# Patient Record
Sex: Male | Born: 1975 | Race: White | Hispanic: No | Marital: Single | Smoking: Current every day smoker
Health system: Southern US, Community
[De-identification: ages and names within clinical notes are randomized; demographics above are authoritative.]

## PROBLEM LIST (undated history)

## (undated) DIAGNOSIS — G2401 Drug induced subacute dyskinesia: Secondary | ICD-10-CM

## (undated) DIAGNOSIS — F209 Schizophrenia, unspecified: Secondary | ICD-10-CM

## (undated) DIAGNOSIS — F1910 Other psychoactive substance abuse, uncomplicated: Secondary | ICD-10-CM

## (undated) DIAGNOSIS — I10 Essential (primary) hypertension: Secondary | ICD-10-CM

---
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
## 2003-12-20 ENCOUNTER — Emergency Department (HOSPITAL_COMMUNITY): Admission: EM | Admit: 2003-12-20 | Discharge: 2003-12-21 | Payer: Self-pay | Admitting: Emergency Medicine

## 2004-04-14 ENCOUNTER — Emergency Department (HOSPITAL_COMMUNITY): Admission: EM | Admit: 2004-04-14 | Discharge: 2004-04-15 | Payer: Self-pay | Admitting: Emergency Medicine

## 2004-04-15 ENCOUNTER — Emergency Department (HOSPITAL_COMMUNITY): Admission: EM | Admit: 2004-04-15 | Discharge: 2004-04-16 | Payer: Self-pay | Admitting: Emergency Medicine

## 2004-04-21 ENCOUNTER — Inpatient Hospital Stay (HOSPITAL_COMMUNITY): Admission: EM | Admit: 2004-04-21 | Discharge: 2004-04-23 | Payer: Self-pay | Admitting: Emergency Medicine

## 2004-04-21 ENCOUNTER — Encounter: Admission: RE | Admit: 2004-04-21 | Discharge: 2004-04-21 | Payer: Self-pay | Admitting: Family Medicine

## 2005-07-31 IMAGING — CT CT PELVIS W/ CM
1 of 4 series · 14 of 32 positions shown, 19 images · IV contrast (APPLIED)
Comparison: none

CLINICAL DATA: nausea and vomiting; abdominal pain
 CT ABDOMEN AND PELVIS WITH CONTRAST
 Multidetector helical CT imaging abdomen and pelvis performed following diluted oral contrast and 453cc Omnipaque 300.  No prior exam for comparison.  
 CT ABDOMEN

[Series 2: abd/pelvis 5.0 b30f · axial · 0.74mm/px · z∈[-498,-43]mm · 14 of 103 slices shown, 19 images]
[im 6/103  soft-tissue]
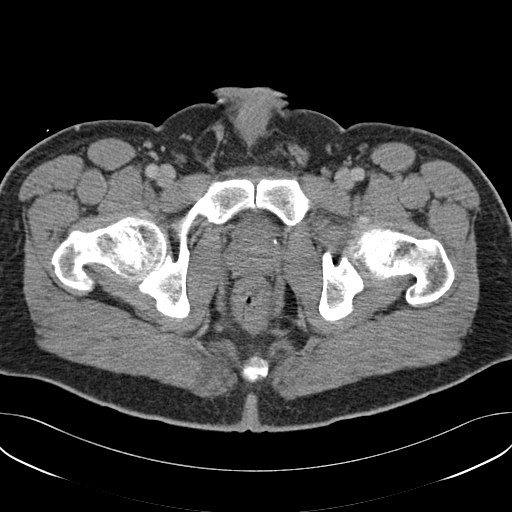
[im 6/103  bone]
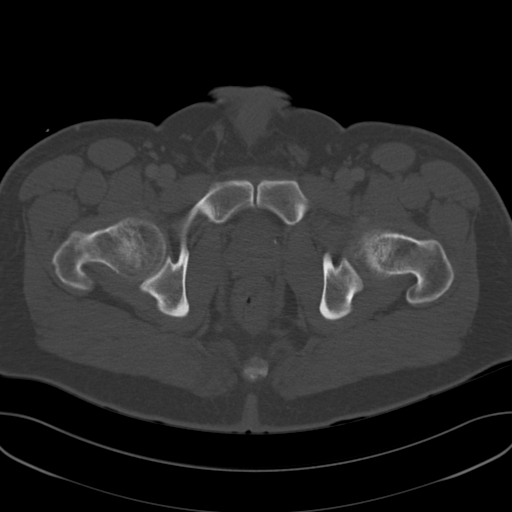
[im 17/103  soft-tissue]
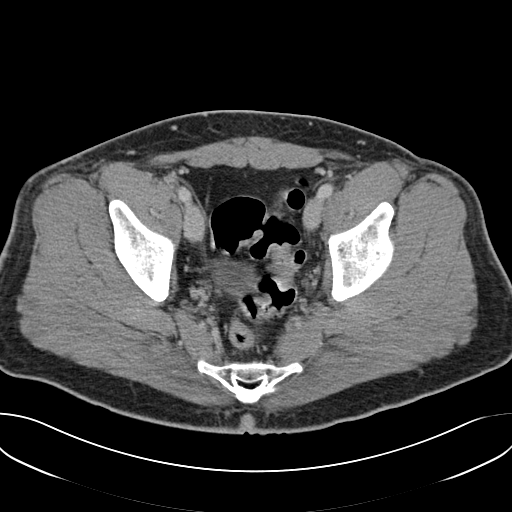
[im 22/103  soft-tissue]
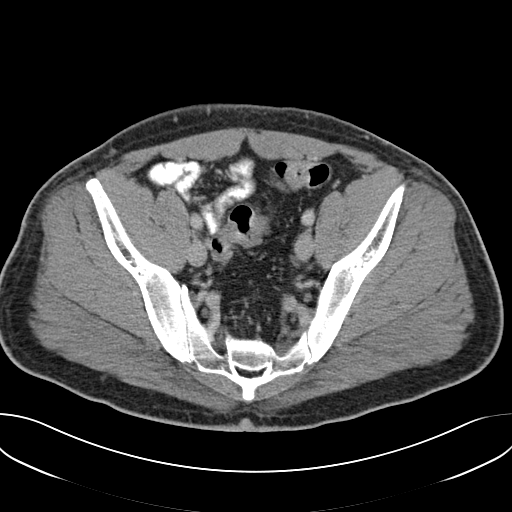
[im 27/103  soft-tissue]
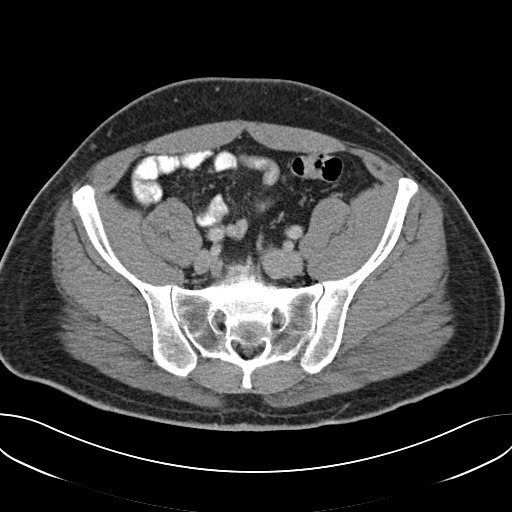
[im 38/103  soft-tissue]
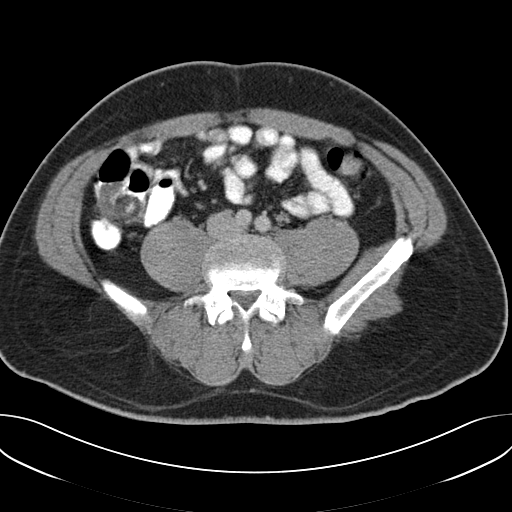
[im 43/103  soft-tissue]
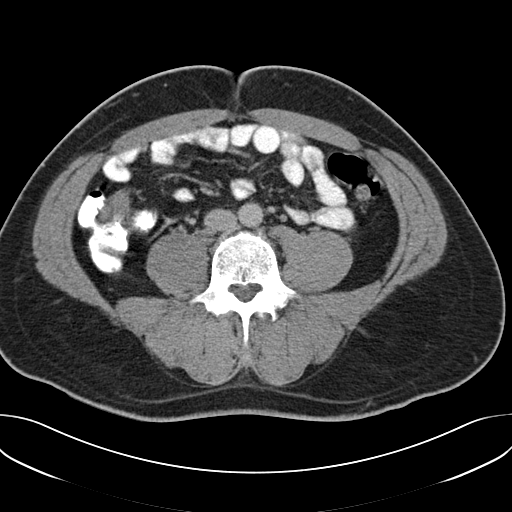
[im 54/103  soft-tissue]
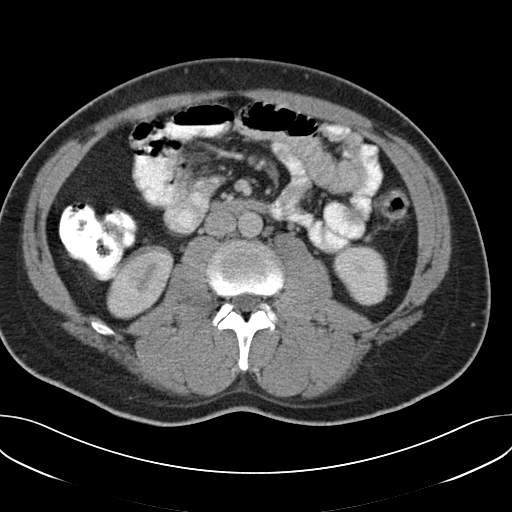
[im 60/103  soft-tissue]
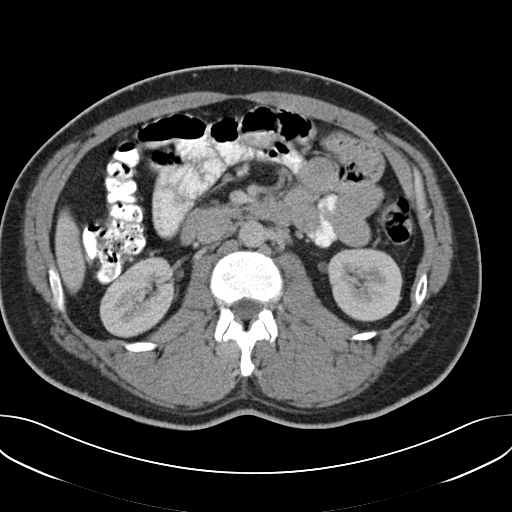
[im 65/103  soft-tissue]
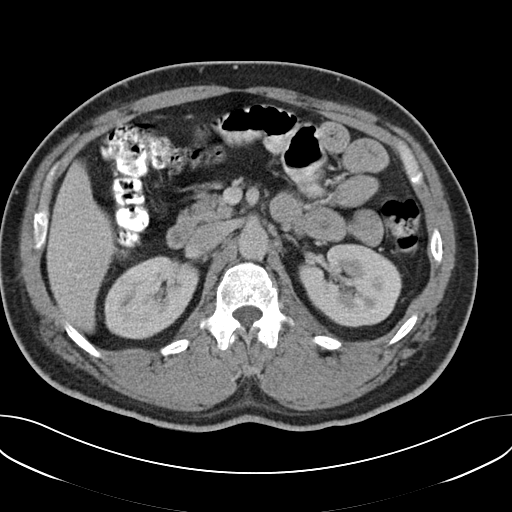
[im 65/103  bone]
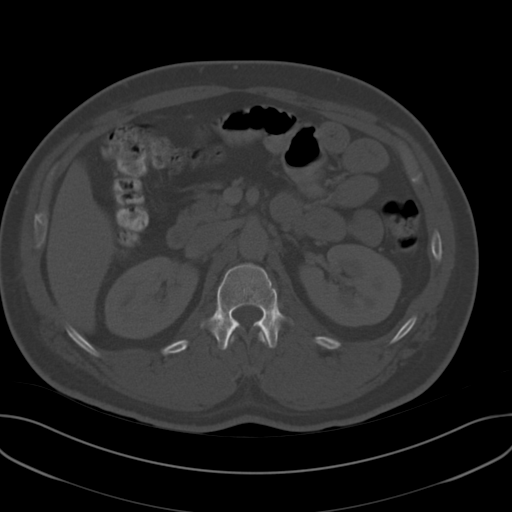
[im 76/103  soft-tissue]
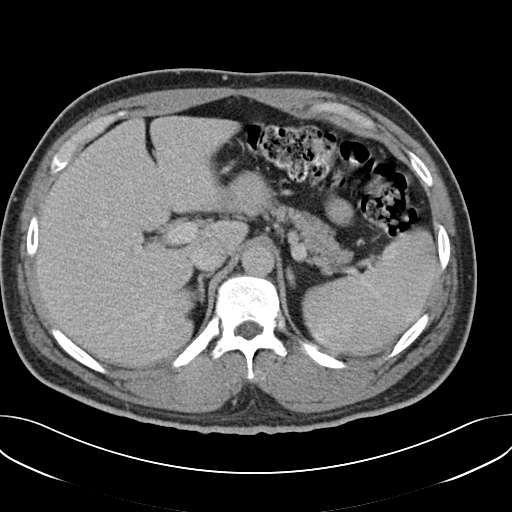
[im 81/103  soft-tissue]
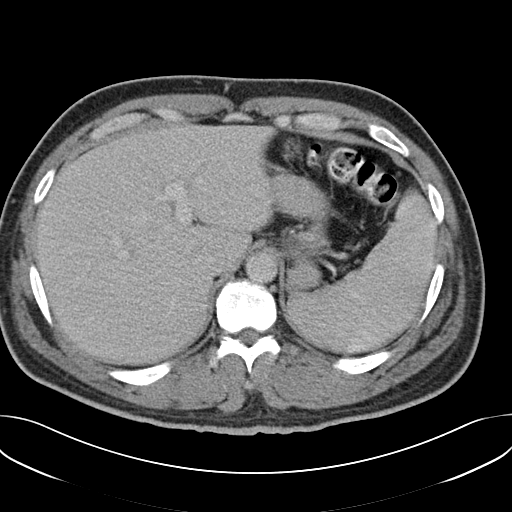
[im 81/103  lung]
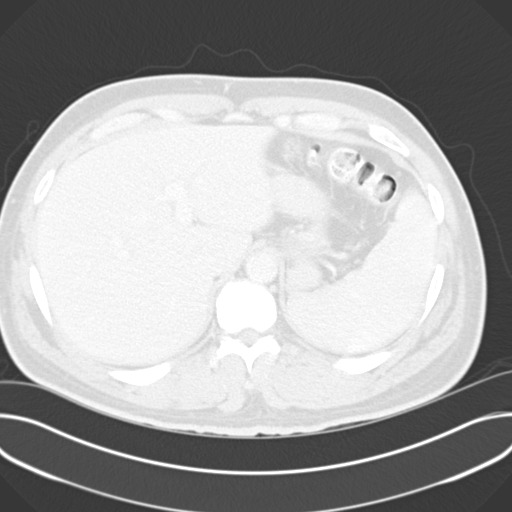
[im 86/103  soft-tissue]
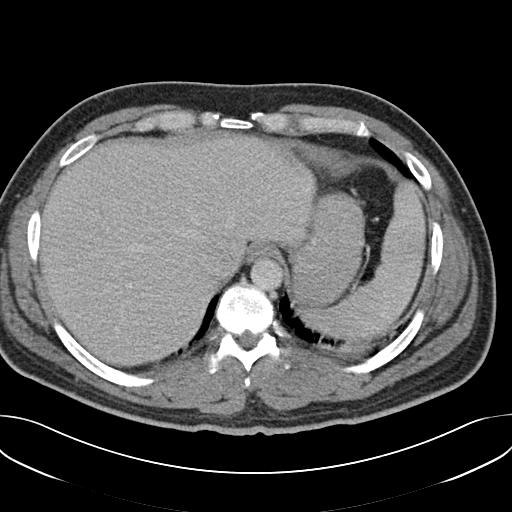
[im 86/103  lung]
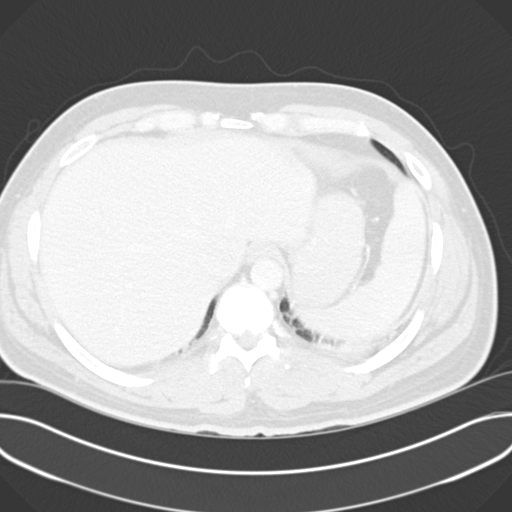
[im 92/103  lung]
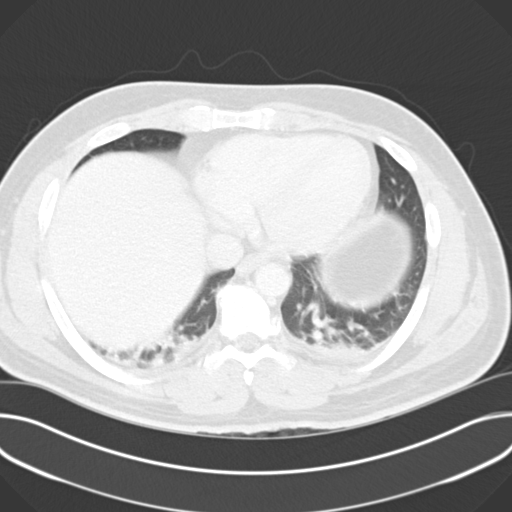
[im 97/103  soft-tissue]
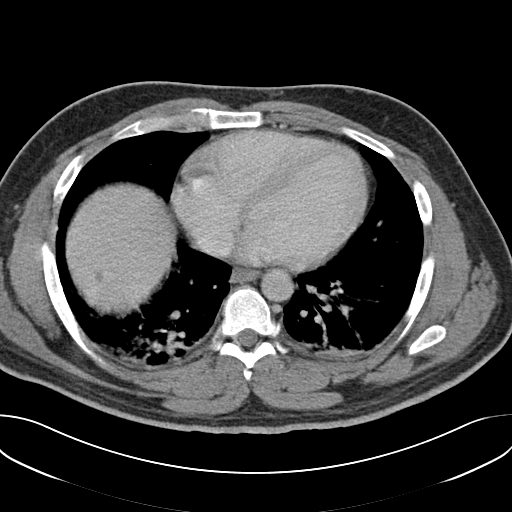
[im 97/103  lung]
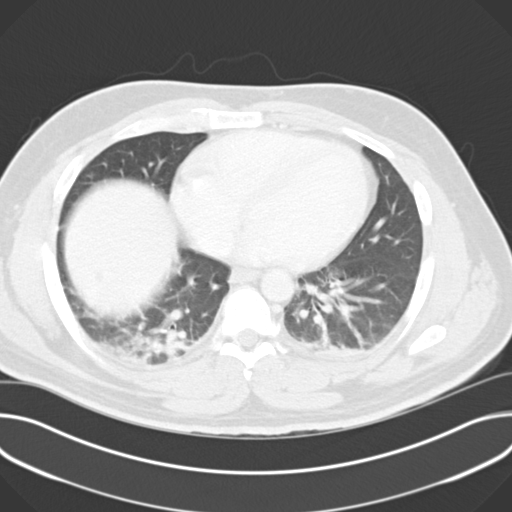

[14 of 32 positions shown; findings below may reference images not displayed]

FINDINGS: Bibasilar atelectasis right lower lobe greater than left lower lobe.  Question mildly inhomogeneous right nephrogram.  Recommend correlation with urinalysis to exclude pyelonephritis.  Liver, spleen, pancreas, kidneys and adrenal glands otherwise unremarkable.  No upper abdominal mass, adenopathy or free fluid.  No inflammatory process seen.
 IMPRESSION
 Bibasilar atelectasis.  Question minimally patchy right nephrogram, recommend correlation with urinalysis to exclude urinary tract infection/pyelonephritis.  
 CT PELVIS
 Normal appendix.  No pelvic mass, adenopathy or free fluid.  Pelvic bowel loops unremarkable.  
 IMPRESSION
 No acute abnormalities.

## 2007-01-31 DIAGNOSIS — G43909 Migraine, unspecified, not intractable, without status migrainosus: Secondary | ICD-10-CM | POA: Insufficient documentation

## 2007-01-31 DIAGNOSIS — I10 Essential (primary) hypertension: Secondary | ICD-10-CM

## 2007-01-31 DIAGNOSIS — K219 Gastro-esophageal reflux disease without esophagitis: Secondary | ICD-10-CM | POA: Insufficient documentation

## 2009-04-06 ENCOUNTER — Emergency Department (HOSPITAL_COMMUNITY): Admission: EM | Admit: 2009-04-06 | Discharge: 2009-04-07 | Payer: Self-pay | Admitting: *Deleted

## 2009-05-07 ENCOUNTER — Emergency Department (HOSPITAL_COMMUNITY): Admission: EM | Admit: 2009-05-07 | Discharge: 2009-05-08 | Payer: Self-pay | Admitting: Emergency Medicine

## 2009-05-08 ENCOUNTER — Inpatient Hospital Stay (HOSPITAL_COMMUNITY): Admission: AD | Admit: 2009-05-08 | Discharge: 2009-05-13 | Payer: Self-pay | Admitting: Psychiatry

## 2009-05-08 ENCOUNTER — Ambulatory Visit: Payer: Self-pay | Admitting: Psychiatry

## 2011-03-13 LAB — COMPREHENSIVE METABOLIC PANEL
ALT: 41 U/L (ref 0–53)
Albumin: 4 g/dL (ref 3.5–5.2)
BUN: 11 mg/dL (ref 6–23)
Calcium: 9.5 mg/dL (ref 8.4–10.5)
GFR calc Af Amer: >60 mL/min (ref >60)
Glucose, Bld: 104 mg/dL — ABNORMAL HIGH (ref 70–99)
Potassium: 4.1 mEq/L (ref 3.5–5.1)
Total Protein: 6.6 g/dL (ref 6.0–8.3)

## 2011-03-13 LAB — DIFFERENTIAL
Eosinophils Absolute: 0.3 10*3/uL (ref 0.0–0.7)
Lymphocytes Relative: 26 % (ref 12–46)
Monocytes Absolute: 0.8 10*3/uL (ref 0.1–1.0)
Monocytes Relative: 8 % (ref 3–12)
Neutro Abs: 6.7 10*3/uL (ref 1.7–7.7)

## 2011-03-13 LAB — PLATELET COUNT: Platelets: 270 10*3/uL (ref 150–400)

## 2011-03-13 LAB — CBC
MCHC: 33.7 g/dL (ref 30.0–36.0)
MCV: 91.5 fL (ref 78.0–100.0)
RBC: 4.39 MIL/uL (ref 4.22–5.81)
RDW: 13.3 % (ref 11.5–15.5)

## 2011-03-13 LAB — RAPID URINE DRUG SCREEN, HOSP PERFORMED
Amphetamines: NOT DETECTED
Tetrahydrocannabinol: NOT DETECTED

## 2011-04-21 NOTE — Discharge Summary (Signed)
NAME:  Cole Henderson, Cole Henderson NO.:  [id number]   MEDICAL RECORD NO.:  [id number]          PATIENT TYPE:  IPS   LOCATION:  0401                          FACILITY:  [facility]   PHYSICIAN:  Anselm Jungling, MD  DATE OF BIRTH:  [birth year]   DATE OF ADMISSION:  05/08/2009  DATE OF DISCHARGE:  05/13/2009                               DISCHARGE SUMMARY   IDENTIFYING DATA AND REASON FOR ADMISSION:  This is an inpatient  psychiatric admission for Cole Henderson, a 35 year old single Caucasian male who  was admitted due to exacerbation of psychotic symptoms.  Please refer to  the admission note for further details pertaining to the symptoms,  circumstances and history that led to his hospitalization.  He was given  an initial Axis I diagnosis of psychosis NOS.   MEDICAL AND LABORATORY:  The patient was medically and physically  assessed by the psychiatric nurse practitioner.  He was continued on his  usual Pepcid 20 mg b.i.d. for GERD.  Otherwise there were no significant  medical issues.   HOSPITAL COURSE:  The patient was admitted to the adult inpatient  psychiatric service.  He presented as a well-nourished, normally-  developed male who was alert, fully oriented, pleasant and cooperative.  He appeared to be nonpsychotic.  He was sad, with depressed affect, but  denied suicidal ideation.  He verbalized a strong desire for help.   He explained that his recent medication change from Geodon to Saphris  did not go well.  He indicated that the change from Geodon to Saphris  had been made in part because of problems with affordability.   Because of the history of a good response to Geodon, Geodon was  restarted and titrated upward to an ultimate dose of 80 mg q.a.m. and  160 mg q.h.s.  The patient reported excellent response to this, and he  was very pleased with the results.   Family session occurred on the day prior to discharge, involving his  parents.  They were very supportive.   Discharge and aftercare plans were  discussed at length.  The patient agreed to the following aftercare  plan.   AFTERCARE:  The patient was to follow up at [facility] with an appointment to see their intake worker on May 18, 2009  at 10:20 a.m.   DISCHARGE MEDICATIONS:  Sinequan 100 mg q.h.s., BuSpar 15 mg q.h.s.  Prolixin decanoate 37.5 mg IM q. 14 days, next May 18, 2009, which was  a continuation of part of his previous regimen.  Tegretol 200 mg twice  daily, Geodon 80 mg q.a.m. and 160 mg q.p.m., Artane 5 mg b.i.d., and  Pepcid 20 mg b.i.d.   DISCHARGE DIAGNOSES:  AXIS I:  Schizophrenia, chronic undifferentiated  type, not otherwise specified.  AXIS II:  Deferred.  AXIS III:  Gastroesophageal reflux disease.  AXIS IV:  Stressors, severe.  AXIS V:  Global assessment of functioning on discharge 65.      Anselm Jungling, MD  Electronically Signed     SPB/MEDQ  D:  05/14/2009  T:  05/14/2009  Job:  [id number]

## 2011-04-21 NOTE — Discharge Summary (Signed)
NAMEGERARD, Cole Henderson NO.:  [id number]   MEDICAL RECORD NO.:  [id number]                   PATIENT TYPE:  INP   LOCATION:  5705                                 FACILITY:  [facility]   PHYSICIAN:  Barney Drain, M.D.                 DATE OF BIRTH:  1976-05-11   DATE OF ADMISSION:  04/21/2004  DATE OF DISCHARGE:  04/23/2004                                 DISCHARGE SUMMARY   ATTENDING PHYSICIAN:  Santiago Bumpers. Hensel, M.D.   DISCHARGE DIAGNOSES:  1. Antral gastritis.  2. Abdominal muscle strain.  3. Anxiety.  4. Tobacco abuse.  5. Hypertension.  6. Hemorrhoids.   DISCHARGE MEDICATIONS:  1. Prilosec OTC one tablet p.o. daily.  2. Vistaril 50 mg p.o. q.8h. p.r.n. severe anxiety.  3. Anusol suppository q.h. night for hemorrhoids.  4. Lisinopril 20 mg one tablet p.o. daily for blood pressure.   SPECIAL INSTRUCTIONS:  1. The patient instructed in supportive therapy for abdominal muscle strain     including cool compresses or heating pad and decreased activity.  2. Diet:  Low acid, decreased spicy and fried, fatty foods.  3. Smoking cessation counseling provided.   DISPOSITION:  The patient discharged home in the care of his family in  stable condition.   FOLLOW UP:  The patient instructed to call on Apr 25, 2004, to schedule a  follow-up appointment with Dr. Olene Floss at the [facility] in three  weeks, telephone number [phone].   BRIEF HISTORY AND PHYSICAL:  Cole Henderson was evaluated in the [facility] when he presented for a new patient visit complaining of two  week duration of intractable nausea and vomiting and one week of diarrhea  with severe right upper quadrant pain.  He had been admitted recently at  [facility] and he was worked up for  pancreatitis, cholecystitis and cholelithiasis. These studies were negative.  The patient states that at the time of presentation, he had had one day  duration  of hematemesis and hematochezia with severe right upper quadrant  pain that radiated to his right back and epigastrium that was worse with  movement or any oral intake.   PHYSICAL EXAMINATION:  GENERAL APPEARANCE:  The patient was poorly kept and  anxious.  VITAL SIGNS:  Temperature 97.5, heart rate 113, blood pressure 117/82.  ABDOMEN:  Normal active bowel sounds, nondistended, soft with right upper  quadrant tenderness without guarding or rebound.  The patient's abdomen was  tender throughout but to a lesser degree.  No hepatosplenomegaly, no masses.  Guaiac was negative.  The patient had normal rectal tone.   PERTINENT LABORATORY DATA:  Findings on admission with lipase 26, amylase  41.  White count 8.4, hemoglobin 14.3.  AST 15, ALT 21.   HOSPITAL COURSE:  PROBLEM #1 -  ABDOMINAL PAIN:  The patient has had recent  negative  abdominal ultrasound, abdominal CT and ERCP.  LFTs, amylase and  lipase were within normal limits.  With this history of alcohol abuse,  gastroesophageal reflux disease and current smoking, with post prandial pain  and negative other work-up, peptic ulcer disease is leading differential  diagnosis.  Therefore gastroenterology was consulted.  Dr. Leone Payor performed  an EGD and anoscopy on Apr 22, 2004.  These studies revealed antral  gastritis and hemorrhoids. Dr. Leone Payor confirmed that proton pump inhibitor  therapy would be appropriate for the patient and also provided [facility] for  the patient's comfort.  Consulted the patient extensively and appropriate  dietary and lifestyle modifications that would be needed to decrease his  gastritis.  The patient did not follow these recommendations even with  regular reminder from nursing staff and myself even during this  hospitalization.   PROBLEM #2 -  ABDOMINAL MUSCLE STRAIN:  The patient continued to complain of  right upper quadrant discomfort; however, when patient was distracted on  physical examination, his  examination was normal and no pain was elicited  with palpation.  Counseled the patient in appropriate supportive therapy for  this complaint.   PROBLEM #3 -  ANXIETY:  The patient has recently lost his job and he is  concerned about his finances.  Routine screening was not consistent with  depression.  The patient had responded well to p.r.n. Vistaril while in the  hospital and I sent him home with a limited amount of Vistaril during this  time of acute stress and referred him to family services for further  counseling.   PROBLEM #4 -  HYPERTENSION:  Well controlled on Lisinopril therapy.  Will  continue.   PROBLEM #5 -  TOBACCO ABUSE:  Counseled the patient on smoking cessation.  The patient is in precontemplation stage.                                                Barney Drain, M.D.    SS/MEDQ  D:  04/23/2004  T:  04/25/2004  Job:  [id number]   cc:   Iva Boop, M.D. [facility] [address]   Chrissie Noa A. Leveda Anna, M.D.  Fax: [phone]

## 2019-08-08 ENCOUNTER — Encounter (HOSPITAL_COMMUNITY): Payer: Self-pay | Admitting: *Deleted

## 2019-08-08 ENCOUNTER — Other Ambulatory Visit: Payer: Self-pay

## 2019-08-08 ENCOUNTER — Emergency Department (HOSPITAL_COMMUNITY)
Admission: EM | Admit: 2019-08-08 | Discharge: 2019-08-08 | Disposition: A | Discharge status: Home / Self Care | Payer: Self-pay | Attending: Emergency Medicine | Admitting: Emergency Medicine

## 2019-08-08 DIAGNOSIS — F17200 Nicotine dependence, unspecified, uncomplicated: Secondary | ICD-10-CM | POA: Insufficient documentation

## 2019-08-08 DIAGNOSIS — I10 Essential (primary) hypertension: Secondary | ICD-10-CM | POA: Insufficient documentation

## 2019-08-08 DIAGNOSIS — R51 Headache: Secondary | ICD-10-CM | POA: Insufficient documentation

## 2019-08-08 DIAGNOSIS — R519 Headache, unspecified: Secondary | ICD-10-CM

## 2019-08-08 DIAGNOSIS — H6122 Impacted cerumen, left ear: Secondary | ICD-10-CM | POA: Insufficient documentation

## 2019-08-08 DIAGNOSIS — F419 Anxiety disorder, unspecified: Secondary | ICD-10-CM | POA: Insufficient documentation

## 2019-08-08 HISTORY — DX: Essential (primary) hypertension: I10

## 2019-08-08 HISTORY — DX: Drug induced subacute dyskinesia: G24.01

## 2019-08-08 HISTORY — DX: Schizophrenia, unspecified: F20.9

## 2019-08-08 MED ORDER — DIPHENHYDRAMINE HCL 50 MG/ML IJ SOLN
25.0000 mg | Freq: Once | INTRAMUSCULAR | Status: AC
  Administered 2019-08-08: 12:00:00 25 mg via INTRAVENOUS
  Filled 2019-08-08: qty 1

## 2019-08-08 MED ORDER — METOCLOPRAMIDE HCL 5 MG/ML IJ SOLN
10.0000 mg | Freq: Once | INTRAMUSCULAR | Status: AC
  Administered 2019-08-08: 10 mg via INTRAVENOUS
  Filled 2019-08-08: qty 2

## 2019-08-08 NOTE — ED Notes (Signed)
Patient verbalizes understanding of discharge instructions. Opportunity for questioning and answers were provided. Armband removed by staff, pt discharged from ED.  

## 2019-08-08 NOTE — ED Triage Notes (Signed)
Pt states blood out of L ear 2 days ago and since then has had migraine and hasn't been able to sleep.

## 2019-08-08 NOTE — ED Provider Notes (Signed)
[facility] EMERGENCY DEPARTMENT Provider Note   CSN: [id number] Arrival date & time: 08/08/19  1020     History   Chief Complaint Chief Complaint  Patient presents with  . Migraine    HPI Cole Henderson is a 43 y.o. male with history of hypertension, schizophrenia, paranoid personality disorder, tardive dyskinesia, migraine presents today for headache.  Patient reports that he was just released this week from prison, he reports he was incarcerated for multiple years.  He reports that Wednesday morning 08/06/2019 he was woken up by his daughter who noticed a small amount of blood on his left ear.  Patient reports he also had a headache when he woke up that morning.  He reports that this headache is consistent with his normal migraines a frontal throbbing sensation constant moderate intensity without clear aggravating or alleviating factors.  He took his propranolol for migraine treatment without improvement.  He reports he is also tried ibuprofen OTC without improvement.  Patient reports that this headache is consistent with his typical migraines and does not have any abnormal factors to it.  He is requesting migraine cocktail today.  Of note patient does report being somewhat anxious upon ED arrival, he states this is to having not been in public for many years.  Patient denies fevers/chills, injury/trauma, neck stiffness, vision changes, difficulty speaking, difficulty swallowing, chest pain/shortness of breath, cough, abdominal pain, nausea/vomiting, diarrhea, numbness/weakness, tingling or any additional concerns today.    HPI  Past Medical History:  Diagnosis Date  . Hypertension   . Schizophrenia (HCC)   . Tardive dyskinesia     Patient Active Problem List   Diagnosis Date Noted  . MIGRAINE, UNSPEC., W/O INTRACTABLE MIGRAINE 01/31/2007  . HYPERTENSION, BENIGN SYSTEMIC 01/31/2007  . GASTROESOPHAGEAL REFLUX, NO ESOPHAGITIS 01/31/2007    History reviewed. No  pertinent surgical history.      Home Medications    Prior to Admission medications   Not on File    Family History No family history on file.  Social History Social History   Tobacco Use  . Smoking status: Current Every Day Smoker    Packs/day: 0.50  . Smokeless tobacco: Never Used  Substance Use Topics  . Alcohol use: Not Currently  . Drug use: Not Currently     Allergies   Cogentin [benztropine], Haldol [haloperidol lactate], and Keflex [cephalexin]   Review of Systems Review of Systems Ten systems are reviewed and are negative for acute change except as noted in the HPI  Physical Exam Updated Vital Signs BP (!) 142/92   Pulse 97   Temp 98.7 F (37.1 C) (Oral)   Resp 16   SpO2 97%   Physical Exam Constitutional:      General: He is not in acute distress.    Appearance: Normal appearance. He is well-developed. He is not ill-appearing or diaphoretic.  HENT:     Head: Normocephalic and atraumatic. No raccoon eyes or Battle's sign.     Jaw: There is normal jaw occlusion. No trismus.     Right Ear: Tympanic membrane, ear canal and external ear normal. No tenderness. No hemotympanum.     Left Ear: Tympanic membrane, ear canal and external ear normal. No tenderness. No hemotympanum.     Ears:     Comments: Initially cerumen impaction of left ear, irrigated by nursing staff.  Post irrigation visual inspection of the tympanic membrane reveals intact TM without bleeding.    Nose: Nose normal. No rhinorrhea.  Right Nostril: No epistaxis.     Left Nostril: No epistaxis.     Mouth/Throat:     Mouth: Mucous membranes are moist.     Pharynx: Oropharynx is clear.  Eyes:     General: Vision grossly intact. Gaze aligned appropriately.     Extraocular Movements: Extraocular movements intact.     Conjunctiva/sclera: Conjunctivae normal.     Pupils: Pupils are equal, round, and reactive to light.     Comments: Visual fields grossly intact bilaterally  Neck:      Musculoskeletal: Full passive range of motion without pain, normal range of motion and neck supple.     Trachea: Trachea and phonation normal. No tracheal deviation.     Meningeal: Brudzinski's sign absent.  Cardiovascular:     Rate and Rhythm: Normal rate and regular rhythm.     Pulses:          Dorsalis pedis pulses are 2+ on the right side and 2+ on the left side.     Heart sounds: Normal heart sounds.  Pulmonary:     Effort: Pulmonary effort is normal. No accessory muscle usage or respiratory distress.     Breath sounds: Normal breath sounds and air entry.  Abdominal:     General: There is no distension.     Palpations: Abdomen is soft.     Tenderness: There is no abdominal tenderness. There is no guarding or rebound.  Musculoskeletal: Normal range of motion.  Feet:     Right foot:     Protective Sensation: 3 sites tested. 3 sites sensed.     Left foot:     Protective Sensation: 3 sites tested. 3 sites sensed.  Skin:    General: Skin is warm and dry.  Neurological:     Mental Status: He is alert.     GCS: GCS eye subscore is 4. GCS verbal subscore is 5. GCS motor subscore is 6.     Comments: Mental Status: Alert, oriented, thought content appropriate, able to give a coherent history. Speech fluent without evidence of aphasia. Able to follow 2 step commands without difficulty. Cranial Nerves: II: Peripheral visual fields grossly normal, pupils equal, round, reactive to light III,IV, VI: ptosis not present, extra-ocular motions intact bilaterally V,VII: smile symmetric, eyebrows raise symmetric, facial light touch sensation equal VIII: hearing grossly normal to voice X: uvula elevates symmetrically XI: bilateral shoulder shrug symmetric and strong XII: midline tongue extension without fassiculations Motor: Normal tone. 5/5 strength in upper and lower extremities bilaterally including strong and equal grip strength and dorsiflexion/plantar flexion Sensory: Sensation  intact to light touch in all extremities.Negative Romberg.  Deep Tendon Reflexes: 2+ and symmetric patella Cerebellar: normal finger-to-nose with bilateral upper extremities. Normal heel-to -shin balance bilaterally of the lower extremity. No pronator drift.  Gait: normal gait and balance CV: distal pulses palpable throughout  Psychiatric:        Behavior: Behavior normal. Behavior is cooperative.        Thought Content: Thought content does not include homicidal or suicidal ideation.     Comments: Pleasant    ED Treatments / Results  Labs (all labs ordered are listed, but only abnormal results are displayed) Labs Reviewed - No data to display  EKG None  Radiology No results found.  Procedures Procedures (including critical care time)  Medications Ordered in ED Medications  metoCLOPramide (REGLAN) injection 10 mg (10 mg Intravenous Given 08/08/19 1216)  diphenhydrAMINE (BENADRYL) injection 25 mg (25 mg Intravenous Given 08/08/19 1216)  Initial Impression / Assessment and Plan / ED Course  I have reviewed the triage vital signs and the nursing notes.  Pertinent labs & imaging results that were available during my care of the patient were reviewed by me and considered in my medical decision making (see chart for details).   Cole Henderson is a 43 y.o. male who presents to ED for headache that began 2 days ago. Patient states that their headache is consistent with their typical migraine and denies any abnormal features. Physical Examination reassuring and without focal neuro deficits.  Patient denies sudden onset, syncope, trauma, neck stiffness.  He has no pain about the temporal or carotid/facial arteries.  He has no meningeal signs and is overall well-appearing on examination.  Patient does report some anxiety being in public again for the first time in many years.  Suspect this as cause of patient's tachycardia today, do not suspect infection as etiology of tachycardia.  Will  treat patient with migraine cocktail and reassess.  No indication for imaging at this time. --------- Imaging performed: None - IV Reglan and Benadryl given. - Patient reassessed he is fully dressed sitting in chair requesting discharge.  He reports that he took a nap shortly after receiving his medications and then when he woke up his headache was completely resolved.  He reports that he is having no pain at this time and is ready to go home.  On re-evaluation, patient well-appearing and in no acute distress. The patient denies any neurologic symptoms such as visual changes, focal numbness/weakness, balance problems, confusion, or speech difficulty to suggest a life-threatening intracranial process such as intracranial hemorrhage or mass. The patient has no clotting risk factors thus venous sinus thrombosis is unlikely. No fevers, neck pain or nuchal rigidity to suggest meningitis. Patient is afebrile, non-toxic and well appearing. Reassuring neuro exam, normal gait around room and back.  PCP follow up encouraged. I have reviewed return precautions including development of symptoms abnormal from typical migraine, fever, nausea/vomiting or neurologic symptoms, vision changes, confusion, lethargy, difficulty speaking/walking, or other new/worsening/concerning symptoms. Patient states understanding of return precautions.  Additionally TM intact on reexamination, no signs of otitis externa, otitis media, mastoiditis or other acute processes at this time.  Additionally on reevaluation patient appears less anxious he is comfortable and in no acute distress.  Tachycardia has improved following headache improvement.  Patient denies any homicidal or suicidal ideations, he is fully alert and oriented and does not appear to be a danger to himself or others at this time.  At this time there does not appear to be any evidence of an acute emergency medical condition and the patient appears stable for discharge with  appropriate outpatient follow up. Diagnosis was discussed with patient who verbalizes understanding of care plan and is agreeable to discharge. I have discussed return precautions with patient who verbalizes understanding of return precautions. Patient encouraged to follow-up with their PCP. All questions answered.  Patient has been discharged in good condition.  Patient's case discussed with Dr. Jeanell Sparrow who agrees with plan to discharge with outpatient follow-up.   Note: Portions of this report may have been transcribed using voice recognition software. Every effort was made to ensure accuracy; however, inadvertent computerized transcription errors may still be present. Final Clinical Impressions(s) / ED Diagnoses   Final diagnoses:  Nonintractable headache, unspecified chronicity pattern, unspecified headache type    ED Discharge Orders    None       Deliah Boston, PA-C 08/08/19 1339  Margarita Grizzleay, Danielle, MD 08/08/19 [phone]

## 2019-08-08 NOTE — Discharge Instructions (Signed)
You have been diagnosed today with headache.  At this time there does not appear to be the presence of an emergent medical condition, however there is always the potential for conditions to change. Please read and follow the below instructions.  Please return to the Emergency Department immediately for any new or worsening symptoms. Please be sure to follow up with your Primary Care Provider within one week regarding your visit today; please call their office to schedule an appointment even if you are feeling better for a follow-up visit. Please drink only water and get plenty of rest.  Take your home medications as prescribed by your primary care provider.  Get help right away if: Your headache is different than your normal migraine in any way. Your headache gets worse after a lot of physical activity. You keep throwing up. You have a stiff neck. You have trouble seeing. You have trouble speaking. You have pain in the eye or ear. Your muscles are weak or you lose muscle control. You lose your balance or have trouble walking. You feel like you will pass out (faint) or you pass out. You are mixed up (confused). You have a seizure. You have fever or chills You have any new/concerning or worsening symptoms  Please read the additional information packets attached to your discharge summary.

## 2020-02-24 ENCOUNTER — Emergency Department (HOSPITAL_COMMUNITY)
Admission: EM | Admit: 2020-02-24 | Discharge: 2020-02-24 | Disposition: A | Discharge status: Home / Self Care | Payer: Self-pay | Attending: Emergency Medicine | Admitting: Emergency Medicine

## 2020-02-24 ENCOUNTER — Encounter (HOSPITAL_COMMUNITY): Payer: Self-pay | Admitting: Emergency Medicine

## 2020-02-24 ENCOUNTER — Other Ambulatory Visit: Payer: Self-pay

## 2020-02-24 DIAGNOSIS — F259 Schizoaffective disorder, unspecified: Secondary | ICD-10-CM | POA: Insufficient documentation

## 2020-02-24 DIAGNOSIS — R1110 Vomiting, unspecified: Secondary | ICD-10-CM | POA: Insufficient documentation

## 2020-02-24 DIAGNOSIS — G43009 Migraine without aura, not intractable, without status migrainosus: Secondary | ICD-10-CM

## 2020-02-24 DIAGNOSIS — I10 Essential (primary) hypertension: Secondary | ICD-10-CM | POA: Insufficient documentation

## 2020-02-24 DIAGNOSIS — F17210 Nicotine dependence, cigarettes, uncomplicated: Secondary | ICD-10-CM | POA: Insufficient documentation

## 2020-02-24 LAB — CBC
HCT: 38.2 % — ABNORMAL LOW (ref 39.0–52.0)
Hemoglobin: 11.7 g/dL — ABNORMAL LOW (ref 13.0–17.0)
MCH: 25.2 pg — ABNORMAL LOW (ref 26.0–34.0)
MCHC: 30.6 g/dL (ref 30.0–36.0)
MCV: 82.2 fL (ref 80.0–100.0)
Platelets: 302 10*3/uL (ref 150–400)
RBC: 4.65 MIL/uL (ref 4.22–5.81)
RDW: 16.1 % — ABNORMAL HIGH (ref 11.5–15.5)
WBC: 8.6 10*3/uL (ref 4.0–10.5)
nRBC: 0 % (ref 0.0–0.2)

## 2020-02-24 LAB — BASIC METABOLIC PANEL
Anion gap: 10 (ref 5–15)
BUN: 9 mg/dL (ref 6–20)
CO2: 31 mmol/L (ref 22–32)
Calcium: 9.3 mg/dL (ref 8.9–10.3)
Chloride: 98 mmol/L (ref 98–111)
Creatinine, Ser: 0.76 mg/dL (ref 0.61–1.24)
GFR calc Af Amer: >60 mL/min (ref >60)
GFR calc non Af Amer: >60 mL/min (ref >60)
Glucose, Bld: 110 mg/dL — ABNORMAL HIGH (ref 70–99)
Potassium: 3.9 mmol/L (ref 3.5–5.1)
Sodium: 139 mmol/L (ref 135–145)

## 2020-02-24 MED ORDER — ONDANSETRON 4 MG PO TBDP
4.0000 mg | ORAL_TABLET | Freq: Once | ORAL | Status: AC | PRN
  Administered 2020-02-24: 4 mg via ORAL
  Filled 2020-02-24: qty 1

## 2020-02-24 MED ORDER — PROCHLORPERAZINE EDISYLATE 10 MG/2ML IJ SOLN
10.0000 mg | Freq: Once | INTRAMUSCULAR | Status: AC
  Administered 2020-02-24: 10 mg via INTRAVENOUS
  Filled 2020-02-24: qty 2

## 2020-02-24 MED ORDER — KETOROLAC TROMETHAMINE 30 MG/ML IJ SOLN
30.0000 mg | Freq: Once | INTRAMUSCULAR | Status: AC
Start: 2020-02-24 — End: 2020-02-24
  Administered 2020-02-24: 30 mg via INTRAVENOUS
  Filled 2020-02-24: qty 1

## 2020-02-24 MED ORDER — TRIHEXYPHENIDYL HCL 5 MG PO TABS: 5.0000 mg | ORAL_TABLET | Freq: Three times a day (TID) | ORAL | 0 refills | Status: DC

## 2020-02-24 MED ORDER — DIPHENHYDRAMINE HCL 50 MG/ML IJ SOLN
25.0000 mg | Freq: Once | INTRAMUSCULAR | Status: AC
  Administered 2020-02-24: 25 mg via INTRAVENOUS
  Filled 2020-02-24: qty 1

## 2020-02-24 MED ORDER — SODIUM CHLORIDE 0.9 % IV BOLUS
1000.0000 mL | Freq: Once | INTRAVENOUS | Status: AC
  Administered 2020-02-24: 1000 mL via INTRAVENOUS

## 2020-02-24 NOTE — Discharge Instructions (Addendum)
Return here as need. Follow up with your doctor.

## 2020-02-24 NOTE — ED Provider Notes (Signed)
[facility] EMERGENCY DEPARTMENT Provider Note   CSN: [id number] Arrival date & time: 02/24/20  1418     History Chief Complaint  Patient presents with  . Migraine    Cole Henderson is a 44 y.o. male.  HPI Patient presents to the emergency department with a migraine headache that started around 145 today.  The patient states that he has a history of migraines and this feels similar to the previous episodes.  The patient states that he did vomit 3 times since the headache started.  The patient states he did have some light sensitivity.  Patient states he did take some ibuprofen and Tylenol without relief.  Patient states that the constant frontal headache.  The patient states that he does not take any medications chronically for migraines.  The patient denies chest pain, shortness of breath,blurred vision, neck pain, fever, cough, weakness, numbness, dizziness, anorexia, edema, abdominal pain, nausea, vomiting, diarrhea, rash, back pain, dysuria, hematemesis, bloody stool, near syncope, or syncope.    Past Medical History:  Diagnosis Date  . Hypertension   . Schizophrenia ([place])   . Tardive dyskinesia     Patient Active Problem List   Diagnosis Date Noted  . MIGRAINE, UNSPEC., W/O INTRACTABLE MIGRAINE 01/31/2007  . HYPERTENSION, BENIGN SYSTEMIC 01/31/2007  . GASTROESOPHAGEAL REFLUX, NO ESOPHAGITIS 01/31/2007    History reviewed. No pertinent surgical history.     History reviewed. No pertinent family history.  Social History   Tobacco Use  . Smoking status: Current Every Day Smoker    Packs/day: 0.50  . Smokeless tobacco: Never Used  Substance Use Topics  . Alcohol use: Not Currently  . Drug use: Not Currently    Home Medications Prior to Admission medications   Medication Sig Start Date End Date Taking? Authorizing Provider  trihexyphenidyl (ARTANE) 5 MG tablet Take 1 tablet (5 mg total) by mouth 3 (three) times daily with meals. 02/24/20   Avaley Coop,  Harrell Gave, PA-C    Allergies    Cogentin [benztropine], Haldol [haloperidol lactate], and Keflex [cephalexin]  Review of Systems   Review of Systems All other systems negative except as documented in the HPI. All pertinent positives and negatives as reviewed in the HPI. Physical Exam Updated Vital Signs BP (!) 165/103   Pulse 98   Temp 98.1 F (36.7 C) (Oral)   Resp 18   Ht 5\' 11"  (1.803 m)   Wt 88.9 kg   SpO2 100%   BMI 27.34 kg/m   Physical Exam Vitals and nursing note reviewed.  Constitutional:      General: He is not in acute distress.    Appearance: He is well-developed.  HENT:     Head: Normocephalic and atraumatic.  Eyes:     Pupils: Pupils are equal, round, and reactive to light.  Cardiovascular:     Rate and Rhythm: Normal rate and regular rhythm.     Heart sounds: Normal heart sounds. No murmur. No friction rub. No gallop.   Pulmonary:     Effort: Pulmonary effort is normal. No respiratory distress.     Breath sounds: Normal breath sounds. No wheezing.  Musculoskeletal:     Cervical back: Normal range of motion and neck supple.  Skin:    General: Skin is warm and dry.     Capillary Refill: Capillary refill takes less than 2 seconds.     Findings: No erythema or rash.  Neurological:     Mental Status: He is alert and oriented to person, place,  and time.     Motor: No weakness or abnormal muscle tone.     Coordination: Coordination normal.     Gait: Gait normal.  Psychiatric:        Behavior: Behavior normal.     ED Results / Procedures / Treatments   Labs (all labs ordered are listed, but only abnormal results are displayed) Labs Reviewed  BASIC METABOLIC PANEL - Abnormal; Notable for the following components:      Result Value   Glucose, Bld 110 (*)    All other components within normal limits  CBC - Abnormal; Notable for the following components:   Hemoglobin 11.7 (*)    HCT 38.2 (*)    MCH 25.2 (*)    RDW 16.1 (*)    All other components  within normal limits    EKG None  Radiology No results found.  Procedures Procedures (including critical care time)  Medications Ordered in ED Medications  ondansetron (ZOFRAN-ODT) disintegrating tablet 4 mg (4 mg Oral Given 02/24/20 1512)  sodium chloride 0.9 % bolus 1,000 mL (0 mLs Intravenous Stopped 02/24/20 1811)  ketorolac (TORADOL) 30 MG/ML injection 30 mg (30 mg Intravenous Given 02/24/20 1657)  diphenhydrAMINE (BENADRYL) injection 25 mg (25 mg Intravenous Given 02/24/20 1656)  prochlorperazine (COMPAZINE) injection 10 mg (10 mg Intravenous Given 02/24/20 1659)    ED Course  I have reviewed the triage vital signs and the nursing notes.  Pertinent labs & imaging results that were available during my care of the patient were reviewed by me and considered in my medical decision making (see chart for details).    MDM Rules/Calculators/A&P                      Patient is feeling dramatically better following IV fluids and migraine cocktail of Toradol, Compazine and Benadryl.  Vies the patient to return here for any worsening in his condition.  Patient agrees the plan and all questions were answered.  I did advise him to follow-up with a primary doctor soon as possible. Final Clinical Impression(s) / ED Diagnoses Final diagnoses:  Migraine without aura and without status migrainosus, not intractable    Rx / DC Orders ED Discharge Orders         Ordered    trihexyphenidyl (ARTANE) 5 MG tablet  3 times daily with meals     02/24/20 1822           Charlestine Night, PA-C 02/24/20 2333    Eber Hong, MD 02/25/20 [phone]

## 2020-02-24 NOTE — ED Triage Notes (Signed)
Pt arrives to ED from his recovery house with complaints of a migraine at 1345 today. Patient with hx of schizophrenia and tardive dyskinesia.  Patient states that he has vomited three times since the migraine started. Patient states he has taken tylenol and ibuprofen today without relief. Patient states this headache is constant and is consistent with his normal headaches.

## 2020-02-24 NOTE — ED Notes (Signed)
Pt given turkey sandwich and coke 

## 2020-08-28 ENCOUNTER — Ambulatory Visit (HOSPITAL_COMMUNITY)
Admission: EM | Admit: 2020-08-28 | Discharge: 2020-08-28 | Disposition: A | Discharge status: Still In Hospital | Payer: No Payment, Other | Attending: Family | Admitting: Family

## 2020-08-28 ENCOUNTER — Other Ambulatory Visit: Payer: Self-pay

## 2020-08-28 ENCOUNTER — Emergency Department (HOSPITAL_COMMUNITY)
Admission: EM | Admit: 2020-08-28 | Discharge: 2020-08-29 | Disposition: A | Discharge status: Home / Self Care | Payer: Self-pay | Attending: Emergency Medicine | Admitting: Emergency Medicine

## 2020-08-28 ENCOUNTER — Encounter (HOSPITAL_COMMUNITY): Payer: Self-pay | Admitting: *Deleted

## 2020-08-28 ENCOUNTER — Encounter (HOSPITAL_COMMUNITY): Payer: Self-pay

## 2020-08-28 ENCOUNTER — Emergency Department (HOSPITAL_COMMUNITY)
Admission: EM | Admit: 2020-08-28 | Discharge: 2020-08-28 | Disposition: A | Discharge status: Home / Self Care | Payer: Self-pay | Attending: Emergency Medicine | Admitting: Emergency Medicine

## 2020-08-28 DIAGNOSIS — Z79899 Other long term (current) drug therapy: Secondary | ICD-10-CM | POA: Insufficient documentation

## 2020-08-28 DIAGNOSIS — F19959 Other psychoactive substance use, unspecified with psychoactive substance-induced psychotic disorder, unspecified: Secondary | ICD-10-CM | POA: Insufficient documentation

## 2020-08-28 DIAGNOSIS — R519 Headache, unspecified: Secondary | ICD-10-CM | POA: Insufficient documentation

## 2020-08-28 DIAGNOSIS — G2401 Drug induced subacute dyskinesia: Secondary | ICD-10-CM | POA: Diagnosis not present

## 2020-08-28 DIAGNOSIS — I10 Essential (primary) hypertension: Secondary | ICD-10-CM | POA: Insufficient documentation

## 2020-08-28 DIAGNOSIS — F17200 Nicotine dependence, unspecified, uncomplicated: Secondary | ICD-10-CM | POA: Insufficient documentation

## 2020-08-28 DIAGNOSIS — Z20822 Contact with and (suspected) exposure to covid-19: Secondary | ICD-10-CM | POA: Insufficient documentation

## 2020-08-28 DIAGNOSIS — R252 Cramp and spasm: Secondary | ICD-10-CM | POA: Insufficient documentation

## 2020-08-28 DIAGNOSIS — F1920 Other psychoactive substance dependence, uncomplicated: Secondary | ICD-10-CM

## 2020-08-28 DIAGNOSIS — F1520 Other stimulant dependence, uncomplicated: Secondary | ICD-10-CM | POA: Insufficient documentation

## 2020-08-28 HISTORY — DX: Other psychoactive substance abuse, uncomplicated: F19.10

## 2020-08-28 LAB — CBC WITH DIFFERENTIAL/PLATELET
Abs Immature Granulocytes: 0.02 10*3/uL (ref 0.00–0.07)
Basophils Absolute: 0.1 10*3/uL (ref 0.0–0.1)
Basophils Relative: 1 %
Eosinophils Absolute: 0.2 10*3/uL (ref 0.0–0.5)
Eosinophils Relative: 2 %
HCT: 42.7 % (ref 39.0–52.0)
Hemoglobin: 13.5 g/dL (ref 13.0–17.0)
Immature Granulocytes: 0 %
Lymphocytes Relative: 26 %
Lymphs Abs: 2.5 10*3/uL (ref 0.7–4.0)
MCH: 27.8 pg (ref 26.0–34.0)
MCHC: 31.6 g/dL (ref 30.0–36.0)
MCV: 87.9 fL (ref 80.0–100.0)
Monocytes Absolute: 0.6 10*3/uL (ref 0.1–1.0)
Monocytes Relative: 6 %
Neutro Abs: 6.2 10*3/uL (ref 1.7–7.7)
Neutrophils Relative %: 65 %
Platelets: 365 10*3/uL (ref 150–400)
RBC: 4.86 MIL/uL (ref 4.22–5.81)
RDW: 15 % (ref 11.5–15.5)
WBC: 9.6 10*3/uL (ref 4.0–10.5)
nRBC: 0 % (ref 0.0–0.2)

## 2020-08-28 LAB — BASIC METABOLIC PANEL
Anion gap: 11 (ref 5–15)
BUN: 12 mg/dL (ref 6–20)
CO2: 25 mmol/L (ref 22–32)
Calcium: 9.4 mg/dL (ref 8.9–10.3)
Chloride: 103 mmol/L (ref 98–111)
Creatinine, Ser: 0.86 mg/dL (ref 0.61–1.24)
GFR calc Af Amer: >60 mL/min (ref >60)
GFR calc non Af Amer: >60 mL/min (ref >60)
Glucose, Bld: 134 mg/dL — ABNORMAL HIGH (ref 70–99)
Potassium: 3.9 mmol/L (ref 3.5–5.1)
Sodium: 139 mmol/L (ref 135–145)

## 2020-08-28 LAB — RESPIRATORY PANEL BY RT PCR (FLU A&B, COVID)
Influenza A by PCR: NEGATIVE
Influenza B by PCR: NEGATIVE
SARS Coronavirus 2 by RT PCR: NEGATIVE

## 2020-08-28 MED ORDER — SODIUM CHLORIDE 0.9 % IV BOLUS
1000.0000 mL | Freq: Once | INTRAVENOUS | Status: AC
  Administered 2020-08-28: 1000 mL via INTRAVENOUS

## 2020-08-28 MED ORDER — ACETAMINOPHEN 325 MG PO TABS
650.0000 mg | ORAL_TABLET | Freq: Once | ORAL | Status: AC
  Administered 2020-08-28: 650 mg via ORAL
  Filled 2020-08-28: qty 2

## 2020-08-28 MED ORDER — MAGNESIUM HYDROXIDE 400 MG/5ML PO SUSP: 30.0000 mL | Freq: Every day | ORAL | Status: DC | PRN

## 2020-08-28 MED ORDER — LISINOPRIL 20 MG PO TABS
40.0000 mg | ORAL_TABLET | Freq: Once | ORAL | Status: AC
  Administered 2020-08-28: 40 mg via ORAL
  Filled 2020-08-28: qty 4

## 2020-08-28 MED ORDER — TRAZODONE HCL 50 MG PO TABS: 50.0000 mg | ORAL_TABLET | Freq: Every evening | ORAL | Status: DC | PRN

## 2020-08-28 MED ORDER — ALUM & MAG HYDROXIDE-SIMETH 200-200-20 MG/5ML PO SUSP: 30.0000 mL | ORAL | Status: DC | PRN

## 2020-08-28 MED ORDER — HYDROXYZINE HCL 25 MG PO TABS: 25.0000 mg | ORAL_TABLET | Freq: Three times a day (TID) | ORAL | Status: DC | PRN

## 2020-08-28 MED ORDER — ACETAMINOPHEN 325 MG PO TABS: 650.0000 mg | ORAL_TABLET | Freq: Four times a day (QID) | ORAL | Status: DC | PRN

## 2020-08-28 NOTE — ED Provider Notes (Signed)
Report called to [employer], PA at [facility] emergency department.

## 2020-08-28 NOTE — ED Provider Notes (Signed)
Care assumed from [facility], PA-C at shift change.   Per her note, "44 year old male with a history of drug abuse, hypertension, schizophrenia, tardive dyskinesia presents to the ER with complaints of "coming down" from meth and requesting detox.  Patient states that he works and basically uses his entire paycheck to buy meth.  States he ingests it, last use was yesterday at 2 PM.  States that he feels weak, does not want to be dictated by this drug anymore.  He states that he has been in prison for the last 15 years, and has not resumed his medications for his tardive dyskinesia and schizophrenia.  He is requesting his Zoloft and Artane which he takes for the tardive dyskinesia.  He also states that he has a history of migraines, used to receive Imitrex in prison.  States he has a moderate headache, however states this feels more mild than his migraines.  He denies any vision changes, nausea, vomiting, facial droop, slurred speech.  He denies any chest pain or shortness of breath.  He denies any suicidal or homicidal ideations.  Denies any voices or hallucinations at this time."  Physical Exam  BP (!) 152/95   Pulse (!) 104   Temp 97.8 F (36.6 C) (Oral)   Resp 15   Ht 5\' 10"  (1.778 m)   Wt 90.7 kg   SpO2 100%   BMI 28.70 kg/m   Physical Exam  ED Course/Procedures   Clinical Course as of Aug 28 1653  Sat Aug 28, 2020  1202 [id number]    [MB]    Clinical Course User Index [MB] Aug 30, 2020, PA-C    Procedures  Results for orders placed or performed during the hospital encounter of 08/28/20  CBC with Differential  Result Value Ref Range   WBC 9.6 4.0 - 10.5 K/uL   RBC 4.86 4.22 - 5.81 MIL/uL   Hemoglobin 13.5 13.0 - 17.0 g/dL   HCT 08/30/20 39 - 52 %   MCV 87.9 80.0 - 100.0 fL   MCH 27.8 26.0 - 34.0 pg   MCHC 31.6 30.0 - 36.0 g/dL   RDW 81.1 57.2 - 62.0 %   Platelets 365 150 - 400 K/uL   nRBC 0.0 0.0 - 0.2 %   Neutrophils Relative % 65 %   Neutro Abs 6.2 1.7 - 7.7 K/uL    Lymphocytes Relative 26 %   Lymphs Abs 2.5 0.7 - 4.0 K/uL   Monocytes Relative 6 %   Monocytes Absolute 0.6 0 - 1 K/uL   Eosinophils Relative 2 %   Eosinophils Absolute 0.2 0 - 0 K/uL   Basophils Relative 1 %   Basophils Absolute 0.1 0 - 0 K/uL   Immature Granulocytes 0 %   Abs Immature Granulocytes 0.02 0.00 - 0.07 K/uL  Basic metabolic panel  Result Value Ref Range   Sodium 139 135 - 145 mmol/L   Potassium 3.9 3.5 - 5.1 mmol/L   Chloride 103 98 - 111 mmol/L   CO2 25 22 - 32 mmol/L   Glucose, Bld 134 (H) 70 - 99 mg/dL   BUN 12 6 - 20 mg/dL   Creatinine, Ser 35.5 0.61 - 1.24 mg/dL   Calcium 9.4 8.9 - 9.74 mg/dL   GFR calc non Af Amer >60 >60 mL/min   GFR calc Af Amer >60 >60 mL/min   Anion gap 11 5 - 15   No results found.   MDM   Briefly, patient presenting to the emergency department  today for evaluation for drug problem.  Uses meth on a regular basis.  Patient's work-up completed by prior provider was reassuring and at shift change the plan was to have him transferred over to [facility].  Prior provider was waiting for confirmation to speak with accepting provider, Arlana Pouch, NP.  I confirmed with state that patient is accepted to [facility]. She requests COVID test and advises to send pt over.     Rayne Du 08/28/20 1654    Pollyann Savoy, MD 08/28/20 [phone]

## 2020-08-28 NOTE — Care Management (Signed)
PCP assigned CHW, can have primary to reorder medications and there is a pharmacy there to assist in refilling those medications. financial assistance, can refer out to other MD as needed.

## 2020-08-28 NOTE — ED Triage Notes (Signed)
To triage via EMS from [facility].  Needs medically cleared for muscle cramps both legs and elevated HR  EMS BP 152/88 HR 110 RR 18 SpO2 99% RA  Temp 98.6 CBG 160

## 2020-08-28 NOTE — ED Notes (Signed)
Patient verbalizes understanding of discharge instructions. Opportunity for questioning and answers were provided. Armband removed by staff, pt discharged from ED ambulatory to home.  
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
## 2020-08-28 NOTE — ED Triage Notes (Addendum)
States he wants to get off Meth, last used 2pm yest. States he has been off his medications for several years because he was in prison , and needs to get back on them.

## 2020-08-28 NOTE — ED Notes (Signed)
Patient Belongings in locker #15

## 2020-08-28 NOTE — ED Notes (Addendum)
Patient with tachycardia heart rate 120 patient complaining of severe intermittent cramps in bilateral legs. Patient to be transported back to [facility] for re-evaluation per Np. Philomena Course and Ed Press photographer made aware at [employer] ED Ems at bedside to transport patient to ED. Report given to EMS.

## 2020-08-28 NOTE — ED Provider Notes (Signed)
Behavioral Health Urgent Care Medical Screening Exam  Patient Name: Cole Henderson MRN: [id number] Date of Evaluation: 08/28/20 Chief Complaint:   Diagnosis:  Final diagnoses:  Substance-induced psychotic disorder (HCC)    History of Present illness: Cole Henderson is a 44 y.o. male.  Patient presents voluntarily to [facility] behavioral health center for walk-in assessment.  Patient assessed by nurse practitioner.  Patient alert and oriented, participates actively in assessment.  Patient cooperative with assessment and hyperverbal at times.  Patient states "I am here to stop using, I just need a crisis bed to get away from it and calm down."  Patient reports he has been using methamphetamines daily x7 months.  Patient reports he has a history of substance use disorder since age 41.  Patient denies suicidal ideations.  Patient reports 2 prior suicide attempts, last attempt in 1998.  Patient denies homicidal ideations.  Patient denies auditory and visual hallucinations.  Patient endorses delusion.  Patient states "since I was 44 years old I have lived with [name], [name] is a different person, a different entity that lives in my head."  Patient reports he uses methamphetamine and other substances to "cope" with delusion.  Patient reports he resides in [place].  Patient denies access to weapons.  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  Patient offered support and encouragement.  Patient reports he has been prescribed different medications, particularly while spending 15 years in federal prison.  Patient reports he now suffers from tardive dyskinesia and states he "will not take antipsychotic medications."  Patient educated regarding benefits of gabapentin, verbalizes understanding.    Psychiatric Specialty Exam  Presentation  General Appearance:Disheveled  Eye  Contact:Fair  Speech:Clear and Coherent;Normal Rate  Speech Volume:Normal  Handedness:Right   Mood and Affect  Mood:Anxious  Affect:Congruent   Thought Process  Thought Processes:Coherent;Goal Directed  Descriptions of Associations:Intact  Orientation:Full (Time, Place and Person)  Thought Content:Logical  Hallucinations:None  Ideas of Reference:None  Suicidal Thoughts:No  Homicidal Thoughts:No   Sensorium  Memory:Immediate Fair;Recent Fair;Remote Fair  Judgment:Fair  Insight:Fair   Executive Functions  Concentration:Fair  Attention Span:Fair  Recall:Fair  Fund of Knowledge:Fair  Language:Fair   Psychomotor Activity  Psychomotor Activity:Restlessness   Assets  Assets:Communication Skills;Desire for Improvement;Physical Health;Resilience   Sleep  Sleep:Poor  Number of hours: No data recorded  Physical Exam: Physical Exam Vitals and nursing note reviewed.  Constitutional:      Appearance: He is well-developed.  HENT:     Head: Normocephalic.  Cardiovascular:     Rate and Rhythm: Normal rate.  Pulmonary:     Effort: Pulmonary effort is normal.  Neurological:     Mental Status: He is alert and oriented to person, place, and time.  Psychiatric:        Attention and Perception: Attention normal.        Mood and Affect: Mood is anxious. Affect is inappropriate.        Speech: Speech is rapid and pressured.        Behavior: Behavior is cooperative.        Thought Content: Thought content is delusional.        Judgment: Judgment is impulsive.    Review of Systems  Constitutional: Negative.   HENT: Negative.   Eyes: Negative.   Respiratory: Negative.   Cardiovascular: Negative.   Gastrointestinal: Negative.   Genitourinary: Negative.   Musculoskeletal: Negative.   Skin:  Negative.   Neurological: Negative.   Endo/Heme/Allergies: Negative.   Psychiatric/Behavioral: Positive for substance abuse. The patient is nervous/anxious.     Blood pressure (!) 164/102, pulse (!) 110, temperature 97.8 F (36.6 C), temperature source Temporal, resp. rate 18, SpO2 98 %. There is no height or weight on file to calculate BMI.  Musculoskeletal: Strength & Muscle Tone: within normal limits Gait & Station: normal Patient leans: N/A   [facility] MSE Discharge Disposition for Follow up and Recommendations: Based on my evaluation the patient appears to have an emergency medical condition for which I recommend the patient be transferred to the emergency department for further evaluation.       Patrcia Dolly, FNP 08/28/2020, 7:41 PM

## 2020-08-28 NOTE — Discharge Instructions (Addendum)
Please make sure to go to the behavioral health urgent care for further treatment.

## 2020-08-28 NOTE — Progress Notes (Signed)
CSW met with patient to discuss substance use support options. CSW noted patient presented visibily in a manner that likely indicated stimulant withdrawal. Patient reports he has a home but is worried if he goes home he will immediately relapse as he feels miserable with current withdrawal symptoms.. CSW discussed with patient options for supports and noted patient requested crisis supports to help him with the stimulant withdrawal. CSW reviewed community resources and noted patient requested he wanted to go to the [facility]gent and noted he needed his medications refilled to. CSW is not recommending discharge to the urgent care but will provide transportation assistance on patient's request.

## 2020-08-28 NOTE — ED Notes (Signed)
Transportation through [employer] arranged; Cab voucher secured with [employer] staff.

## 2020-08-28 NOTE — ED Provider Notes (Signed)
[facility] EMERGENCY DEPARTMENT Provider Note   CSN: [id number] Arrival date & time: 08/28/20  0455     History Chief Complaint  Patient presents with  . Drug Problem    Cole Henderson is a 44 y.o. male.  HPI 44 year old male with a history of drug abuse, hypertension, schizophrenia, tardive dyskinesia presents to the ER with complaints of "coming down" from meth and requesting detox.  Patient states that he works and basically uses his entire paycheck to buy meth.  States he ingests it, last use was yesterday at 2 PM.  States that he feels weak, does not want to be dictated by this drug anymore.  He states that he has been in prison for the last 15 years, and has not resumed his medications for his tardive dyskinesia and schizophrenia.  He is requesting his Zoloft and Artane which he takes for the tardive dyskinesia.  He also states that he has a history of migraines, used to receive Imitrex in prison.  States he has a moderate headache, however states this feels more mild than his migraines.  He denies any vision changes, nausea, vomiting, facial droop, slurred speech.  He denies any chest pain or shortness of breath.  He denies any suicidal or homicidal ideations.  Denies any voices or hallucinations at this time.    Past Medical History:  Diagnosis Date  . Drug abuse (HCC)   . Hypertension   . Schizophrenia (HCC)   . Tardive dyskinesia     Patient Active Problem List   Diagnosis Date Noted  . MIGRAINE, UNSPEC., W/O INTRACTABLE MIGRAINE 01/31/2007  . HYPERTENSION, BENIGN SYSTEMIC 01/31/2007  . GASTROESOPHAGEAL REFLUX, NO ESOPHAGITIS 01/31/2007    History reviewed. No pertinent surgical history.     No family history on file.  Social History   Tobacco Use  . Smoking status: Current Every Day Smoker    Packs/day: 0.50  . Smokeless tobacco: Never Used  Substance Use Topics  . Alcohol use: Yes  . Drug use: Yes    Types: Methamphetamines    Home  Medications Prior to Admission medications   Medication Sig Start Date End Date Taking? Authorizing Provider  Aspirin-Salicylamide-Caffeine (BC HEADACHE PO) Take 1 packet by mouth daily as needed (pain).    Yes [provider]  sertraline (ZOLOFT) 100 MG tablet Take 100 mg by mouth in the morning and at bedtime.   Yes [provider]  trihexyphenidyl (ARTANE) 5 MG tablet Take 1 tablet (5 mg total) by mouth 3 (three) times daily with meals. Patient taking differently: Take 5 mg by mouth 2 (two) times daily with a meal.  02/24/20  Yes Lawyer, Cristal Deer, PA-C    Allergies    Cogentin [benztropine], Geodon [ziprasidone], Haldol [haloperidol lactate], and Keflex [cephalexin]  Review of Systems   Review of Systems  Constitutional: Negative for chills and fever.  HENT: Negative for ear pain and sore throat.   Eyes: Negative for pain and visual disturbance.  Respiratory: Negative for cough and shortness of breath.   Cardiovascular: Negative for chest pain and palpitations.  Gastrointestinal: Negative for abdominal pain and vomiting.  Genitourinary: Negative for dysuria and hematuria.  Musculoskeletal: Negative for arthralgias and back pain.  Skin: Negative for color change and rash.  Neurological: Positive for weakness and headaches. Negative for seizures and syncope.  All other systems reviewed and are negative.   Physical Exam Updated Vital Signs BP (!) 152/95   Pulse (!) 104   Temp 97.8 F (  36.6 C) (Oral)   Resp 15   Ht 5\' 10"  (1.778 m)   Wt 90.7 kg   SpO2 100%   BMI 28.70 kg/m   Physical Exam Vitals and nursing note reviewed.  Constitutional:      General: He is not in acute distress.    Appearance: Normal appearance. He is well-developed. He is not ill-appearing, toxic-appearing or diaphoretic.  HENT:     Head: Normocephalic and atraumatic.     Mouth/Throat:     Mouth: Mucous membranes are moist.     Pharynx: Oropharynx is clear.  Eyes:      Conjunctiva/sclera: Conjunctivae normal.     Pupils: Pupils are equal, round, and reactive to light.  Cardiovascular:     Rate and Rhythm: Normal rate and regular rhythm.     Pulses: Normal pulses.     Heart sounds: Normal heart sounds. No murmur heard.   Pulmonary:     Effort: Pulmonary effort is normal. No respiratory distress.     Breath sounds: Normal breath sounds.  Abdominal:     General: Abdomen is flat.     Palpations: Abdomen is soft.     Tenderness: There is no abdominal tenderness.  Musculoskeletal:        General: No tenderness or deformity. Normal range of motion.     Cervical back: Neck supple.     Right lower leg: No edema.     Left lower leg: No edema.  Skin:    General: Skin is warm and dry.  Neurological:     General: No focal deficit present.     Mental Status: He is alert and oriented to person, place, and time.     Cranial Nerves: No cranial nerve deficit.     Sensory: No sensory deficit.     Motor: No weakness.     Coordination: Coordination normal.     ED Results / Procedures / Treatments   Labs (all labs ordered are listed, but only abnormal results are displayed) Labs Reviewed  BASIC METABOLIC PANEL - Abnormal; Notable for the following components:      Result Value   Glucose, Bld 134 (*)    All other components within normal limits  CBC WITH DIFFERENTIAL/PLATELET    EKG EKG Interpretation  Date/Time:  Saturday August 28 2020 09:50:54 EDT Ventricular Rate:  113 PR Interval:    QRS Duration: 93 QT Interval:  323 QTC Calculation: 443 R Axis:   80 Text Interpretation: Sinus tachycardia When compared to prior, faster rate. No STEMI Confirmed by [id number] (Theda Belfast) on 08/28/2020 11:17:25 AM   Radiology No results found.  Procedures Procedures (including critical care time)  Medications Ordered in ED Medications  lisinopril (ZESTRIL) tablet 40 mg (40 mg Oral Given 08/28/20 0943)  acetaminophen (TYLENOL) tablet 650 mg (650 mg Oral  Given 08/28/20 0946)  sodium chloride 0.9 % bolus 1,000 mL (0 mLs Intravenous Stopped 08/28/20 1355)    ED Course  I have reviewed the triage vital signs and the nursing notes.  Pertinent labs & imaging results that were available during my care of the patient were reviewed by me and considered in my medical decision making (see chart for details).  Clinical Course as of Aug 28 1525  Sat Aug 28, 2020  1202 [id number]    [MB]    Clinical Course User Index [MB] Aug 30, 2020   MDM Rules/Calculators/A&P  44 year old male with a history of substance abuse presents to the ED requesting detox from methamphetamine On presentation, he is alert, oriented, nontoxic-appearing, no acute distress, speaking full sentences without increased work of breathing, resting comfortably in the ER bed.  Vitals on arrival with a blood pressure of 163/105, tachycardic between 110 and 115.  Patient states that he does have history of hypertension and takes lisinopril 40 mg twice daily.  States he took his medications yesterday but has not had them this morning.  He denies any chest pain or shortness of breath.  He endorses a mild headache, states he has a history of migraines which required Imitrex, however states that this is much more mild than his migraines.  He denies any vision changes, nausea/vomiting.  Doubt hypertensive urgency/emergency.  Neurologic exam without abnormalities, pupils equal, gross sensations and strength intact.  No noticeable facial droop or other cranial nerve deficits.  His CBC and BMP are unremarkable.  EKG sinus tach.  Will give Tylenol for headache, dose of his lisinopril and recheck his blood pressure.  Peers support consulted.  Will provide the patient with resources to follow-up for rehab.  Patient is aware that we cannot refill his Zoloft and Artane, he will need to see psych for this.   2:36 PM: Transition of care arranged all about community health and  wellness.  I spoke with Catalina Pizza RN with the transition of care team, patient remains adamant that he cannot go home as he is afraid that he will use meth again and does not want to relapse.  She has reached out to the CSW Joe who will give the patient some resources.  Unfortunately most rehab places are closed over the weekend.  2:58PM: Social work has arranged a bed for the patient at [facility]. Awaiting call from accepting provider Berneice Heinrich. HTN improved with home meds, no signs of hypertensive urgency/emergency and tachycardia improved as well.   Signed out care to [facility] who will oversee the patient's transfer to [facility]. Hemodynamically stable.    Final Clinical Impression(s) / ED Diagnoses Final diagnoses:  Drug abuse and dependence [facility])    Rx / DC Orders ED Discharge Orders    None       Leone Brand 08/28/20 1526    Tegeler, Canary Brim, MD 08/29/20 [id number]

## 2020-08-28 NOTE — ED Notes (Addendum)
Patient BP and pulse are high. Patient O2 is 83. NP Inetta Fermo is informed.

## 2020-08-28 NOTE — ED Notes (Signed)
Pt requested "salt" for cramps, given [place] sandwich with mustard and crackers.

## 2020-08-29 ENCOUNTER — Ambulatory Visit (HOSPITAL_COMMUNITY)
Admission: EM | Admit: 2020-08-29 | Discharge: 2020-08-30 | Disposition: A | Discharge status: Home / Self Care | Payer: No Payment, Other | Attending: Family | Admitting: Family

## 2020-08-29 ENCOUNTER — Other Ambulatory Visit: Payer: Self-pay

## 2020-08-29 DIAGNOSIS — F1520 Other stimulant dependence, uncomplicated: Secondary | ICD-10-CM

## 2020-08-29 DIAGNOSIS — F17200 Nicotine dependence, unspecified, uncomplicated: Secondary | ICD-10-CM | POA: Insufficient documentation

## 2020-08-29 LAB — CK: Total CK: 511 U/L — ABNORMAL HIGH (ref 49–397)

## 2020-08-29 LAB — I-STAT CHEM 8, ED
BUN: 17 mg/dL (ref 6–20)
Calcium, Ion: 1.21 mmol/L (ref 1.15–1.40)
Chloride: 103 mmol/L (ref 98–111)
Creatinine, Ser: 0.8 mg/dL (ref 0.61–1.24)
Glucose, Bld: 124 mg/dL — ABNORMAL HIGH (ref 70–99)
HCT: 36 % — ABNORMAL LOW (ref 39.0–52.0)
Hemoglobin: 12.2 g/dL — ABNORMAL LOW (ref 13.0–17.0)
Potassium: 4 mmol/L (ref 3.5–5.1)
Sodium: 139 mmol/L (ref 135–145)
TCO2: 24 mmol/L (ref 22–32)

## 2020-08-29 MED ORDER — LISINOPRIL 20 MG PO TABS
40.0000 mg | ORAL_TABLET | Freq: Once | ORAL | Status: AC
  Administered 2020-08-29: 40 mg via ORAL
  Filled 2020-08-29: qty 2

## 2020-08-29 MED ORDER — ACETAMINOPHEN 325 MG PO TABS
650.0000 mg | ORAL_TABLET | Freq: Four times a day (QID) | ORAL | Status: DC | PRN
  Administered 2020-08-29 – 2020-08-30 (×2): 650 mg via ORAL
  Filled 2020-08-29 (×2): qty 2

## 2020-08-29 MED ORDER — BUTALBITAL-APAP-CAFFEINE 50-325-40 MG PO TABS
1.0000 | ORAL_TABLET | Freq: Once | ORAL | Status: AC
  Administered 2020-08-29: 1 via ORAL
  Filled 2020-08-29: qty 1

## 2020-08-29 MED ORDER — GABAPENTIN 100 MG PO CAPS
200.0000 mg | ORAL_CAPSULE | Freq: Three times a day (TID) | ORAL | Status: DC
  Administered 2020-08-29 (×2): 200 mg via ORAL
  Filled 2020-08-29 (×2): qty 2
  Filled 2020-08-29: qty 42

## 2020-08-29 MED ORDER — ALUM & MAG HYDROXIDE-SIMETH 200-200-20 MG/5ML PO SUSP: 30.0000 mL | ORAL | Status: DC | PRN

## 2020-08-29 MED ORDER — HYDROXYZINE HCL 25 MG PO TABS
25.0000 mg | ORAL_TABLET | Freq: Three times a day (TID) | ORAL | Status: DC | PRN
  Administered 2020-08-29: 25 mg via ORAL
  Filled 2020-08-29: qty 1

## 2020-08-29 MED ORDER — TRAZODONE HCL 50 MG PO TABS: 50.0000 mg | ORAL_TABLET | Freq: Every evening | ORAL | Status: DC | PRN

## 2020-08-29 MED ORDER — MAGNESIUM HYDROXIDE 400 MG/5ML PO SUSP: 30.0000 mL | Freq: Every day | ORAL | Status: DC | PRN
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
## 2020-08-29 NOTE — ED Notes (Signed)
Pt asleep with even and unlabored respirations. No distress or discomfort noted. Pt remains safe on the unit. Will continue to monitor. 

## 2020-08-29 NOTE — ED Provider Notes (Signed)
Discussed with Inetta Fermo, NP and [facility] who accepts transfer.   Jeannie Fend, PA-C 08/29/20 1029    Geoffery Lyons, MD 08/29/20 1343

## 2020-08-29 NOTE — ED Notes (Signed)
Patient given dinner

## 2020-08-29 NOTE — ED Notes (Signed)
Belongings in locker #9 

## 2020-08-29 NOTE — ED Provider Notes (Signed)
[facility] EMERGENCY DEPARTMENT Provider Note   CSN: [id number] Arrival date & time: 08/28/20  2057     History Chief Complaint  Patient presents with  . muscle cramps    Cole Henderson is a 44 y.o. male.  44 year old male with a history of drug abuse, hypertension, schizophrenia presents to the ED from [facility] for evaluation of muscle cramps and persistent tachycardia.  Heart rate in the upper 90s while in the exam room.  Patient denies any complaints of palpitations.  States that he had a cramp in his left calf which improved when he hit it.  Has no complaints at the present time.  Did not take his lisinopril yesterday evening.  Psychiatric team sent for reassessment and repeat medical clearance.  The history is provided by the patient. No language interpreter was used.       Past Medical History:  Diagnosis Date  . Drug abuse (HCC)   . Hypertension   . Schizophrenia (HCC)   . Tardive dyskinesia     Patient Active Problem List   Diagnosis Date Noted  . MIGRAINE, UNSPEC., W/O INTRACTABLE MIGRAINE 01/31/2007  . HYPERTENSION, BENIGN SYSTEMIC 01/31/2007  . GASTROESOPHAGEAL REFLUX, NO ESOPHAGITIS 01/31/2007    History reviewed. No pertinent surgical history.     History reviewed. No pertinent family history.  Social History   Tobacco Use  . Smoking status: Current Every Day Smoker    Packs/day: 0.50  . Smokeless tobacco: Never Used  Substance Use Topics  . Alcohol use: Yes  . Drug use: Yes    Types: Methamphetamines    Home Medications Prior to Admission medications   Medication Sig Start Date End Date Taking? Authorizing Provider  Aspirin-Salicylamide-Caffeine (BC HEADACHE PO) Take 1 packet by mouth daily as needed (pain).     [provider]  sertraline (ZOLOFT) 100 MG tablet Take 100 mg by mouth in the morning and at bedtime.    [provider]  trihexyphenidyl (ARTANE) 5 MG tablet Take 1 tablet (5 mg total) by mouth 3  (three) times daily with meals. Patient taking differently: Take 5 mg by mouth 2 (two) times daily with a meal.  02/24/20   Lawyer, Cristal Deer, PA-C    Allergies    Cogentin [benztropine], Geodon [ziprasidone], Haldol [haloperidol lactate], and Keflex [cephalexin]  Review of Systems   Review of Systems  Ten systems reviewed and are negative for acute change, except as noted in the HPI.    Physical Exam Updated Vital Signs BP (!) 145/88 (BP Location: Left Arm)   Pulse (!) 101   Resp 18   SpO2 100%   Physical Exam Vitals and nursing note reviewed.  Constitutional:      General: He is not in acute distress.    Appearance: He is well-developed. He is not diaphoretic.     Comments: Nontoxic appearing and in NAD  HENT:     Head: Normocephalic and atraumatic.  Eyes:     General: No scleral icterus.    Conjunctiva/sclera: Conjunctivae normal.  Cardiovascular:     Rate and Rhythm: Normal rate and regular rhythm.     Pulses: Normal pulses.     Comments: HR 96-101bpm Pulmonary:     Effort: Pulmonary effort is normal. No respiratory distress.     Comments: Respirations even and unlabored Musculoskeletal:        General: Normal range of motion.     Cervical back: Normal range of motion.  Skin:    General: Skin  is warm and dry.     Coloration: Skin is not pale.     Findings: No erythema or rash.  Neurological:     Mental Status: He is alert and oriented to person, place, and time.     Coordination: Coordination normal.  Psychiatric:        Behavior: Behavior normal.     ED Results / Procedures / Treatments   Labs (all labs ordered are listed, but only abnormal results are displayed) Labs Reviewed  CK - Abnormal; Notable for the following components:      Result Value   Total CK 511 (*)    All other components within normal limits  I-STAT CHEM 8, ED - Abnormal; Notable for the following components:   Glucose, Bld 124 (*)    Hemoglobin 12.2 (*)    HCT 36.0 (*)    All  other components within normal limits    EKG None  Radiology No results found.  Procedures Procedures (including critical care time)  Medications Ordered in ED Medications  butalbital-acetaminophen-caffeine (FIORICET) 50-325-40 MG per tablet 1 tablet (1 tablet Oral Given 08/29/20 0557)  lisinopril (ZESTRIL) tablet 40 mg (40 mg Oral Given 08/29/20 0557)    ED Course  I have reviewed the triage vital signs and the nursing notes.  Pertinent labs & imaging results that were available during my care of the patient were reviewed by me and considered in my medical decision making (see chart for details).    MDM Rules/Calculators/A&P                          Uncomplicated muscle cramps/spasms, likely perpetuated by substance use and/or dehydration. Heart rate in the 90's. Given his AM dose of Lisinopril. Patient medically cleared for transfer back to [facility].   Final Clinical Impression(s) / ED Diagnoses Final diagnoses:  Muscle cramps    Rx / DC Orders ED Discharge Orders    None       Antony Madura, PA-C 08/29/20 6295    Sabas Sous, MD 08/29/20 [phone]

## 2020-08-29 NOTE — ED Notes (Signed)
Pt was in room going threw cabinets and attempting to take objects off trauma carts, explained to patient that he was not able to do so and he became hostile with this RN, security at bedside, pt then apologized to staff. Pt moved from room and into hallway where he could not rummage threw things.

## 2020-08-29 NOTE — ED Notes (Signed)
Locker #9 

## 2020-08-29 NOTE — ED Provider Notes (Signed)
Behavioral Health Admission H&P [facility](FBC & OBS)  Date: 08/29/20 Patient Name: Cole Henderson MRN: [id number] Chief Complaint: No chief complaint on file.     Diagnoses:  Final diagnoses:  Methamphetamine use disorder, severe (HCC)    HPI: [facility] department for safe transport.  Patient assessed by nurse practitioner. Patient alert and oriented, answers appropriately. Patient continues to report feeling effects of methamphetamine use approximately 48 hours ago.  Patient reports he would like to have a place to stay until he returns to work on Tuesday or Wednesday. Patient resides in [place] a roommate. Patient denies access to weapons. Patient reports he is on probation after being released from prison. 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 Patient endorses methamphetamine use daily x7 months. Patient denies alcohol use, patient denies substance use aside from methamphetamine.  Patient denies suicidal ideations currently. Patient endorses history of suicide attempts, last attempt in 1998. Patient denies homicidal ideations. Patient endorses delusion that there is an entity living within him content. Patient reports that Felicity Pellegrinied "lives in my head." Patient reports use of substance as a coping mechanism. Patient denies auditory visual hallucinations currently. Patient does not appear to be responding to internal stimuli.  Patient provided support and encouragement.  PHQ 2-9:    [facility]  Thoughts that you would be better off dead, or of hurting yourself in some way Not at all  PHQ-9 Total Score 9        Total Time spent with patient: 20 minutes  Musculoskeletal  Strength & Muscle Tone: within normal limits Gait & Station: normal Patient leans: N/A  Psychiatric Specialty Exam  Presentation General Appearance:  Appropriate for Environment  Eye Contact:Good  Speech:Clear and Coherent  Speech Volume:Increased  Handedness:Right   Mood and Affect  Mood:Anxious;Irritable  Affect:Labile   Thought Process  Thought Processes:Coherent;Goal Directed  Descriptions of Associations:Intact  Orientation:Full (Time, Place and Person)  Thought Content:Logical  Hallucinations:Hallucinations: None  Ideas of Reference:Delusions  Suicidal Thoughts:Suicidal Thoughts: No  Homicidal Thoughts:Homicidal Thoughts: No   Sensorium  Memory:Immediate Good;Recent Good;Remote Good  Judgment:Fair  Insight:Fair   Executive Functions  Concentration:Fair  Attention Span:Fair  Recall:Fair  Fund of Knowledge:Fair  Language:Fair   Psychomotor Activity  Psychomotor Activity:Psychomotor Activity: Restlessness   Assets  Assets:Communication Skills;Desire for Improvement;Financial Resources/Insurance;Housing;Intimacy;Leisure Time;Physical Health;Resilience   Sleep  Sleep:Sleep: Poor   Physical Exam Vitals and nursing note reviewed.  Constitutional:      Appearance: He is well-developed.  HENT:     Head: Normocephalic.  Cardiovascular:     Rate and Rhythm: Normal rate.  Pulmonary:     Effort: Pulmonary effort is normal.  Neurological:     Mental Status: He is alert and oriented to person, place, and time.  Psychiatric:        Attention and Perception: Attention and perception normal.        Mood and Affect: Mood and affect normal.        Speech: Speech normal.        Behavior: Behavior normal. Behavior is cooperative.        Thought Content: Thought content is delusional.        Cognition and Memory: Cognition normal.        Judgment: Judgment normal.    Review of Systems  Constitutional: Negative.   HENT: Negative.   Eyes: Negative.   Respiratory: Negative.  Cardiovascular: Negative.   Gastrointestinal: Negative.   Genitourinary: Negative.   Musculoskeletal: Negative.    Skin: Negative.   Neurological: Negative.   Endo/Heme/Allergies: Negative.   Psychiatric/Behavioral: Positive for substance abuse. The patient is nervous/anxious and has insomnia.     There were no vitals taken for this visit. There is no height or weight on file to calculate BMI.  Past Psychiatric History: Polysubstance use disorder  Is the patient at risk to self? No  Has the patient been a risk to self in the past 6 months? No .    Has the patient been a risk to self within the distant past? Yes   Is the patient a risk to others? No   Has the patient been a risk to others in the past 6 months? No   Has the patient been a risk to others within the distant past? No   Past Medical History:  Past Medical History:  Diagnosis Date  . Drug abuse (HCC)   . Hypertension   . Schizophrenia (HCC)   . Tardive dyskinesia    No past surgical history on file.  Family History: No family history on file.  Social History:  Social History   Socioeconomic History  . Marital status: Single    Spouse name: Not on file  . Number of children: Not on file  . Years of education: Not on file  . Highest education level: Not on file  Occupational History  . Not on file  Tobacco Use  . Smoking status: Current Every Day Smoker    Packs/day: 0.50  . Smokeless tobacco: Never Used  Substance and Sexual Activity  . Alcohol use: Yes  . Drug use: Yes    Types: Methamphetamines  . Sexual activity: Not on file  Other Topics Concern  . Not on file  Social History Narrative  . Not on file   Social Determinants of Health   Financial Resource Strain:   . Difficulty of Paying Living Expenses: Not on file  Food Insecurity:   . Worried About Programme researcher, broadcasting/film/video in the Last Year: Not on file  . Ran Out of Food in the Last Year: Not on file  Transportation Needs:   . Lack of Transportation (Medical): Not on file  . Lack of Transportation (Non-Medical): Not on file  Physical Activity:   . Days of  Exercise per Week: Not on file  . Minutes of Exercise per Session: Not on file  Stress:   . Feeling of Stress : Not on file  Social Connections:   . Frequency of Communication with Friends and Family: Not on file  . Frequency of Social Gatherings with Friends and Family: Not on file  . Attends Religious Services: Not on file  . Active Member of Clubs or Organizations: Not on file  . Attends Banker Meetings: Not on file  . Marital Status: Not on file  Intimate Partner Violence:   . Fear of Current or Ex-Partner: Not on file  . Emotionally Abused: Not on file  . Physically Abused: Not on file  . Sexually Abused: Not on file    SDOH:  SDOH Screenings   Alcohol Screen:   . Last Alcohol Screening Score (AUDIT): Not on file  Depression (PHQ2-9): Medium Risk  . PHQ-2 Score: 9  Financial Resource Strain:   . Difficulty of Paying Living Expenses: Not on file  Food Insecurity:   . Worried About Programme researcher, broadcasting/film/video in the Last Year:  Not on file  . Ran Out of Food in the Last Year: Not on file  Housing:   . Last Housing Risk Score: Not on file  Physical Activity:   . Days of Exercise per Week: Not on file  . Minutes of Exercise per Session: Not on file  Social Connections:   . Frequency of Communication with Friends and Family: Not on file  . Frequency of Social Gatherings with Friends and Family: Not on file  . Attends Religious Services: Not on file  . Active Member of Clubs or Organizations: Not on file  . Attends Banker Meetings: Not on file  . Marital Status: Not on file  Stress:   . Feeling of Stress : Not on file  Tobacco Use: High Risk  . Smoking Tobacco Use: Current Every Day Smoker  . Smokeless Tobacco Use: Never Used  Transportation Needs:   . Freight forwarder (Medical): Not on file  . Lack of Transportation (Non-Medical): Not on file    Last Labs:  Admission on 08/28/2020, Discharged on 08/29/2020  Component Date Value Ref Range  Status  . Sodium 08/29/2020 139  135 - 145 mmol/L Final  . Potassium 08/29/2020 4.0  3.5 - 5.1 mmol/L Final  . Chloride 08/29/2020 103  98 - 111 mmol/L Final  . BUN 08/29/2020 17  6 - 20 mg/dL Final  . Creatinine, Ser 08/29/2020 0.80  0.61 - 1.24 mg/dL Final  . Glucose, Bld 47/08/6282 124* 70 - 99 mg/dL Final   Glucose reference range applies only to samples taken after fasting for at least 8 hours.  . Calcium, Ion 08/29/2020 1.21  1.15 - 1.40 mmol/L Final  . TCO2 08/29/2020 24  22 - 32 mmol/L Final  . Hemoglobin 08/29/2020 12.2* 13.0 - 17.0 g/dL Final  . HCT 66/29/4765 36.0* 39 - 52 % Final  . Total CK 08/29/2020 511* 49.0 - 397.0 U/L Final   Performed at [facility], [address] [place], [place] [address]  Admission on 08/28/2020, Discharged on 08/28/2020  Component Date Value Ref Range Status  . WBC 08/28/2020 9.6  4.0 - 10.5 K/uL Final  . RBC 08/28/2020 4.86  4.22 - 5.81 MIL/uL Final  . Hemoglobin 08/28/2020 13.5  13.0 - 17.0 g/dL Final  . HCT 54/65/6812 42.7  39 - 52 % Final  . MCV 08/28/2020 87.9  80.0 - 100.0 fL Final  . MCH 08/28/2020 27.8  26.0 - 34.0 pg Final  . MCHC 08/28/2020 31.6  30.0 - 36.0 g/dL Final  . RDW 75/17/0017 15.0  11.5 - 15.5 % Final  . Platelets 08/28/2020 365  150 - 400 K/uL Final  . nRBC 08/28/2020 0.0  0.0 - 0.2 % Final  . Neutrophils Relative % 08/28/2020 65  % Final  . Neutro Abs 08/28/2020 6.2  1.7 - 7.7 K/uL Final  . Lymphocytes Relative 08/28/2020 26  % Final  . Lymphs Abs 08/28/2020 2.5  0.7 - 4.0 K/uL Final  . Monocytes Relative 08/28/2020 6  % Final  . Monocytes Absolute 08/28/2020 0.6  0 - 1 K/uL Final  . Eosinophils Relative 08/28/2020 2  % Final  . Eosinophils Absolute 08/28/2020 0.2  0 - 0 K/uL Final  . Basophils Relative 08/28/2020 1  % Final  . Basophils Absolute 08/28/2020 0.1  0 - 0 K/uL Final  . Immature Granulocytes 08/28/2020 0  % Final  . Abs Immature Granulocytes 08/28/2020 0.02  0.00 - 0.07 K/uL Final   Performed  at  [facility], [address] [place], [place] [address]  . Sodium 08/28/2020 139  135 - 145 mmol/L Final  . Potassium 08/28/2020 3.9  3.5 - 5.1 mmol/L Final  . Chloride 08/28/2020 103  98 - 111 mmol/L Final  . CO2 08/28/2020 25  22 - 32 mmol/L Final  . Glucose, Bld 08/28/2020 134* 70 - 99 mg/dL Final   Glucose reference range applies only to samples taken after fasting for at least 8 hours.  . BUN 08/28/2020 12  6 - 20 mg/dL Final  . Creatinine, Ser 08/28/2020 0.86  0.61 - 1.24 mg/dL Final  . Calcium 60/45/4098 9.4  8.9 - 10.3 mg/dL Final  . GFR calc non Af Amer 08/28/2020 >60  >60 mL/min Final  . GFR calc Af Amer 08/28/2020 >60  >60 mL/min Final  . Anion gap 08/28/2020 11  5 - 15 Final   Performed at [facility], [address], [place], [place] [address]  . SARS Coronavirus 2 by RT PCR 08/28/2020 NEGATIVE  NEGATIVE Final   Comment: (NOTE) SARS-CoV-2 target nucleic acids are NOT DETECTED.  The SARS-CoV-2 RNA is generally detectable in upper respiratoy specimens during the acute phase of infection. The lowest concentration of SARS-CoV-2 viral copies this assay can detect is 131 copies/mL. A negative result does not preclude SARS-Cov-2 infection and should not be used as the sole basis for treatment or other patient management decisions. A negative result may occur with  improper specimen collection/handling, submission of specimen other than nasopharyngeal swab, presence of viral mutation(s) within the areas targeted by this assay, and inadequate number of viral copies (<131 copies/mL). A negative result must be combined with clinical observations, patient history, and epidemiological information. The expected result is Negative.  Fact Sheet for Patients:  https://www.moore.com/  Fact Sheet for Healthcare Providers:  https://www.young.biz/  This test is no                          t yet approved or cleared by the [place]  FDA and  has been authorized for detection and/or diagnosis of SARS-CoV-2 by FDA under an Emergency Use Authorization (EUA). This EUA will remain  in effect (meaning this test can be used) for the duration of the COVID-19 declaration under Section 564(b)(1) of the Act, 21 U.S.C. section 360bbb-3(b)(1), unless the authorization is terminated or revoked sooner.    . Influenza A by PCR 08/28/2020 NEGATIVE  NEGATIVE Final  . Influenza B by PCR 08/28/2020 NEGATIVE  NEGATIVE Final   Comment: (NOTE) The Xpert Xpress SARS-CoV-2/FLU/RSV assay is intended as an aid in  the diagnosis of influenza from Nasopharyngeal swab specimens and  should not be used as a sole basis for treatment. Nasal washings and  aspirates are unacceptable for Xpert Xpress SARS-CoV-2/FLU/RSV  testing.  Fact Sheet for Patients: https://www.moore.com/  Fact Sheet for Healthcare Providers: https://www.young.biz/  This test is not yet approved or cleared by the [place] FDA and  has been authorized for detection and/or diagnosis of SARS-CoV-2 by  FDA under an Emergency Use Authorization (EUA). This EUA will remain  in effect (meaning this test can be used) for the duration of the  Covid-19 declaration under Section 564(b)(1) of the Act, 21  U.S.C. section 360bbb-3(b)(1), unless the authorization is  terminated or revoked. Performed at [facility], [address] [place], [place] [address]     Allergies: Cogentin [benztropine], Geodon [ziprasidone], Haldol [  haloperidol lactate], and Keflex [cephalexin]  PTA Medications: (Not in a hospital admission)   Medical Decision Making  Patient was medically cleared in emergency department.   Medications:  -Hydroxyzine 25 mg Q6 hours PRN/anxiety -Trazodone 50 mg QHS PRN/ insomnia -Gabapentin 200 mg TID to target withdrawal symptoms and cravings    Recommendations  Based on my evaluation the patient does not appear to  have an emergency medical condition.   Patient reviewed with Dr. Nelly Rout.   Patient placed in continuous assessment area at [facility] for treatment and stabilization. Patient will be re-evaluated on 08/30/2020 to determine disposition.   Patrcia Dolly, FNP 08/29/20  12:26 PM

## 2020-08-29 NOTE — ED Notes (Signed)
Safe transport called 

## 2020-08-30 MED ORDER — TRIHEXYPHENIDYL HCL 5 MG PO TABS
5.0000 mg | ORAL_TABLET | Freq: Two times a day (BID) | ORAL | Status: DC
  Filled 2020-08-30: qty 14

## 2020-08-30 MED ORDER — TRIHEXYPHENIDYL HCL 5 MG PO TABS: 5.0000 mg | ORAL_TABLET | Freq: Two times a day (BID) | ORAL | 0 refills | Status: DC

## 2020-08-30 MED ORDER — SERTRALINE HCL 50 MG PO TABS: 50.0000 mg | ORAL_TABLET | Freq: Every day | ORAL | 0 refills | Status: DC

## 2020-08-30 MED ORDER — GABAPENTIN 100 MG PO CAPS: 200.0000 mg | ORAL_CAPSULE | Freq: Three times a day (TID) | ORAL | 0 refills | Status: DC

## 2020-08-30 MED ORDER — SERTRALINE HCL 50 MG PO TABS
50.0000 mg | ORAL_TABLET | Freq: Every day | ORAL | Status: DC
  Filled 2020-08-30: qty 7

## 2020-08-30 NOTE — ED Notes (Signed)
Patient requested breakfast; given muffin and cereal.

## 2020-08-30 NOTE — ED Notes (Signed)
Patient requested breakfast; given cereal

## 2020-08-30 NOTE — ED Provider Notes (Signed)
FBC/OBS ASAP Discharge Summary  Date and Time: 08/30/2020 9:48 AM  Name: Cole Henderson  MRN:  [id number]   Discharge Diagnoses:  Final diagnoses:  Methamphetamine use disorder, severe (HCC)    Subjective: Patient reports today that he is feeling better. 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 He denies any suicidal or homicidal ideations and denies any hallucinations. He reports that he is tired of getting cold food and the food here is garbage. He states that he has a place to stay and has a roommate. He is requesting medications and prescriptions for his medications. He also reports that he will follow up with therapy and provider at [facility] outpatient.   Stay Summary: Patient is a 44 year-old male that presented to the [facility] reporting methamphetamine abuse and suicidal ideations. He was transported to [facility] for medical clearance and was transferred back to [facility]. Patient has slept well and was requesting to be restarted on medications. Patient was started on Gabapentin 200 mg TID and requested Zoloft and Artane to be restarted. He reported 15 years in a federal prison and had been prescribed antipsychotics. Patient was prescribed requested medications and given 7 day samples of his medications and 30 day prescriptions. Patinet was instructed to follow up at [facility] during open access hours and provided with the information. Patient continued to deny any suicidal or homicidal ideations and deny any hallucinations. Patient will be discharged home and plans to return to work.  Total Time spent with patient: 30 minutes  Past Psychiatric History: Schizophrenia, MDD, substance abuse Past Medical History:  Past Medical History:  Diagnosis Date  . Drug abuse (HCC)   . Hypertension   . Schizophrenia (HCC)   . Tardive dyskinesia    No past surgical history on file. Family History: No family history on file. Family Psychiatric History: None reported Social History:  Social History    Substance and Sexual Activity  Alcohol Use Yes     Social History   Substance and Sexual Activity  Drug Use Yes  . Types: Methamphetamines    Social History   Socioeconomic History  . Marital status: Single    Spouse name: Not on file  . Number of children: Not on file  . Years of education: Not on file  . Highest education level: Not on file  Occupational History  . Not on file  Tobacco Use  . Smoking status: Current Every Day Smoker    Packs/day: 0.50  . Smokeless tobacco: Never Used  Substance and Sexual Activity  . Alcohol use: Yes  . Drug use: Yes    Types: Methamphetamines  . Sexual activity: Not on file  Other Topics Concern  . Not on file  Social History Narrative  . Not on file   Social Determinants of Health   Financial Resource Strain:   . Difficulty of Paying Living Expenses: Not on file  Food Insecurity:   . Worried About Programme researcher, broadcasting/film/video in the Last Year: Not on file  . Ran Out of Food in the Last Year: Not on file  Transportation Needs:   . Lack of Transportation (Medical): Not on file  . Lack of Transportation (Non-Medical): Not on file  Physical Activity:   . Days of Exercise per Week: Not on file  . Minutes of Exercise per Session: Not on file  Stress:   . Feeling of Stress : Not on file  Social Connections:   . Frequency of Communication  with Friends and Family: Not on file  . Frequency of Social Gatherings with Friends and Family: Not on file  . Attends Religious Services: Not on file  . Active Member of Clubs or Organizations: Not on file  . Attends Banker Meetings: Not on file  . Marital Status: Not on file   SDOH:  SDOH Screenings   Alcohol Screen:   . Last Alcohol Screening Score (AUDIT): Not on file  Depression (PHQ2-9): Medium Risk  . PHQ-2 Score: 9  Financial Resource Strain:   . Difficulty of Paying Living Expenses: Not on file  Food Insecurity:   . Worried About Programme researcher, broadcasting/film/video in the Last Year:  Not on file  . Ran Out of Food in the Last Year: Not on file  Housing:   . Last Housing Risk Score: Not on file  Physical Activity:   . Days of Exercise per Week: Not on file  . Minutes of Exercise per Session: Not on file  Social Connections:   . Frequency of Communication with Friends and Family: Not on file  . Frequency of Social Gatherings with Friends and Family: Not on file  . Attends Religious Services: Not on file  . Active Member of Clubs or Organizations: Not on file  . Attends Banker Meetings: Not on file  . Marital Status: Not on file  Stress:   . Feeling of Stress : Not on file  Tobacco Use: High Risk  . Smoking Tobacco Use: Current Every Day Smoker  . Smokeless Tobacco Use: Never Used  Transportation Needs:   . Freight forwarder (Medical): Not on file  . Lack of Transportation (Non-Medical): Not on file    Has this patient used any form of tobacco in the last 30 days? (Cigarettes, Smokeless Tobacco, Cigars, and/or Pipes) A prescription for an FDA-approved tobacco cessation medication was offered at discharge and the patient refused  Current Medications:  Current Facility-Administered Medications  Medication Dose Route Frequency Provider Last Rate Last Admin  . acetaminophen (TYLENOL) tablet 650 mg  650 mg Oral Q6H PRN Patrcia Dolly, FNP   650 mg at 08/30/20 0701  . alum & mag hydroxide-simeth (MAALOX/MYLANTA) 200-200-20 MG/5ML suspension 30 mL  30 mL Oral Q4H PRN Patrcia Dolly, FNP      . gabapentin (NEURONTIN) capsule 200 mg  200 mg Oral TID Patrcia Dolly, FNP   200 mg at 08/29/20 2146  . hydrOXYzine (ATARAX/VISTARIL) tablet 25 mg  25 mg Oral TID PRN Patrcia Dolly, FNP   25 mg at 08/29/20 1247  . magnesium hydroxide (MILK OF MAGNESIA) suspension 30 mL  30 mL Oral Daily PRN Patrcia Dolly, FNP      . sertraline (ZOLOFT) tablet 50 mg  50 mg Oral Daily Jaleel Allen, Gerlene Burdock, FNP      . traZODone (DESYREL) tablet 50 mg  50 mg Oral QHS PRN Patrcia Dolly, FNP      .  trihexyphenidyl (ARTANE) tablet 5 mg  5 mg Oral BID WC Zorana Brockwell, Gerlene Burdock, FNP       Current Outpatient Medications  Medication Sig Dispense Refill  . gabapentin (NEURONTIN) 100 MG capsule Take 2 capsules (200 mg total) by mouth 3 (three) times daily. 180 capsule 0  . sertraline (ZOLOFT) 50 MG tablet Take 1 tablet (50 mg total) by mouth daily. 30 tablet 0  . trihexyphenidyl (ARTANE) 5 MG tablet Take 1 tablet (5 mg total) by mouth 2 (two) times daily  with a meal. 60 tablet 0    PTA Medications: (Not in a hospital admission)   Musculoskeletal  Strength & Muscle Tone: within normal limits Gait & Station: normal Patient leans: N/A  Psychiatric Specialty Exam  Presentation  General Appearance: Appropriate for Environment;Disheveled  Eye Contact:Fair  Speech:Clear and Coherent;Normal Rate  Speech Volume:Normal  Handedness:Right   Mood and Affect  Mood:Euthymic;Irritable (Irritable about not getting hot food)  Affect:Appropriate;Congruent   Thought Process  Thought Processes:Coherent  Descriptions of Associations:Intact  Orientation:Full (Time, Place and Person)  Thought Content:WDL  Hallucinations:Hallucinations: None  Ideas of Reference:None  Suicidal Thoughts:Suicidal Thoughts: No  Homicidal Thoughts:Homicidal Thoughts: No   Sensorium  Memory:Immediate Good;Recent Good;Remote Good  Judgment:Fair  Insight:Fair   Executive Functions  Concentration:Good  Attention Span:Good  Recall:Good  Fund of Knowledge:Good  Language:Good   Psychomotor Activity  Psychomotor Activity:Psychomotor Activity: Normal   Assets  Assets:Communication Skills;Desire for Improvement;Housing;Social Support;Physical Health   Sleep  Sleep:Sleep: Good   Physical Exam  Physical Exam Vitals and nursing note reviewed.  Constitutional:      Appearance: He is well-developed.  HENT:     Head: Normocephalic.  Cardiovascular:     Rate and Rhythm: Normal rate.   Pulmonary:     Effort: Pulmonary effort is normal.  Musculoskeletal:        General: Normal range of motion.  Neurological:     Mental Status: He is alert and oriented to person, place, and time.    Review of Systems  Constitutional: Negative.   HENT: Negative.   Eyes: Negative.   Respiratory: Negative.   Cardiovascular: Negative.   Gastrointestinal: Negative.   Genitourinary: Negative.   Musculoskeletal: Negative.   Skin: Negative.   Neurological: Negative.   Endo/Heme/Allergies: Negative.   Psychiatric/Behavioral: Positive for substance abuse.   Blood pressure 113/80, pulse 95, temperature 97.7 F (36.5 C), temperature source Temporal, resp. rate 18, height 5\' 10"  (1.778 m), weight 88.5 kg, SpO2 99 %. Body mass index is 27.98 kg/m.  Demographic Factors:  Male, Caucasian and Low socioeconomic status  Loss Factors: NA  Historical Factors: NA  Risk Reduction Factors:   Employed, Living with another person, especially a relative and Positive social support  Continued Clinical Symptoms:  Alcohol/Substance Abuse/Dependencies Previous Psychiatric Diagnoses and Treatments  Cognitive Features That Contribute To Risk:  None    Suicide Risk:  Minimal: No identifiable suicidal ideation.  Patients presenting with no risk factors but with morbid ruminations; may be classified as minimal risk based on the severity of the depressive symptoms  Plan Of Care/Follow-up recommendations:  Continue activity as tolerated. Continue diet as recommended by your PCP. Ensure to keep all appointments with outpatient providers.  Disposition: Discharge home with medications and follow up resources  , FNP 08/30/2020, 9:48 AM

## 2020-08-30 NOTE — ED Notes (Signed)
Educated pt on avs. Verbalized understanding. Called safe transportation. [employer] sent

## 2020-08-30 NOTE — Discharge Instructions (Addendum)
Take medications as prescribed Use resources to schedule follow up appointment

## 2020-08-30 NOTE — ED Notes (Signed)
Pt resting with eyes closed. Rise and fall of chest noted. No new issues noted. Will continue to monitor

## 2020-08-30 NOTE — ED Notes (Signed)
uber arrived. Escorted pt to retrieve belongings. Escorted pt to front lobby and out front door. Ambulated per self. No new issues noted. Stable at time of d/c

## 2020-11-30 ENCOUNTER — Other Ambulatory Visit: Payer: Self-pay

## 2020-11-30 ENCOUNTER — Emergency Department (HOSPITAL_COMMUNITY)
Admission: EM | Admit: 2020-11-30 | Discharge: 2020-12-01 | Disposition: A | Discharge status: Home / Self Care | Payer: Self-pay | Attending: Emergency Medicine | Admitting: Emergency Medicine

## 2020-11-30 ENCOUNTER — Encounter (HOSPITAL_COMMUNITY): Payer: Self-pay | Admitting: Emergency Medicine

## 2020-11-30 DIAGNOSIS — I10 Essential (primary) hypertension: Secondary | ICD-10-CM | POA: Insufficient documentation

## 2020-11-30 DIAGNOSIS — R440 Auditory hallucinations: Secondary | ICD-10-CM

## 2020-11-30 DIAGNOSIS — Z8659 Personal history of other mental and behavioral disorders: Secondary | ICD-10-CM

## 2020-11-30 DIAGNOSIS — F17200 Nicotine dependence, unspecified, uncomplicated: Secondary | ICD-10-CM | POA: Insufficient documentation

## 2020-11-30 LAB — CBC
HCT: 35.3 % — ABNORMAL LOW (ref 39.0–52.0)
Hemoglobin: 11.5 g/dL — ABNORMAL LOW (ref 13.0–17.0)
MCH: 28 pg (ref 26.0–34.0)
MCHC: 32.6 g/dL (ref 30.0–36.0)
MCV: 85.9 fL (ref 80.0–100.0)
Platelets: 310 10*3/uL (ref 150–400)
RBC: 4.11 MIL/uL — ABNORMAL LOW (ref 4.22–5.81)
RDW: 14.9 % (ref 11.5–15.5)
WBC: 8.7 10*3/uL (ref 4.0–10.5)
nRBC: 0 % (ref 0.0–0.2)

## 2020-11-30 NOTE — ED Triage Notes (Signed)
Pt states he's been out of his psych meds x 5 days, hasn't slept, and has been hallucinating. Pt denies SI, but states "people get on my nerves". Pt cooperative at this time.

## 2020-12-01 DIAGNOSIS — F209 Schizophrenia, unspecified: Secondary | ICD-10-CM | POA: Insufficient documentation

## 2020-12-01 LAB — COMPREHENSIVE METABOLIC PANEL
ALT: 27 U/L (ref 0–44)
AST: 25 U/L (ref 15–41)
Albumin: 3.9 g/dL (ref 3.5–5.0)
Alkaline Phosphatase: 49 U/L (ref 38–126)
Anion gap: 10 (ref 5–15)
BUN: 27 mg/dL — ABNORMAL HIGH (ref 6–20)
CO2: 24 mmol/L (ref 22–32)
Calcium: 9.1 mg/dL (ref 8.9–10.3)
Chloride: 101 mmol/L (ref 98–111)
Creatinine, Ser: 1.23 mg/dL (ref 0.61–1.24)
GFR, Estimated: >60 mL/min (ref >60)
Glucose, Bld: 118 mg/dL — ABNORMAL HIGH (ref 70–99)
Potassium: 4.2 mmol/L (ref 3.5–5.1)
Sodium: 135 mmol/L (ref 135–145)
Total Bilirubin: 0.7 mg/dL (ref 0.3–1.2)
Total Protein: 6.4 g/dL — ABNORMAL LOW (ref 6.5–8.1)

## 2020-12-01 LAB — RAPID URINE DRUG SCREEN, HOSP PERFORMED
Amphetamines: POSITIVE — AB
Barbiturates: NOT DETECTED
Benzodiazepines: NOT DETECTED
Cocaine: NOT DETECTED
Opiates: NOT DETECTED
Tetrahydrocannabinol: NOT DETECTED

## 2020-12-01 LAB — ACETAMINOPHEN LEVEL: Acetaminophen (Tylenol), Serum: <10 ug/mL — ABNORMAL LOW (ref 10–30)

## 2020-12-01 LAB — SALICYLATE LEVEL: Salicylate Lvl: <7 mg/dL — ABNORMAL LOW (ref 7.0–30.0)

## 2020-12-01 LAB — ETHANOL: Alcohol, Ethyl (B): <10 mg/dL (ref <10)

## 2020-12-01 MED ORDER — NICOTINE 21 MG/24HR TD PT24
21.0000 mg | MEDICATED_PATCH | Freq: Once | TRANSDERMAL | Status: DC
  Administered 2020-12-01: 04:00:00 21 mg via TRANSDERMAL
  Filled 2020-12-01: qty 1

## 2020-12-01 MED ORDER — ACETAMINOPHEN 500 MG PO TABS
1000.0000 mg | ORAL_TABLET | Freq: Once | ORAL | Status: AC
  Administered 2020-12-01: 04:00:00 1000 mg via ORAL
  Filled 2020-12-01: qty 2

## 2020-12-01 NOTE — ED Notes (Signed)
Pt asleep at this time, will get vitals when he wakes up.

## 2020-12-01 NOTE — ED Notes (Signed)
Pt to TTS consult

## 2020-12-01 NOTE — BH Assessment (Signed)
Tele Assessment Note   Patient Name: Cole Henderson Henderson MRN: [id number] Referring Physician: EDP Location of Patient: [facility] Location of Provider: Behavioral Health TTS Department  Dragon Thrush is a 44 y.o. male who presented to [facility] on a voluntary basis with complaint of hallucination, insomnia, and other symptoms.  Pt lives in [place] with several friends, and he is currently unemployed.  Pt stated that he just began receiving outpatient therapy services through [facility].  Pt was last assessed by TTS in September 2021.  At that time, he presented with symptoms consistent with Schizophrenia and methamphetamine use.  Pt reported that he ran out of the medication prescribed for treatment of Schizophrenia about five days ago, and as a result, he is experiencing insomnia, poor appetite, agitation, and visual and auditory hallucination.  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  The visual hallucinations are ''kids.''  Pt has a history of meth use, and his UDS was positive for amphetamines.  However, when asked about current substance use, Pt denied current use of alcohol or street drugs.  Pt stated that he just began outpatient therapy through [facility], and he has a scheduled medication management appointment with them, but he is not sure when the appointment is.    Pt stated that he experienced visual and auditory hallucinations since age 81.  He lives in [place] with friends, and he has a history of substance is currently on supervised release, and his parole officer is Actor.    During assessment, Pt presented as alert and oriented.  He had good eye contact and was cooperative.  He appeared disheveled and [place].  Pt's motor behavior was agitated.  Pt's mood and affect were anxious.  Speech was pressured.  Thought processes were  within normal range, and thought content was tangential.  Memory and concentration were fair.  Insight, judgment, and impulse control were fair.  Consulted with Rhona Raider, NP, who determined that Pt does not meet inpatient criteria.  He was just discharged from [facility] and should have medication.  He also received his injection.  Recommend follow-up with [facility].   Diagnosis: Schizophrenia, unspecified; Methamphetamine Dependency  Past Medical History:  Past Medical History:  Diagnosis Date  . Drug abuse (HCC)   . Hypertension   . Schizophrenia (HCC)   . Tardive dyskinesia     History reviewed. No pertinent surgical history.  Family History: No family history on file.  Social History:  reports that he has been smoking. He has been smoking about 0.50 packs per day. He has never used smokeless tobacco. He reports current alcohol use. He reports current drug use. Drug: Methamphetamines.  Additional Social History:  Alcohol / Drug Use Pain Medications: Please see MAR Prescriptions: Please see MAR Over the Counter: Please see MAR History of alcohol / drug use?: Yes Longest period of sobriety (when/how long): unknown Negative Consequences of Use: Financial,Personal relationships,Work / School,Legal Withdrawal Symptoms: Agitation Substance #1 Name of Substance 1: Methamphetamines 1 - Amount (size/oz): Varied 1 - Frequency: Daily 1 - Duration: Ongoing 1 - Last Use / Amount: Pt denied any recent use; however, UDS indicated the presence of amphetamines  CIWA: CIWA-Ar BP: 125/73 Pulse Rate: 84 Nausea and Vomiting: no nausea and no vomiting Tactile Disturbances: none Tremor: not visible, but can be felt fingertip to fingertip  Auditory Disturbances: not present Paroxysmal Sweats: no sweat visible Visual Disturbances: mild sensitivity Anxiety: mildly anxious Headache, Fullness in Head: moderate Agitation: somewhat more than normal activity Orientation and Clouding of Sensorium: oriented and  can do serial additions CIWA-Ar Total: 8 COWS:    Allergies:  Allergies  Allergen Reactions  . Cogentin [Benztropine]   . Geodon [Ziprasidone]     Severe diarrehea  . Haldol [Haloperidol Lactate]   . Keflex [Cephalexin]     Home Medications: (Not in a hospital admission)   OB/GYN Status:  No LMP for male patient.  General Assessment Data Location of Assessment: [facility] ED Date Telepsych consult ordered in [facility]: 11/30/20 Marital status: Single                                                 Advance Directives (For Healthcare) Does Patient Have a Medical Advance Directive?: No Would patient like information on creating a medical advance directive?: No - Patient declined          Disposition:   Pt is psych-cleared.  This service was provided via telemedicine using a 2-way, interactive audio and video technology.  Names of all persons participating in this telemedicine service and their role in this encounter. Name: Cole Henderson Henderson Role: Patient             Earline Mayotte 12/01/2020 9:15 AM

## 2020-12-01 NOTE — ED Notes (Signed)
Pt taken to rm 53 for TTS.

## 2020-12-01 NOTE — ED Provider Notes (Signed)
Psychiatry is recommending discharge and follow-up with outpatient [facility].  When I discussed this with patient, he became angry and frustrated stating he wants to be inpatient.  He is now yelling me to leave.  He will be discharged.   Pricilla Loveless, MD 12/01/20 1308

## 2020-12-01 NOTE — ED Provider Notes (Signed)
[facility] EMERGENCY DEPARTMENT Provider Note   CSN: [id number] Arrival date & time: 11/30/20  2216     History Chief Complaint  Patient presents with  . Hallucinations    Cole Henderson is a 44 y.o. male.  Patient to ED with request for psychiatric evaluation. He has a history of schizophrenia, associated tardive dyskinesia, HTN, substance abuse, presents with auditory hallucinations. He reports he does not remember when he got his last dose of Prolixin and is out of Artane, Zoloft, gabapentin and Lisinopril. No denies wanting to harm himself or others. "I know when I've crossed the line and I need help".   The history is provided by the patient. No language interpreter was used.       Past Medical History:  Diagnosis Date  . Drug abuse (HCC)   . Hypertension   . Schizophrenia (HCC)   . Tardive dyskinesia     Patient Active Problem List   Diagnosis Date Noted  . MIGRAINE, UNSPEC., W/O INTRACTABLE MIGRAINE 01/31/2007  . HYPERTENSION, BENIGN SYSTEMIC 01/31/2007  . GASTROESOPHAGEAL REFLUX, NO ESOPHAGITIS 01/31/2007    History reviewed. No pertinent surgical history.     No family history on file.  Social History   Tobacco Use  . Smoking status: Current Every Day Smoker    Packs/day: 0.50  . Smokeless tobacco: Never Used  Substance Use Topics  . Alcohol use: Yes  . Drug use: Yes    Types: Methamphetamines    Home Medications Prior to Admission medications   Medication Sig Start Date End Date Taking? Authorizing Provider  gabapentin (NEURONTIN) 100 MG capsule Take 2 capsules (200 mg total) by mouth 3 (three) times daily. 08/30/20   Money, Gerlene Burdock, FNP  sertraline (ZOLOFT) 50 MG tablet Take 1 tablet (50 mg total) by mouth daily. 08/30/20   Money, Gerlene Burdock, FNP  trihexyphenidyl (ARTANE) 5 MG tablet Take 1 tablet (5 mg total) by mouth 2 (two) times daily with a meal. 08/30/20   Money, Gerlene Burdock, FNP    Allergies    Cogentin [benztropine],  Geodon [ziprasidone], Haldol [haloperidol lactate], and Keflex [cephalexin]  Review of Systems   Review of Systems  Constitutional: Negative for chills and fever.  HENT: Negative.   Respiratory: Negative.   Cardiovascular: Negative.   Gastrointestinal: Negative.   Musculoskeletal: Negative.   Skin: Negative.   Neurological: Negative.   Psychiatric/Behavioral: Positive for hallucinations.    Physical Exam Updated Vital Signs BP 140/88   Pulse 92   Temp 98.3 F (36.8 C) (Oral)   Resp 18   Ht 5\' 10"  (1.778 m)   Wt 88.5 kg   SpO2 99%   BMI 27.99 kg/m   Physical Exam Vitals and nursing note reviewed.  Constitutional:      General: He is not in acute distress.    Appearance: He is well-developed and well-nourished.  Pulmonary:     Effort: Pulmonary effort is normal.  Musculoskeletal:        General: Normal range of motion.     Cervical back: Normal range of motion.  Skin:    General: Skin is warm and dry.  Neurological:     Mental Status: He is alert and oriented to person, place, and time.  Psychiatric:        Attention and Perception: He perceives auditory hallucinations.        Mood and Affect: Affect is blunt.        Speech: Speech is rapid and pressured.  Behavior: Behavior is cooperative.        Thought Content: Thought content does not include homicidal or suicidal ideation.     ED Results / Procedures / Treatments   Labs (all labs ordered are listed, but only abnormal results are displayed) Labs Reviewed  COMPREHENSIVE METABOLIC PANEL - Abnormal; Notable for the following components:      Result Value   Glucose, Bld 118 (*)    BUN 27 (*)    Total Protein 6.4 (*)    All other components within normal limits  SALICYLATE LEVEL - Abnormal; Notable for the following components:   Salicylate Lvl <7.0 (*)    All other components within normal limits  ACETAMINOPHEN LEVEL - Abnormal; Notable for the following components:   Acetaminophen (Tylenol), Serum  <10 (*)    All other components within normal limits  CBC - Abnormal; Notable for the following components:   RBC 4.11 (*)    Hemoglobin 11.5 (*)    HCT 35.3 (*)    All other components within normal limits  RAPID URINE DRUG SCREEN, HOSP PERFORMED - Abnormal; Notable for the following components:   Amphetamines POSITIVE (*)    All other components within normal limits  ETHANOL   Results for orders placed or performed during the hospital encounter of 11/30/20  Comprehensive metabolic panel  Result Value Ref Range   Sodium 135 135 - 145 mmol/L   Potassium 4.2 3.5 - 5.1 mmol/L   Chloride 101 98 - 111 mmol/L   CO2 24 22 - 32 mmol/L   Glucose, Bld 118 (H) 70 - 99 mg/dL   BUN 27 (H) 6 - 20 mg/dL   Creatinine, Ser 9.75 0.61 - 1.24 mg/dL   Calcium 9.1 8.9 - 88.3 mg/dL   Total Protein 6.4 (L) 6.5 - 8.1 g/dL   Albumin 3.9 3.5 - 5.0 g/dL   AST 25 15 - 41 U/L   ALT 27 0 - 44 U/L   Alkaline Phosphatase 49 38 - 126 U/L   Total Bilirubin 0.7 0.3 - 1.2 mg/dL   GFR, Estimated >25 >49 mL/min   Anion gap 10 5 - 15  Ethanol  Result Value Ref Range   Alcohol, Ethyl (B) <10 <10 mg/dL  Salicylate level  Result Value Ref Range   Salicylate Lvl <7.0 (L) 7.0 - 30.0 mg/dL  Acetaminophen level  Result Value Ref Range   Acetaminophen (Tylenol), Serum <10 (L) 10 - 30 ug/mL  cbc  Result Value Ref Range   WBC 8.7 4.0 - 10.5 K/uL   RBC 4.11 (L) 4.22 - 5.81 MIL/uL   Hemoglobin 11.5 (L) 13.0 - 17.0 g/dL   HCT 82.6 (L) 41.5 - 83.0 %   MCV 85.9 80.0 - 100.0 fL   MCH 28.0 26.0 - 34.0 pg   MCHC 32.6 30.0 - 36.0 g/dL   RDW 94.0 76.8 - 08.8 %   Platelets 310 150 - 400 K/uL   nRBC 0.0 0.0 - 0.2 %  Rapid urine drug screen (hospital performed)  Result Value Ref Range   Opiates NONE DETECTED NONE DETECTED   Cocaine NONE DETECTED NONE DETECTED   Benzodiazepines NONE DETECTED NONE DETECTED   Amphetamines POSITIVE (A) NONE DETECTED   Tetrahydrocannabinol NONE DETECTED NONE DETECTED   Barbiturates NONE  DETECTED NONE DETECTED    EKG None  Radiology No results found.  Procedures Procedures (including critical care time)  Medications Ordered in ED Medications  nicotine (NICODERM CQ - dosed in mg/24 hours) patch 21  mg (21 mg Transdermal Patch Applied 12/01/20 0402)  acetaminophen (TYLENOL) tablet 1,000 mg (1,000 mg Oral Given 12/01/20 0402)    ED Course  I have reviewed the triage vital signs and the nursing notes.  Pertinent labs & imaging results that were available during my care of the patient were reviewed by me and considered in my medical decision making (see chart for details).    MDM Rules/Calculators/A&P                          Patient to ED with auditory hallucinations. Denies they are command voices. Denies SI/HI, but states that "people get on my nerves" and "I know when I've crossed a line and need help".   He complains of headache, history of migraine. For now, tylenol and nicotine patch provided. Will observe for improvement.   TTS to evaluate and determine need for placement vs recommendation on restarting medications.   Final Clinical Impression(s) / ED Diagnoses Final diagnoses:  None   1. Auditory hallucinations 2. History of schizophrenia  Rx / DC Orders ED Discharge Orders    None       Elpidio Anis, PA-C 12/01/20 0554    Nira Conn, MD 12/01/20 [phone]

## 2020-12-01 NOTE — ED Notes (Signed)
Received at this time from lobby, ambulatory with steady gait.

## 2020-12-01 NOTE — ED Notes (Signed)
Pt refusing to let us get vitals from him. Pt cussing up a storm and threatening staff. Security was called. Security is at bedside escorting pt out. Pt is being discharged.

## 2020-12-01 NOTE — ED Notes (Signed)
Lunch Tray Ordered @ 1038.  

## 2020-12-01 NOTE — ED Notes (Signed)
Pt returned to floor

## 2020-12-01 NOTE — ED Notes (Signed)
2 bags locked in locker 6 in purple zone.

## 2020-12-01 NOTE — ED Notes (Signed)
pts valuables surrendered to security with envelope # [id number].

## 2021-02-14 ENCOUNTER — Emergency Department (HOSPITAL_COMMUNITY)
Admission: EM | Admit: 2021-02-14 | Discharge: 2021-02-14 | Disposition: A | Discharge status: Home / Self Care | Payer: Self-pay | Attending: Emergency Medicine | Admitting: Emergency Medicine

## 2021-02-14 ENCOUNTER — Emergency Department (HOSPITAL_COMMUNITY)
Admission: EM | Admit: 2021-02-14 | Discharge: 2021-02-15 | Disposition: A | Discharge status: Home / Self Care | Payer: Self-pay | Attending: Emergency Medicine | Admitting: Emergency Medicine

## 2021-02-14 ENCOUNTER — Other Ambulatory Visit: Payer: Self-pay

## 2021-02-14 ENCOUNTER — Encounter (HOSPITAL_COMMUNITY): Payer: Self-pay | Admitting: Emergency Medicine

## 2021-02-14 ENCOUNTER — Encounter (HOSPITAL_COMMUNITY): Payer: Self-pay

## 2021-02-14 DIAGNOSIS — F1510 Other stimulant abuse, uncomplicated: Secondary | ICD-10-CM | POA: Insufficient documentation

## 2021-02-14 DIAGNOSIS — R440 Auditory hallucinations: Secondary | ICD-10-CM

## 2021-02-14 DIAGNOSIS — I10 Essential (primary) hypertension: Secondary | ICD-10-CM | POA: Insufficient documentation

## 2021-02-14 DIAGNOSIS — F17200 Nicotine dependence, unspecified, uncomplicated: Secondary | ICD-10-CM | POA: Insufficient documentation

## 2021-02-14 DIAGNOSIS — F209 Schizophrenia, unspecified: Secondary | ICD-10-CM | POA: Insufficient documentation

## 2021-02-14 DIAGNOSIS — Z79899 Other long term (current) drug therapy: Secondary | ICD-10-CM | POA: Insufficient documentation

## 2021-02-14 LAB — CBC
HCT: 39 % (ref 39.0–52.0)
HCT: 36.3 % — ABNORMAL LOW (ref 39.0–52.0)
Hemoglobin: 12.3 g/dL — ABNORMAL LOW (ref 13.0–17.0)
Hemoglobin: 11.6 g/dL — ABNORMAL LOW (ref 13.0–17.0)
MCH: 26.8 pg (ref 26.0–34.0)
MCH: 26.9 pg (ref 26.0–34.0)
MCHC: 31.5 g/dL (ref 30.0–36.0)
MCHC: 32 g/dL (ref 30.0–36.0)
MCV: 85 fL (ref 80.0–100.0)
MCV: 84 fL (ref 80.0–100.0)
Platelets: 256 10*3/uL (ref 150–400)
Platelets: 237 10*3/uL (ref 150–400)
RBC: 4.59 MIL/uL (ref 4.22–5.81)
RBC: 4.32 MIL/uL (ref 4.22–5.81)
RDW: 15.5 % (ref 11.5–15.5)
RDW: 15.5 % (ref 11.5–15.5)
WBC: 7.5 10*3/uL (ref 4.0–10.5)
WBC: 7.9 10*3/uL (ref 4.0–10.5)
nRBC: 0 % (ref 0.0–0.2)
nRBC: 0 % (ref 0.0–0.2)

## 2021-02-14 LAB — ETHANOL
Alcohol, Ethyl (B): <10 mg/dL (ref <10)
Alcohol, Ethyl (B): <10 mg/dL (ref <10)

## 2021-02-14 LAB — COMPREHENSIVE METABOLIC PANEL
ALT: 25 U/L (ref 0–44)
ALT: 23 U/L (ref 0–44)
AST: 25 U/L (ref 15–41)
AST: 23 U/L (ref 15–41)
Albumin: 3.8 g/dL (ref 3.5–5.0)
Albumin: 4 g/dL (ref 3.5–5.0)
Alkaline Phosphatase: 43 U/L (ref 38–126)
Alkaline Phosphatase: 41 U/L (ref 38–126)
Anion gap: 8 (ref 5–15)
Anion gap: 9 (ref 5–15)
BUN: 18 mg/dL (ref 6–20)
BUN: 20 mg/dL (ref 6–20)
CO2: 26 mmol/L (ref 22–32)
CO2: 23 mmol/L (ref 22–32)
Calcium: 9.2 mg/dL (ref 8.9–10.3)
Calcium: 8.8 mg/dL — ABNORMAL LOW (ref 8.9–10.3)
Chloride: 102 mmol/L (ref 98–111)
Chloride: 104 mmol/L (ref 98–111)
Creatinine, Ser: 0.94 mg/dL (ref 0.61–1.24)
Creatinine, Ser: 0.82 mg/dL (ref 0.61–1.24)
GFR, Estimated: >60 mL/min (ref >60)
GFR, Estimated: >60 mL/min (ref >60)
Glucose, Bld: 107 mg/dL — ABNORMAL HIGH (ref 70–99)
Glucose, Bld: 120 mg/dL — ABNORMAL HIGH (ref 70–99)
Potassium: 4.1 mmol/L (ref 3.5–5.1)
Potassium: 4 mmol/L (ref 3.5–5.1)
Sodium: 136 mmol/L (ref 135–145)
Sodium: 136 mmol/L (ref 135–145)
Total Bilirubin: 0.9 mg/dL (ref 0.3–1.2)
Total Bilirubin: 0.7 mg/dL (ref 0.3–1.2)
Total Protein: 6.5 g/dL (ref 6.5–8.1)
Total Protein: 6.7 g/dL (ref 6.5–8.1)

## 2021-02-14 LAB — ACETAMINOPHEN LEVEL: Acetaminophen (Tylenol), Serum: <10 ug/mL — ABNORMAL LOW (ref 10–30)

## 2021-02-14 LAB — SALICYLATE LEVEL: Salicylate Lvl: <7 mg/dL — ABNORMAL LOW (ref 7.0–30.0)

## 2021-02-14 MED ORDER — NICOTINE 21 MG/24HR TD PT24
21.0000 mg | MEDICATED_PATCH | Freq: Once | TRANSDERMAL | Status: DC
  Administered 2021-02-14: 21 mg via TRANSDERMAL
  Filled 2021-02-14: qty 1

## 2021-02-14 MED ORDER — LISINOPRIL 20 MG PO TABS
40.0000 mg | ORAL_TABLET | Freq: Once | ORAL | Status: AC
  Administered 2021-02-14: 40 mg via ORAL
  Filled 2021-02-14: qty 2

## 2021-02-14 MED ORDER — TRIHEXYPHENIDYL HCL 5 MG PO TABS
5.0000 mg | ORAL_TABLET | Freq: Two times a day (BID) | ORAL | Status: DC
  Administered 2021-02-14: 5 mg via ORAL
  Filled 2021-02-14 (×2): qty 1

## 2021-02-14 NOTE — ED Provider Notes (Signed)
[facility] EMERGENCY DEPARTMENT Provider Note   CSN: [id number] Arrival date & time: 02/14/21  1452     History Chief Complaint  Patient presents with  . Medical Clearance    [name] is a 45 y.o. male.  The history is provided by the patient and medical records.   Cole Henderson is a 45 y.o. male who presents to the Emergency Department complaining of rehab. He presents the emergency department requesting help with his methamphetamine abuse. He states that he uses meth daily and has not slept in the last week. He smokes it or snorts it. Last use was last night. He is requesting treatment for rehab. He denies any SI, HI, hallucinations. He does have a history of schizophrenia. He is compliant with his home medications. He states that if he is discharged from the emergency department he will just go to use again and he is requesting assistance. Symptoms are moderate and constant nature. Denies any additional symptoms on review of systems.    Past Medical History:  Diagnosis Date  . Drug abuse (HCC)   . Hypertension   . Schizophrenia (HCC)   . Tardive dyskinesia     Patient Active Problem List   Diagnosis Date Noted  . Schizophrenia (HCC)   . MIGRAINE, UNSPEC., W/O INTRACTABLE MIGRAINE 01/31/2007  . HYPERTENSION, BENIGN SYSTEMIC 01/31/2007  . GASTROESOPHAGEAL REFLUX, NO ESOPHAGITIS 01/31/2007    History reviewed. No pertinent surgical history.     No family history on file.  Social History   Tobacco Use  . Smoking status: Current Every Day Smoker    Packs/day: 0.50  . Smokeless tobacco: Never Used  Substance Use Topics  . Alcohol use: Yes  . Drug use: Yes    Types: Methamphetamines    Home Medications Prior to Admission medications   Medication Sig Start Date End Date Taking? Authorizing Provider  Aspirin-Acetaminophen-Caffeine (GOODY HEADACHE PO) Take 3 packets by mouth every morning.   Yes [provider]  fluPHENAZine  decanoate (PROLIXIN) 25 MG/ML injection Inject 12.5 mg into the muscle every 28 (twenty-eight) days. 11/20/20  Yes [provider]  sertraline (ZOLOFT) 100 MG tablet Take 100 mg by mouth every morning. 11/20/20  Yes [provider]  trihexyphenidyl (ARTANE) 5 MG tablet Take 1 tablet (5 mg total) by mouth 2 (two) times daily with a meal. 08/30/20  Yes Money, Gerlene Burdock, FNP  gabapentin (NEURONTIN) 100 MG capsule Take 2 capsules (200 mg total) by mouth 3 (three) times daily. Patient not taking: Reported on 02/14/2021 08/30/20   Money, Gerlene Burdock, FNP  lisinopril (ZESTRIL) 40 MG tablet Take 40 mg by mouth daily. Patient not taking: No sig reported    [provider]    Allergies    Fish allergy, Shellfish allergy, Cogentin [benztropine], Haldol [haloperidol lactate], Keflex [cephalexin], Risperidone and related, Strawberry extract, and Geodon [ziprasidone]  Review of Systems   Review of Systems  All other systems reviewed and are negative.   Physical Exam Updated Vital Signs BP 128/85   Pulse 99   Temp 98.5 F (36.9 C) (Oral)   Resp 20   SpO2 99%   Physical Exam Vitals and nursing note reviewed.  Constitutional:      Appearance: He is well-developed.  HENT:     Head: Normocephalic and atraumatic.  Cardiovascular:     Rate and Rhythm: Normal rate and regular rhythm.  Pulmonary:     Effort: Pulmonary effort is normal. No respiratory distress.  Abdominal:  Palpations: Abdomen is soft.     Tenderness: There is no abdominal tenderness. There is no guarding or rebound.  Musculoskeletal:        General: No tenderness.  Skin:    General: Skin is warm and dry.  Neurological:     Mental Status: He is alert and oriented to person, place, and time.  Psychiatric:        Behavior: Behavior normal.     Comments: Calm and appropriate affect.     ED Results / Procedures / Treatments   Labs (all labs ordered are listed, but only abnormal results are  displayed) Labs Reviewed  COMPREHENSIVE METABOLIC PANEL - Abnormal; Notable for the following components:      Result Value   Glucose, Bld 107 (*)    All other components within normal limits  SALICYLATE LEVEL - Abnormal; Notable for the following components:   Salicylate Lvl <7.0 (*)    All other components within normal limits  ACETAMINOPHEN LEVEL - Abnormal; Notable for the following components:   Acetaminophen (Tylenol), Serum <10 (*)    All other components within normal limits  CBC - Abnormal; Notable for the following components:   Hemoglobin 12.3 (*)    All other components within normal limits  ETHANOL  RAPID URINE DRUG SCREEN, HOSP PERFORMED    EKG None  Radiology No results found.  Procedures Procedures   Medications Ordered in ED Medications  trihexyphenidyl (ARTANE) tablet 5 mg (5 mg Oral Given 02/14/21 1738)  nicotine (NICODERM CQ - dosed in mg/24 hours) patch 21 mg (21 mg Transdermal Patch Applied 02/14/21 1737)    ED Course  I have reviewed the triage vital signs and the nursing notes.  Pertinent labs & imaging results that were available during my care of the patient were reviewed by me and considered in my medical decision making (see chart for details).    MDM Rules/Calculators/A&P                         Pt presents to the ED requesting rehab for methamphetamine abuse. He does have a history of schizophrenia but is calm, appropriate and not actively hallucinating in the emergency department. No active SI/HI. He does not meet inpatient psychiatric criteria at this time. Peer support consulted and resources were provided for rehab for patient. Plan to discharge with outpatient resources.  Final Clinical Impression(s) / ED Diagnoses Final diagnoses:  Methamphetamine abuse [facility])    Rx / DC Orders ED Discharge Orders    None       Tilden Fossa, MD 02/14/21 2018

## 2021-02-14 NOTE — ED Provider Notes (Signed)
[facility]-EMERGENCY DEPT Provider Note   CSN: [id number] Arrival date & time: 02/14/21  2116     History Chief Complaint  Patient presents with  . out of meds    Cole Henderson is a 45 y.o. male with a hx of schizophrenia, tardive dyskinesia, hypertension, substance abuse presents to the Emergency Department complaining of constant, progressively worsening auditory hallucinations onset many years ago but worsening recently.  He reports that someone named "Jorja Loa" lives in his head and talks to him all the time.  Patient reports he got his last dose of Prolixin on Monday 3/7 but has not been taking his other medications.  He reports worsening auditory hallucinations.  He states he smoked methamphetamine at 11 PM last night but has not slept in a week.  He reports both snorting and smoking methamphetamine.  He request admission for detox and help with his hallucinations.  Denies suicidal or homicidal ideation.  Patient admits he is homeless.  Records reviewed.  Patient was evaluated earlier today at [facility]  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  He was given referrals for rehab and peers support was consulted.  He was discharged with outpatient resources.  The history is provided by the patient and medical records. No language interpreter was used.       Past Medical History:  Diagnosis Date  . Drug abuse (HCC)   . Hypertension   . Schizophrenia (HCC)   . Tardive dyskinesia     Patient Active Problem List   Diagnosis Date Noted  . Schizophrenia (HCC)   . MIGRAINE, UNSPEC., W/O INTRACTABLE MIGRAINE 01/31/2007  . HYPERTENSION, BENIGN SYSTEMIC 01/31/2007  . GASTROESOPHAGEAL REFLUX, NO ESOPHAGITIS 01/31/2007    History reviewed. No pertinent surgical history.     History reviewed. No pertinent family history.  Social History   Tobacco Use  . Smoking status: Current Every  Day Smoker    Packs/day: 0.50  . Smokeless tobacco: Never Used  Substance Use Topics  . Alcohol use: Yes  . Drug use: Yes    Types: Methamphetamines    Home Medications Prior to Admission medications   Medication Sig Start Date End Date Taking? Authorizing Provider  Aspirin-Acetaminophen-Caffeine (GOODY HEADACHE PO) Take 3 packets by mouth every morning.    [provider]  fluPHENAZine decanoate (PROLIXIN) 25 MG/ML injection Inject 12.5 mg into the muscle every 28 (twenty-eight) days. 11/20/20   [provider]  gabapentin (NEURONTIN) 100 MG capsule Take 2 capsules (200 mg total) by mouth 3 (three) times daily. Patient not taking: Reported on 02/14/2021 08/30/20   Money, Gerlene Burdock, FNP  lisinopril (ZESTRIL) 40 MG tablet Take 1 tablet (40 mg total) by mouth daily. 02/15/21   Yossef Gilkison, Dahlia Client, PA-C  sertraline (ZOLOFT) 100 MG tablet Take 100 mg by mouth every morning. 11/20/20   [provider]  trihexyphenidyl (ARTANE) 5 MG tablet Take 1 tablet (5 mg total) by mouth 2 (two) times daily with a meal. 08/30/20   Money, Gerlene Burdock, FNP    Allergies    Fish allergy, Shellfish allergy, Cogentin [benztropine], Haldol [haloperidol lactate], Keflex [cephalexin], Risperidone and related, Strawberry extract, and Geodon [ziprasidone]  Review of Systems   Review of Systems  Constitutional: Negative for appetite change, diaphoresis, fatigue, fever and unexpected weight change.  HENT: Negative for mouth sores.   Eyes: Negative for visual disturbance.  Respiratory: Negative for cough, chest tightness, shortness of breath and wheezing.  Cardiovascular: Negative for chest pain.  Gastrointestinal: Negative for abdominal pain, constipation, diarrhea, nausea and vomiting.  Endocrine: Negative for polydipsia, polyphagia and polyuria.  Genitourinary: Negative for dysuria, frequency, hematuria and urgency.  Musculoskeletal: Negative for back pain and neck stiffness.  Skin:  Negative for rash.  Allergic/Immunologic: Negative for immunocompromised state.  Neurological: Negative for syncope, light-headedness and headaches.  Hematological: Does not bruise/bleed easily.  Psychiatric/Behavioral: Positive for hallucinations. Negative for sleep disturbance. The patient is not nervous/anxious.     Physical Exam Updated Vital Signs BP (!) 152/98 (BP Location: Left Arm)   Pulse 94   Temp 98.1 F (36.7 C) (Oral)   Resp 18   SpO2 100%   Physical Exam Vitals and nursing note reviewed.  Constitutional:      General: He is not in acute distress.    Appearance: He is not diaphoretic.  HENT:     Head: Normocephalic.  Eyes:     General: No scleral icterus.    Conjunctiva/sclera: Conjunctivae normal.  Cardiovascular:     Rate and Rhythm: Normal rate and regular rhythm.     Pulses: Normal pulses.          Radial pulses are 2+ on the right side and 2+ on the left side.  Pulmonary:     Effort: No tachypnea, accessory muscle usage, prolonged expiration, respiratory distress or retractions.     Breath sounds: No stridor.     Comments: Equal chest rise. No increased work of breathing. Abdominal:     General: There is no distension.     Palpations: Abdomen is soft.     Tenderness: There is no abdominal tenderness. There is no guarding or rebound.  Musculoskeletal:     Cervical back: Normal range of motion.     Comments: Moves all extremities equally and without difficulty.  Skin:    General: Skin is warm and dry.     Capillary Refill: Capillary refill takes less than 2 seconds.  Neurological:     Mental Status: He is alert.     GCS: GCS eye subscore is 4. GCS verbal subscore is 5. GCS motor subscore is 6.     Comments: Speech is clear and goal oriented.  Psychiatric:        Mood and Affect: Mood normal.        Behavior: Behavior normal.        Thought Content: Thought content does not include homicidal or suicidal ideation. Thought content does not include  homicidal or suicidal plan.     Comments: Patient reports auditory hallucinations but does not appear to be responding to internal stimuli.  Is able to carry on a consistent and coherent conversation.     ED Results / Procedures / Treatments   Labs (all labs ordered are listed, but only abnormal results are displayed) Labs Reviewed  COMPREHENSIVE METABOLIC PANEL - Abnormal; Notable for the following components:      Result Value   Glucose, Bld 120 (*)    Calcium 8.8 (*)    All other components within normal limits  CBC - Abnormal; Notable for the following components:   Hemoglobin 11.6 (*)    HCT 36.3 (*)    All other components within normal limits  RAPID URINE DRUG SCREEN, HOSP PERFORMED - Abnormal; Notable for the following components:   Amphetamines POSITIVE (*)    All other components within normal limits  ETHANOL    Procedures Procedures   Medications Ordered in ED Medications  lisinopril (  ZESTRIL) tablet 40 mg (40 mg Oral Given 02/14/21 2239)    ED Course  I have reviewed the triage vital signs and the nursing notes.  Pertinent labs & imaging results that were available during my care of the patient were reviewed by me and considered in my medical decision making (see chart for details).  Clinical Course as of 02/15/21 0417  Mon Feb 14, 2021  2224 BP(!): 152/98 Noted -patient with history of same and reports he has not been taking his medications.  Home meds ordered. [HM]    Clinical Course User Index [HM] Liannah Yarbough, Boyd Kerbs   MDM Rules/Calculators/A&P                           Patient presents with complaints of auditory hallucinations and methamphetamine abuse.  He was evaluated earlier today but did not have a TTS evaluation.  Will have TTS assess.  Suspect patient will not meet inpatient criteria.  3:08 AM Pt being evaluated by TTS now.  4:10 AM Pt assessed by TTS counselor Carlos American, LMFT.  Patient does not meet inpatient criteria.  Referral to  peer support and outpatient resources given.  Patient remains alert and oriented here in the emergency department.      Final Clinical Impression(s) / ED Diagnoses Final diagnoses:  Schizophrenia, unspecified type (HCC)  Auditory hallucinations  Hypertension, unspecified type    Rx / DC Orders ED Discharge Orders         Ordered    lisinopril (ZESTRIL) 40 MG tablet  Daily        02/15/21 0413           Talina Pleitez, Dahlia Client, PA-C 02/15/21 0534    Charlynne Pander, MD 02/15/21 [phone]

## 2021-02-14 NOTE — ED Triage Notes (Signed)
Pt is hearing voices because he's been off his meds for a week, pt is schizophrenic Pt denies SI or HI

## 2021-02-14 NOTE — ED Triage Notes (Signed)
Patient from home, states trying to get into rehab was told he needs a referral. A&Ox4.

## 2021-02-15 LAB — RAPID URINE DRUG SCREEN, HOSP PERFORMED
Amphetamines: POSITIVE — AB
Barbiturates: NOT DETECTED
Benzodiazepines: NOT DETECTED
Cocaine: NOT DETECTED
Opiates: NOT DETECTED
Tetrahydrocannabinol: NOT DETECTED

## 2021-02-15 MED ORDER — LISINOPRIL 40 MG PO TABS: 40.0000 mg | ORAL_TABLET | Freq: Every day | ORAL | 0 refills | Status: DC
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
## 2021-02-15 NOTE — ED Notes (Signed)
Pt refuses further vitals. Respirations are even and unlabored, NADN.

## 2021-02-15 NOTE — Discharge Instructions (Signed)
1. Medications: usual home medications 2. Treatment: rest, drink plenty of fluids,  3. Follow Up: Please followup with your primary doctor in 1 week days for discussion of your diagnoses and further evaluation after today's visit; if you do not have a primary care doctor use the resource guide provided to find one; Please return to the ER for new or worsening symptoms

## 2022-05-10 ENCOUNTER — Emergency Department (HOSPITAL_COMMUNITY)
Admission: EM | Admit: 2022-05-10 | Discharge: 2022-05-11 | Disposition: A | Discharge status: Home / Self Care | Payer: Self-pay | Attending: Emergency Medicine | Admitting: Emergency Medicine

## 2022-05-10 ENCOUNTER — Encounter (HOSPITAL_COMMUNITY): Payer: Self-pay

## 2022-05-10 ENCOUNTER — Other Ambulatory Visit: Payer: Self-pay

## 2022-05-10 DIAGNOSIS — Z760 Encounter for issue of repeat prescription: Secondary | ICD-10-CM | POA: Insufficient documentation

## 2022-05-10 DIAGNOSIS — Z7982 Long term (current) use of aspirin: Secondary | ICD-10-CM | POA: Insufficient documentation

## 2022-05-10 DIAGNOSIS — F200 Paranoid schizophrenia: Secondary | ICD-10-CM | POA: Insufficient documentation

## 2022-05-10 DIAGNOSIS — Z79899 Other long term (current) drug therapy: Secondary | ICD-10-CM | POA: Insufficient documentation

## 2022-05-10 MED ORDER — FLUPHENAZINE DECANOATE 25 MG/ML IJ SOLN
12.5000 mg | Freq: Once | INTRAMUSCULAR | Status: DC
Start: 2022-05-11 — End: 2022-05-11
  Filled 2022-05-10: qty 0.5

## 2022-05-10 MED ORDER — FLUPHENAZINE HCL 2.5 MG/ML IJ SOLN
12.5000 mg | Freq: Once | INTRAMUSCULAR | Status: DC
  Filled 2022-05-10: qty 5

## 2022-05-10 MED ORDER — LISINOPRIL 20 MG PO TABS
20.0000 mg | ORAL_TABLET | Freq: Every day | ORAL | 0 refills | Status: DC
Start: 2022-05-10 — End: 2022-08-13

## 2022-05-10 NOTE — ED Provider Notes (Signed)
[facility] EMERGENCY DEPARTMENT Provider Note   CSN: [id number] Arrival date & time: 05/10/22  1848     History  Chief Complaint  Patient presents with   Medication Refill    Keigan Tafoya is a 46 y.o. male.   Medication Refill   Pt is recently out of jail - has been on prolixin for paranoid schizophrenia - and needing his meds - can't see psych until Monday - tried today but they wouldn't see him till Monday - feels he is losing his cool - but still has it together - doesn't want to miss his dose - no other sx at this time.   Home Medications Prior to Admission medications   Medication Sig Start Date End Date Taking? Authorizing Provider  Aspirin-Acetaminophen-Caffeine (GOODY HEADACHE PO) Take 3 packets by mouth every morning.    [provider]  fluPHENAZine decanoate (PROLIXIN) 25 MG/ML injection Inject 12.5 mg into the muscle every 28 (twenty-eight) days. 11/20/20   [provider]  gabapentin (NEURONTIN) 100 MG capsule Take 2 capsules (200 mg total) by mouth 3 (three) times daily. Patient not taking: Reported on 02/14/2021 08/30/20   Money, Gerlene Burdock, FNP  lisinopril (ZESTRIL) 20 MG tablet Take 1 tablet (20 mg total) by mouth daily. 05/10/22 06/09/22  Eber Hong, MD  sertraline (ZOLOFT) 100 MG tablet Take 100 mg by mouth every morning. 11/20/20   [provider]  trihexyphenidyl (ARTANE) 5 MG tablet Take 1 tablet (5 mg total) by mouth 2 (two) times daily with a meal. 08/30/20   Money, Gerlene Burdock, FNP      Allergies    Benztropine mesylate, Fish allergy, Haloperidol, Haloperidol lactate, Shellfish allergy, Cephalexin, Cogentin [benztropine], Risperidone, Risperidone and related, Strawberry extract, and Geodon [ziprasidone]    Review of Systems   Review of Systems  All other systems reviewed and are negative.  Physical Exam Updated Vital Signs BP (!) 177/107 (BP Location: Right Arm)   Pulse (!) 116   Temp 98.8 F (37.1 C) (Oral)    Resp 18   Ht 1.778 m (5\' 10" )   Wt 90.3 kg   SpO2 100%   BMI 28.55 kg/m  Physical Exam Vitals and nursing note reviewed.  Constitutional:      General: He is not in acute distress.    Appearance: He is well-developed.  HENT:     Head: Normocephalic and atraumatic.     Mouth/Throat:     Pharynx: No oropharyngeal exudate.  Eyes:     General: No scleral icterus.       Right eye: No discharge.        Left eye: No discharge.     Conjunctiva/sclera: Conjunctivae normal.     Pupils: Pupils are equal, round, and reactive to light.  Neck:     Thyroid: No thyromegaly.     Vascular: No JVD.  Cardiovascular:     Rate and Rhythm: Normal rate and regular rhythm.     Heart sounds: Normal heart sounds. No murmur heard.   No friction rub. No gallop.  Pulmonary:     Effort: Pulmonary effort is normal. No respiratory distress.     Breath sounds: Normal breath sounds. No wheezing or rales.  Abdominal:     General: Bowel sounds are normal. There is no distension.     Palpations: Abdomen is soft. There is no mass.     Tenderness: There is no abdominal tenderness.  Musculoskeletal:        General: No  tenderness. Normal range of motion.     Cervical back: Normal range of motion and neck supple.  Lymphadenopathy:     Cervical: No cervical adenopathy.  Skin:    General: Skin is warm and dry.     Findings: No erythema or rash.  Neurological:     Mental Status: He is alert.     Coordination: Coordination normal.  Psychiatric:     Comments: Calm and redirectable No internal stimuli in the room No depression or suicidality. Poor judgement ( has his "life coach" in the room with him )    ED Results / Procedures / Treatments   Labs (all labs ordered are listed, but only abnormal results are displayed) Labs Reviewed - No data to display  EKG None  Radiology No results found.  Procedures Procedures    Medications Ordered in ED Medications  fluPHENAZine (PROLIXIN) injection 12.5  mg (has no administration in time range)    ED Course/ Medical Decision Making/ A&P                           Medical Decision Making Risk Prescription drug management.   This patient is well aware of his dosing, I have discussed his dosing with the pharmacist who agrees that this is the upper end of acceptable and 100 mg is the highest possible dose.  That being said it is better to give a lower dose as it is more safe and he has follow-up this coming week  The patient is agreeable to the plan, the pharmacist will assist in getting this medication from behavioral health  Patient being given intramuscular dose of fluphenazine, I will also represcribe his lisinopril.        Final Clinical Impression(s) / ED Diagnoses Final diagnoses:  Medication refill    Rx / DC Orders ED Discharge Orders          Ordered    lisinopril (ZESTRIL) 20 MG tablet  Daily        05/10/22 2229              Eber Hong, MD 05/10/22 2230

## 2022-05-10 NOTE — Discharge Instructions (Signed)
Please go to your psychiatry appointment tomorrow and on Monday to have ongoing medications refilled.  I have given you a prescription for your lisinopril, you can take it to whichever pharmacy you like.  Take it once a day

## 2022-05-10 NOTE — ED Triage Notes (Signed)
Pt sts he has been in jail for 17 years and has a hx of schizophrenia.   Requesting 50mg  injection of Prolixin.

## 2022-05-10 NOTE — ED Provider Triage Note (Signed)
Emergency Medicine Provider Triage Evaluation Note  Cole Henderson , a 46 y.o. male  was evaluated in triage.  Pt complains of needing Prolixin.  Patient was released in prison 17 years ago, states he needs his Prolixin shot.  Due on Friday, he is in the assessment tomorrow for 2 hours.  Patient is agitated per his own account, denies any SI or HI.Marland Kitchen  Review of Systems  Per HPI dentist complement  Physical Exam  BP (!) 177/107 (BP Location: Right Arm)   Pulse (!) 116   Temp 98.8 F (37.1 C) (Oral)   Resp 18   SpO2 100%  Gen:   Awake, no distress    Resp:  Unlabored MSK:   Moves extremities without difficulty  Other:  No SI/HI  Medical Decision Making  Medically screening exam initiated at 7:26 PM.  Appropriate orders placed.  Cole Henderson was informed that the remainder of the evaluation will be completed by another provider, this initial triage assessment does not replace that evaluation, and the importance of remaining in the ED until their evaluation is complete.     Sherrill Raring, [place] 05/10/22 1928

## 2022-05-11 ENCOUNTER — Encounter (HOSPITAL_COMMUNITY): Payer: Self-pay | Admitting: Emergency Medicine

## 2022-05-11 ENCOUNTER — Emergency Department (HOSPITAL_COMMUNITY)
Admission: EM | Admit: 2022-05-11 | Discharge: 2022-05-11 | Disposition: A | Discharge status: Home / Self Care | Payer: Self-pay | Attending: Emergency Medicine | Admitting: Emergency Medicine

## 2022-05-11 ENCOUNTER — Other Ambulatory Visit: Payer: Self-pay

## 2022-05-11 DIAGNOSIS — Z760 Encounter for issue of repeat prescription: Secondary | ICD-10-CM | POA: Insufficient documentation

## 2022-05-11 DIAGNOSIS — F200 Paranoid schizophrenia: Secondary | ICD-10-CM | POA: Insufficient documentation

## 2022-05-11 DIAGNOSIS — Z7982 Long term (current) use of aspirin: Secondary | ICD-10-CM | POA: Insufficient documentation

## 2022-05-11 MED ORDER — FLUPHENAZINE DECANOATE 25 MG/ML IJ SOLN
12.5000 mg | INTRAMUSCULAR | Status: DC
  Administered 2022-05-11: 12.5 mg via INTRAMUSCULAR
  Filled 2022-05-11: qty 0.5

## 2022-05-11 NOTE — ED Triage Notes (Signed)
Pt here for his psych medication. Pt states he was seen yesterday for the same but did not receive his shot. Pt just got out of jail and can feel that he needs his medication.

## 2022-05-11 NOTE — ED Notes (Addendum)
Pt sts he had to leave and make an appointment and that he would return tomorrow for medication. Left prior to discharge instructions and AVS. Alert, oriented and ambulatory. Sts he will be back for all of it. Declined updating vital signs for discharge.

## 2022-05-11 NOTE — ED Notes (Signed)
Pt requested something to sweet to eat. Pt given soda and graham crackers. Pt continuing to talk to himself in hallway.

## 2022-05-11 NOTE — Discharge Instructions (Addendum)
Follow up with behavioral health  Return for new or worsening symptoms

## 2022-05-11 NOTE — ED Notes (Signed)
Pt refused VS  

## 2022-05-11 NOTE — ED Notes (Signed)
RN reviewed discharge instructions with pt. Pt verbalized understanding and had no further questions. Pt refused VS prior to departure.

## 2022-05-11 NOTE — ED Provider Notes (Signed)
[facility] Provider Note   CSN: [id number] Arrival date & time: 05/11/22  1419     History  Chief Complaint  Patient presents with   Medication Refill    Cole MaudlinKeith Wymore is a 46 y.o. male history of schizophrenia for evaluation requesting medications.  Patient recently out of jail.  Has been on Prolixin for his schizophrenia.  Patient states he typically follows with [facility] [place].  Patient states they cannot get him into a scheduled visit until Monday.  States he went there earlier today however they did not have any walk-in appointments available and he was not able to be seen.  He states that he feels like he will lose his school if he does not get his medications soon.  He does not want to miss any doses.  He denies any SI, HI.  Denies any change in his current symptoms.  No command hallucinations.  States he was seen here yesterday with supposed to get his medication however due to delay in medication he left without receiving this. States intermittent uses substances however none recently.  States his hallucinations did worsen when he used the substance however hallucinations are at baseline.  HPI    Home Medications Prior to Admission medications   Medication Sig Start Date End Date Taking? Authorizing Provider  Aspirin-Acetaminophen-Caffeine (GOODY HEADACHE PO) Take 3 packets by mouth every morning.    [provider]  fluPHENAZine decanoate (PROLIXIN) 25 MG/ML injection Inject 12.5 mg into the muscle every 28 (twenty-eight) days. 11/20/20   [provider]  gabapentin (NEURONTIN) 100 MG capsule Take 2 capsules (200 mg total) by mouth 3 (three) times daily. Patient not taking: Reported on 02/14/2021 08/30/20   Money, Gerlene Burdockravis B, FNP  lisinopril (ZESTRIL) 20 MG tablet Take 1 tablet (20 mg total) by mouth daily. 05/10/22 06/09/22  Eber HongMiller, Brian, MD  sertraline (ZOLOFT) 100 MG tablet Take 100 mg by mouth every  morning. 11/20/20   [provider]  trihexyphenidyl (ARTANE) 5 MG tablet Take 1 tablet (5 mg total) by mouth 2 (two) times daily with a meal. 08/30/20   Money, Gerlene Burdockravis B, FNP      Allergies    Benztropine mesylate, Fish allergy, Haloperidol, Haloperidol lactate, Shellfish allergy, Cephalexin, Cogentin [benztropine], Risperidone, Risperidone and related, Strawberry extract, and Geodon [ziprasidone]    Review of Systems   Review of Systems  Constitutional: Negative.   HENT: Negative.    Respiratory: Negative.    Cardiovascular: Negative.   Gastrointestinal: Negative.   Genitourinary: Negative.   Musculoskeletal: Negative.   Skin: Negative.   Neurological: Negative.   Psychiatric/Behavioral: Negative.    All other systems reviewed and are negative.   Physical Exam Updated Vital Signs BP (!) 170/103   Pulse (!) 105   Temp 98.9 F (37.2 C) (Oral)   Resp 16   SpO2 96%  Physical Exam Vitals and nursing note reviewed.  Constitutional:      General: He is not in acute distress.    Appearance: He is well-developed. He is not ill-appearing, toxic-appearing or diaphoretic.  HENT:     Head: Normocephalic and atraumatic.     Nose: Nose normal.     Mouth/Throat:     Mouth: Mucous membranes are moist.  Eyes:     Pupils: Pupils are equal, round, and reactive to light.  Cardiovascular:     Rate and Rhythm: Normal rate and regular rhythm.     Pulses: Normal pulses.  Heart sounds: Normal heart sounds.  Pulmonary:     Effort: Pulmonary effort is normal. No respiratory distress.     Breath sounds: Normal breath sounds.  Abdominal:     General: Bowel sounds are normal. There is no distension.     Palpations: Abdomen is soft.  Musculoskeletal:        General: Normal range of motion.     Cervical back: Normal range of motion and neck supple.  Skin:    General: Skin is warm and dry.  Neurological:     General: No focal deficit present.     Mental Status: He is alert and  oriented to person, place, and time.  Psychiatric:        Attention and Perception: He is attentive. He does not perceive visual hallucinations.        Mood and Affect: Mood and affect normal.        Speech: Speech normal.        Behavior: Behavior is agitated.        Thought Content: Thought content normal.     Comments: Denies SI, HI.  No command hallucinations. Cooperative Does not appear to be responding to internal stimuli    ED Results / Procedures / Treatments   Labs (all labs ordered are listed, but only abnormal results are displayed) Labs Reviewed - No data to display  EKG None  Radiology No results found.  Procedures Procedures    Medications Ordered in ED Medications  fluPHENAZine decanoate (PROLIXIN) injection 12.5 mg (12.5 mg Intramuscular Given 05/11/22 2305)    ED Course/ Medical Decision Making/ A&P    46 year old here for evaluation of requesting his psychiatric medications.  Was supposed to get Prolixin yesterday however left without receiving medication.  I did discuss with the nurse who had patient yesterday.  States patient did not get his dose as patient left prior to receiving this.  Patient does not appear actively psychotic.  Does state he has chronic intermittent hallucinations where these are noncommand hallucinations.  He denies any SI, HI.  Patient calm, cooperative, follows commands.  Does have follow-up with [facility] in [facility] on Monday whom he follows with regularly.  Do not feel patient needs IVC criteria currently.  We will give him his dose of Prolixin.  Note from yesterday.  Provider talked with pharmacy about his dosing. Encouraged follow-up here behavioral health he develops worsening hallucinations, SI, HI.  Patient reassessed.  Cooperative.  Given medication here.  Again patient states he is at his baseline, he does not appear actively psychotic.  I encouraged return for new or worsening symptoms, worsening hallucinations,  thoughts of self harm. He is agreeable.  The patient has been appropriately medically screened and/or stabilized in the ED. I have low suspicion for any other emergent medical condition which would require further screening, evaluation or treatment in the ED or require inpatient management.  Patient is hemodynamically stable and in no acute distress.  Patient able to ambulate in department prior to ED.  Evaluation does not show acute pathology that would require ongoing or additional emergent interventions while in the emergency department or further inpatient treatment.  I have discussed the diagnosis with the patient and answered all questions.  Pain is been managed while in the emergency department and patient has no further complaints prior to discharge.  Patient is comfortable with plan discussed in room and is stable for discharge at this time.  I have discussed strict return precautions  for returning to the emergency department.  Patient was encouraged to follow-up with PCP/specialist refer to at discharge.                            Medical Decision Making Amount and/or Complexity of Data Reviewed External Data Reviewed: labs and notes.  Risk OTC drugs. Prescription drug management. Parenteral controlled substances. Diagnosis or treatment significantly limited by social determinants of health.         Final Clinical Impression(s) / ED Diagnoses Final diagnoses:  Paranoid schizophrenia (HCC)  Medication refill    Rx / DC Orders ED Discharge Orders     None         Joann Jorge A, PA-C 05/11/22 2311    Virgina Norfolk, DO 05/12/22 0038

## 2022-05-11 NOTE — ED Notes (Signed)
Pt seen walking towards another pts room, swearing and having a conversation with himself. Pt redirected back to his bed by this RN. Pt updated on plan of care and agreed to sit and wait for medicine.

## 2022-05-11 NOTE — ED Provider Triage Note (Signed)
Emergency Medicine Provider Triage Evaluation Note  Cole [facility] , a 46 y.o. male  was evaluated in triage.  Pt complains of needing shot of fluphenazine.  Was given part of a dose yesterday but not the complete dose.  Denies SI or HI, states he is hearing voices which are worsening compared to yesterday.  Review of Systems  Per HPI  Physical Exam  BP 136/86   Pulse (!) 110   Temp 98.9 F (37.2 C) (Oral)   Resp 16   SpO2 97%  Gen:   Awake, no distress   Resp:  Normal effort  MSK:   Moves extremities without difficulty  Other:    Medical Decision Making  Medically screening exam initiated at 2:28 PM.  Appropriate orders placed.  Cole Henderson was informed that the remainder of the evaluation will be completed by another provider, this initial triage assessment does not replace that evaluation, and the importance of remaining in the ED until their evaluation is complete.     Cole Raring, PA-C 05/11/22 1429

## 2022-05-12 ENCOUNTER — Ambulatory Visit (HOSPITAL_COMMUNITY)
Admission: EM | Admit: 2022-05-12 | Discharge: 2022-05-14 | Disposition: A | Discharge status: Home / Self Care | Payer: No Payment, Other | Attending: Urology | Admitting: Urology

## 2022-05-12 ENCOUNTER — Ambulatory Visit (HOSPITAL_COMMUNITY)
Admission: EM | Admit: 2022-05-12 | Discharge: 2022-05-12 | Disposition: A | Discharge status: Home / Self Care | Payer: No Payment, Other | Attending: Registered Nurse | Admitting: Registered Nurse

## 2022-05-12 ENCOUNTER — Encounter (HOSPITAL_COMMUNITY): Payer: Self-pay | Admitting: Registered Nurse

## 2022-05-12 DIAGNOSIS — F17210 Nicotine dependence, cigarettes, uncomplicated: Secondary | ICD-10-CM | POA: Insufficient documentation

## 2022-05-12 DIAGNOSIS — F209 Schizophrenia, unspecified: Secondary | ICD-10-CM | POA: Insufficient documentation

## 2022-05-12 DIAGNOSIS — R45850 Homicidal ideations: Secondary | ICD-10-CM | POA: Insufficient documentation

## 2022-05-12 DIAGNOSIS — Z79899 Other long term (current) drug therapy: Secondary | ICD-10-CM | POA: Insufficient documentation

## 2022-05-12 DIAGNOSIS — Z20822 Contact with and (suspected) exposure to covid-19: Secondary | ICD-10-CM | POA: Insufficient documentation

## 2022-05-12 DIAGNOSIS — F200 Paranoid schizophrenia: Secondary | ICD-10-CM | POA: Insufficient documentation

## 2022-05-12 MED ORDER — ARIPIPRAZOLE 5 MG PO TABS: 5.0000 mg | ORAL_TABLET | Freq: Every day | ORAL | Status: DC

## 2022-05-12 MED ORDER — ARIPIPRAZOLE 5 MG PO TABS
5.0000 mg | ORAL_TABLET | Freq: Once | ORAL | Status: AC
  Administered 2022-05-12: 5 mg via ORAL
  Filled 2022-05-12: qty 1

## 2022-05-12 MED ORDER — TRIHEXYPHENIDYL HCL 5 MG PO TABS
5.0000 mg | ORAL_TABLET | Freq: Two times a day (BID) | ORAL | 0 refills | Status: DC
  Filled 2022-06-07: qty 60, 30d supply, fill #0

## 2022-05-12 MED ORDER — ARIPIPRAZOLE ER 400 MG IM PRSY
400.0000 mg | PREFILLED_SYRINGE | INTRAMUSCULAR | Status: DC
  Administered 2022-05-12: 400 mg via INTRAMUSCULAR

## 2022-05-12 MED ORDER — ARIPIPRAZOLE ER 400 MG IM SRER: 400.0000 mg | INTRAMUSCULAR | 0 refills | Status: DC

## 2022-05-12 MED ORDER — ARIPIPRAZOLE ER 400 MG IM PRSY
400.0000 mg | PREFILLED_SYRINGE | INTRAMUSCULAR | Status: DC
Start: 2022-05-14 — End: 2022-05-12

## 2022-05-12 MED ORDER — HYDROXYZINE HCL 25 MG PO TABS: 25.0000 mg | ORAL_TABLET | Freq: Three times a day (TID) | ORAL | Status: DC | PRN

## 2022-05-12 MED ORDER — ALUM & MAG HYDROXIDE-SIMETH 200-200-20 MG/5ML PO SUSP: 30.0000 mL | ORAL | Status: DC | PRN

## 2022-05-12 MED ORDER — ARIPIPRAZOLE 5 MG PO TABS: 5.0000 mg | ORAL_TABLET | Freq: Every day | ORAL | 0 refills | Status: DC

## 2022-05-12 MED ORDER — MAGNESIUM HYDROXIDE 400 MG/5ML PO SUSP: 30.0000 mL | Freq: Every day | ORAL | Status: DC | PRN

## 2022-05-12 MED ORDER — ARIPIPRAZOLE ER 400 MG IM SRER
400.0000 mg | INTRAMUSCULAR | 0 refills | Status: DC
  Filled 2022-06-07: qty 1, 28d supply, fill #0

## 2022-05-12 MED ORDER — ACETAMINOPHEN 325 MG PO TABS: 650.0000 mg | ORAL_TABLET | Freq: Four times a day (QID) | ORAL | Status: DC | PRN

## 2022-05-12 NOTE — BH Assessment (Signed)
Attempted to call Ronik Macgillivray, pt's daughter, at [phone]. No answer. Left a HIPAA complaint message for a callback.

## 2022-05-12 NOTE — ED Provider Notes (Signed)
Behavioral Health Urgent Care Medical Screening Exam  Patient Name: Cole Henderson MRN: [id number] Date of Evaluation: 05/12/22 Chief Complaint:   Diagnosis:  Final diagnoses:  Schizophrenia, unspecified type ([place])    History of Present illness: Cole [facility] is a 46 y.o. male patient presented to [facility] as a walk in reporting he was dropped off by his daughter with complaints that his daughter had made comments that he was a child molester.    Joeseph Amor, 59 y.o., male patient seen face to face by this provider, consulted with Dr. Ernie Hew; and chart reviewed on 05/12/22.  On evaluation Cole Henderson reports he is really not sure why he was brought here other than his daughter saying that he had hurt a child.  Patient states "I don't know why they would say something like that I would never hurt a child.  I just got out of jail on Monday after doing one year for probation violation and I ain't trying to go back."  Patient reports that he done 15 years in federal prison for bank robbery and was let out on probation and recently getting out after a year for violating that probation.  States he was living with his daughter but has plans to go to the [employer] to stay which is closer to his job.  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  He states he has outpatient psychiatric services with [facility] and receives the Prolixin LAI and was given his last dose yesterday.  He also states he takes a pill (Artane) but unsure of the dosage.  He denies suicidal/self-harm/homicidal ideation, psychosis, and paranoia.  He has been witnessed speaking to someone that is not there and when asked who he was talking to he states his mother and sister in the room next door.  He was informed that his mother and sister wasn't in the room next door and then he starts to give information about his mother having trouble with swelling in one of her legs which makes it difficulty  for her to hold the baby.   During evaluation Cole Henderson is sitting up right in chair with noted distress.  He is alert/oriented x 4; calm  and cooperative but some what irritable about being held in urgent care.  Hies mood is euthymic with someone anxiousness and irritability about being here.  He speaking in a clear tone at moderate volume, and normal pace but some what pressured with irritability.  He has good eye contact.  His thought process is coherent and relevant but there is some indication that he may be having some delusional thinking and paranoia with the comments about his daughter and the children.  He has given permission to speak to one of his daughters.   He continues to deny suicidal/self-harm/homicidal ideation, psychosis, and paranoia.    Collateral Information:  Spoke to patients daughter Cole Henderson who informs she is the one that dropped him off states he lives with another sister in [place] and has been having problems getting to [employer] for outpatient provider at [facility] and has missed his injection.  States he was brought in because they (family) wanted him to get his injection and set up with services at [facility].  States she can pick him up but wanted him to get treatment before things got bad.  States that family noticed a change and didn't want it to get to the point where he has to  be hospitalized.    Patient agreed to switch to [place].  Will give Abilify PO to test for tolerability and if not will give Abilify Maintena 400 mg.  An appointment has been set for patient to follow up with [facility]     Psychiatric Specialty Exam  Presentation  General Appearance:Appropriate for Environment  Eye Contact:Good  Speech:Pressured  Speech Volume:Normal  Handedness:Left   Mood and Affect  Mood:Irritable; Euthymic  Affect:Congruent   Thought Process  Thought Processes:Coherent; Disorganized  Descriptions of  Associations:Circumstantial  Orientation:Full (Time, Place and Person)  Thought Content:Rumination; WDL  Diagnosis of Schizophrenia or Schizoaffective disorder in past: No data recorded Duration of Psychotic Symptoms: No data recorded Hallucinations:None (Patient denies but has been seen talking to someone not there)  Ideas of Reference:None (Patient denies any paranoia)  Suicidal Thoughts:No  Homicidal Thoughts:No   Sensorium  Memory:Immediate Fair; Recent Fair  Judgment:Intact  Insight:Present (Plans to go to the [employer] that is close to his job)   Community education officer  Concentration:No data recorded Attention Span:Fair  [place] of Knowledge:Good  Language:Good   Psychomotor Activity  Psychomotor Activity:Normal   Assets  Assets:Communication Skills; Desire for Improvement; Physical Health   Sleep  Sleep:Good  Number of hours: No data recorded  Nutritional Assessment (For OBS and FBC admissions only) Has the patient had a weight loss or gain of 10 pounds or more in the last 3 months?: No Has the patient had a decrease in food intake/or appetite?: No Does the patient have dental problems?: No Does the patient have eating habits or behaviors that may be indicators of an eating disorder including binging or inducing vomiting?: No Has the patient recently lost weight without trying?: 0 Has the patient been eating poorly because of a decreased appetite?: 0 Malnutrition Screening Tool Score: 0    Physical Exam: Physical Exam Vitals and nursing note reviewed. Exam conducted with a chaperone present.  Constitutional:      General: He is not in acute distress.    Appearance: Normal appearance. He is not ill-appearing.  HENT:     Head: Normocephalic.  Cardiovascular:     Rate and Rhythm: Normal rate.  Pulmonary:     Effort: Pulmonary effort is normal.  Musculoskeletal:        General: Normal range of motion.     Cervical back:  Normal range of motion.  Skin:    General: Skin is warm and dry.  Neurological:     Mental Status: He is alert and oriented to person, place, and time.  Psychiatric:        Attention and Perception: Attention normal.        Mood and Affect: Mood is anxious.        Speech: Speech is rapid and pressured.        Behavior: Behavior normal. Behavior is cooperative.    Review of Systems  Constitutional: Negative.   HENT: Negative.    Eyes: Negative.   Respiratory: Negative.    Cardiovascular: Negative.   Gastrointestinal: Negative.   Genitourinary: Negative.   Musculoskeletal: Negative.   Skin: Negative.   Neurological: Negative.   Endo/Heme/Allergies: Negative.   Psychiatric/Behavioral:  Depression: Denies. Hallucinations: Denies. Substance abuse: Denies. Suicidal ideas: Denies. Nervous/anxious: Denies. Insomnia: Denies.    There were no vitals taken for this visit. There is no height or weight on file to calculate BMI.  Musculoskeletal: Strength & Muscle Tone: within normal limits Gait & Station: normal Patient leans:  N/A   [place] MSE Discharge Disposition for Follow up and Recommendations: Based on my evaluation the patient does not appear to have an emergency medical condition and can be discharged with resources and follow up care in outpatient services for Medication Management and [place]. Go on 06/13/2022.   Specialty: Urgent Care Why: Keep scheduled appointment 06/13/2022 at 10:00 am for medication intake and next Abilify Maintena injection Contact information: [place] [phone]                 Discharge Instructions      Keep your scheduled appointment for 06/13/2022 for medication management intake and your next injection of Abilify Maintena.  If you feel you need to be seen sooner see below.       Walk in hours for medication  management Monday, Wednesday, Thursday, and Friday from 8:00 AM to 11:00 AM Recommend arriving by 7:30 AM.  It is first come first serve.    Walk in hours for therapy intake Monday and Wednesday only 8:00 AM to 11:00 AM Encouraged to arrive by 7:30 AM.  It is first come first serve   [facility]:  Is open on Tuesday and Thursday but call prior to coming if you don't have an appointment or if you need to be seen sooner than your next scheduled appointment.         Hailey Stormer, NP 05/12/2022, 11:21 AM

## 2022-05-12 NOTE — BH Assessment (Addendum)
Contacted daughter Steffen Dohn at [phone] with pt's permission. She stated that pt resides with another sister, Cloyde Reams. Angle stated that pt gets a monthly shot and is overdue. Per NP Shuvon pt can be given his IM today here and then, pt can register as a pt upstairs in the OP office for future IMs. Glenard Haring was agreeable to assist pt with that. Angle stated she would come to pick pt up.

## 2022-05-12 NOTE — Discharge Instructions (Addendum)
Keep your scheduled appointment for 06/13/2022 for medication management intake and your next injection of Abilify Maintena.  If you feel you need to be seen sooner see below.       Walk in hours for medication management Monday, Wednesday, Thursday, and Friday from 8:00 AM to 11:00 AM Recommend arriving by 7:30 AM.  It is first come first serve.    Walk in hours for therapy intake Monday and Wednesday only 8:00 AM to 11:00 AM Encouraged to arrive by 7:30 AM.  It is first come first serve   Sh[facility] Clinic:  Is open on Tuesday and Thursday but call prior to coming if you don't have an appointment or if you need to be seen sooner than your next scheduled appointment.

## 2022-05-12 NOTE — BH Assessment (Signed)
This clinician asked pt for permission to contact Raymondo Band who is a contact on his contact list. He said no and that they were not longer in contact.

## 2022-05-12 NOTE — Progress Notes (Signed)
Received Berry in the assessment room and given Abilify 5 mg PO without adverse side effects earlier. Later he received Abilify 400 mg in his right deltoid without incident. He waited out front with his sister for 15 min. He was given  his AVS, questions answered. His personal items were returned prior to his discharge.

## 2022-05-12 NOTE — Progress Notes (Addendum)
NOTE: It appears pt was not just released after 15 yrs of prison as he has Encounters at [facility] as recent as June, 2023 and a [facility] on 02/15/21 and other times during that time. Pt clarified for the NP that he was incarcerated recently for a year until this past Monday for a probation violation. Also, hx of methamphetamine use and substance-induced psychosis.   05/12/22 0813  [facility] Triage Screening (Walk-ins at [facility] only)  How Did You Hear About [place]? Family/Friend  What Is the Reason for Your Visit/Call Today? Cole Henderson is Urgent or Emergent.  Pt is  a 46 yo male who stated his daughter dropped him off here this morning and made comments that he was a child molester which made him angry. Pt presented voluntarily and unaccompanied. Hx of Schizophrenia per pt. Pt stated he was released from prison 17 days ago after serving 15 years for bank robbery. Pt stated I could call his daughter, Cole Henderson at [phone] for more information and stated that he has been staying with her since he was released. Pt denied SI, HI, NSSH, AVH and any substance use. Pt is disorganized and seems angry at times but can be calm and answer questions appropriately. It is unclear as to whether pt is responding to internal stimuli or not. Pt stated he is receiving some kind of IM from [facility]. Pt stated he is taking another medication in pill form but it was not clear what medication that is. Pt could not answer as to whether he is compliant with his medications or not. Pt seemed fixated on denying that he was a child molester.  How Long Has This Been Causing You Problems? > than 6 months  Have You Recently Had Any Thoughts About Hurting Yourself? No  Are You Planning to Commit Suicide/Harm Yourself At This time? No  Have you Recently Had Thoughts About Hurting Someone Cole Henderson? No  Are You Planning To Harm Someone At This Time? No  Are you currently experiencing any auditory, visual or other hallucinations? No (It is  unclear as to whether pt is responding to internal stimuli or not. He appeared at times to be hearing something and responding angrily.)  Have You Used Any Alcohol or Drugs in the Past 24 Hours? No (denies)  Do you have any current medical co-morbidities that require immediate attention? No  Clinician description of patient physical appearance/behavior: Pt seemed labile as far as mood and his flat and somewhat angry affect was congruent. Pt was calm but seemed on the edge of anger continually during the assessment. Pt was alert and may have been responding to internal stimuli at times although he denied AVH. Pt's speech was pressured and very fast. He seemed as if mumbling at times. Pt's thought content seemed fixated on denying that he was a child molester and linked to his anger. Pt's movement was within normal limits.  What Do You Feel Would Help You the Most Today? Medication(s);Treatment for Depression or other mood problem  Determination of Need Urgent (48 hours)   [name] T. Jimmye Norman, MS, [facility], [facility] Triage Specialist [facility]

## 2022-05-13 LAB — HEMOGLOBIN A1C
Hgb A1c MFr Bld: 5.6 % (ref 4.8–5.6)
Mean Plasma Glucose: 114.02 mg/dL

## 2022-05-13 LAB — POCT URINE DRUG SCREEN - MANUAL ENTRY (I-SCREEN)
POC Amphetamine UR: NOT DETECTED
POC Buprenorphine (BUP): NOT DETECTED
POC Cocaine UR: NOT DETECTED
POC Marijuana UR: NOT DETECTED
POC Methadone UR: NOT DETECTED
POC Methamphetamine UR: NOT DETECTED
POC Morphine: NOT DETECTED
POC Oxazepam (BZO): NOT DETECTED
POC Oxycodone UR: NOT DETECTED
POC Secobarbital (BAR): NOT DETECTED

## 2022-05-13 LAB — CBC WITH DIFFERENTIAL/PLATELET
Abs Immature Granulocytes: 0.03 10*3/uL (ref 0.00–0.07)
Basophils Absolute: 0.1 10*3/uL (ref 0.0–0.1)
Basophils Relative: 1 %
Eosinophils Absolute: 0.3 10*3/uL (ref 0.0–0.5)
Eosinophils Relative: 3 %
HCT: 33.9 % — ABNORMAL LOW (ref 39.0–52.0)
Hemoglobin: 11.1 g/dL — ABNORMAL LOW (ref 13.0–17.0)
Immature Granulocytes: 0 %
Lymphocytes Relative: 18 %
Lymphs Abs: 1.5 10*3/uL (ref 0.7–4.0)
MCH: 29.7 pg (ref 26.0–34.0)
MCHC: 32.7 g/dL (ref 30.0–36.0)
MCV: 90.6 fL (ref 80.0–100.0)
Monocytes Absolute: 0.9 10*3/uL (ref 0.1–1.0)
Monocytes Relative: 10 %
Neutro Abs: 5.9 10*3/uL (ref 1.7–7.7)
Neutrophils Relative %: 68 %
Platelets: 221 10*3/uL (ref 150–400)
RBC: 3.74 MIL/uL — ABNORMAL LOW (ref 4.22–5.81)
RDW: 13.8 % (ref 11.5–15.5)
WBC: 8.7 10*3/uL (ref 4.0–10.5)
nRBC: 0 % (ref 0.0–0.2)

## 2022-05-13 LAB — COMPREHENSIVE METABOLIC PANEL
ALT: 29 U/L (ref 0–44)
AST: 27 U/L (ref 15–41)
Albumin: 3.8 g/dL (ref 3.5–5.0)
Alkaline Phosphatase: 43 U/L (ref 38–126)
Anion gap: 11 (ref 5–15)
BUN: 11 mg/dL (ref 6–20)
CO2: 25 mmol/L (ref 22–32)
Calcium: 9.1 mg/dL (ref 8.9–10.3)
Chloride: 104 mmol/L (ref 98–111)
Creatinine, Ser: 0.76 mg/dL (ref 0.61–1.24)
GFR, Estimated: >60 mL/min (ref >60)
Glucose, Bld: 104 mg/dL — ABNORMAL HIGH (ref 70–99)
Potassium: 3.6 mmol/L (ref 3.5–5.1)
Sodium: 140 mmol/L (ref 135–145)
Total Bilirubin: 0.5 mg/dL (ref 0.3–1.2)
Total Protein: 6.3 g/dL — ABNORMAL LOW (ref 6.5–8.1)

## 2022-05-13 LAB — LIPID PANEL
Cholesterol: 151 mg/dL (ref 0–200)
HDL: 82 mg/dL (ref >40)
LDL Cholesterol: 53 mg/dL (ref 0–99)
Total CHOL/HDL Ratio: 1.8 RATIO
Triglycerides: 78 mg/dL (ref <150)
VLDL: 16 mg/dL (ref 0–40)

## 2022-05-13 LAB — RESP PANEL BY RT-PCR (FLU A&B, COVID) ARPGX2
Influenza A by PCR: NEGATIVE
Influenza B by PCR: NEGATIVE
SARS Coronavirus 2 by RT PCR: NEGATIVE

## 2022-05-13 LAB — POC SARS CORONAVIRUS 2 AG: SARSCOV2ONAVIRUS 2 AG: NEGATIVE

## 2022-05-13 LAB — TSH: TSH: 2.842 u[IU]/mL (ref 0.350–4.500)

## 2022-05-13 LAB — ETHANOL: Alcohol, Ethyl (B): <10 mg/dL (ref <10)

## 2022-05-13 MED ORDER — ARIPIPRAZOLE 5 MG PO TABS
5.0000 mg | ORAL_TABLET | Freq: Every day | ORAL | Status: DC
  Administered 2022-05-13: 5 mg via ORAL
  Filled 2022-05-13: qty 1

## 2022-05-13 MED ORDER — ARIPIPRAZOLE 5 MG PO TABS: 5.0000 mg | ORAL_TABLET | Freq: Every day | ORAL | Status: DC

## 2022-05-13 MED ORDER — LORAZEPAM 1 MG PO TABS
1.0000 mg | ORAL_TABLET | Freq: Once | ORAL | Status: AC | PRN
  Administered 2022-05-13: 1 mg via ORAL
  Filled 2022-05-13: qty 1

## 2022-05-13 MED ORDER — ARIPIPRAZOLE 15 MG PO TABS: 15.0000 mg | ORAL_TABLET | Freq: Every day | ORAL | Status: DC

## 2022-05-13 MED ORDER — LORAZEPAM 1 MG PO TABS: 1.0000 mg | ORAL_TABLET | Freq: Four times a day (QID) | ORAL | Status: DC | PRN

## 2022-05-13 MED ORDER — LISINOPRIL 20 MG PO TABS
20.0000 mg | ORAL_TABLET | Freq: Every day | ORAL | Status: DC
  Administered 2022-05-13 – 2022-05-14 (×2): 20 mg via ORAL
  Filled 2022-05-13 (×2): qty 1

## 2022-05-13 MED ORDER — TRIHEXYPHENIDYL HCL 5 MG PO TABS
5.0000 mg | ORAL_TABLET | Freq: Two times a day (BID) | ORAL | Status: DC
  Administered 2022-05-13 – 2022-05-14 (×3): 5 mg via ORAL
  Filled 2022-05-13 (×3): qty 1

## 2022-05-13 NOTE — Progress Notes (Signed)
Inpatient Behavioral Health Placement  Pt meets inpatient criteria per [facility], NP.  Referral was sent to the following facilities;    Destination [facility]  [address] [place] [address] [phone] --  [facility]  [address] [place] [address] [phone] --  [facility]  [address] [place] [address] [phone] --  [facility]  [address] [place] [address] [phone] --  [facility]  [phone] [place] [address] [phone] --  [facility]  [address] [place] [address] [phone] --  [facility]  [address] [place] [address] [phone] --  [facility]  [address] [place] [address] [phone] --  [facility]  [address] [place] [address] [phone] --  [facility]  [address], [place] [address] [phone] --  [facility]  [address], [place] [address] [phone] --  [facility]  [address] [place] [address] [phone] --  [facility]  [address] [place] [address] [phone] --  [facility]  [address], [place] [address] [phone] --  [facility]  [address] [place], [place] [address] [phone] --  [facility]  [address] [place] [address] [phone]    Situation ongoing,  CSW will follow up.   Maryjean Ka, MSW, LCSWA 05/13/2022  @ 9:56 AM

## 2022-05-13 NOTE — ED Notes (Signed)
Pt is restless he is constantly getting up and fussing about getting medications and going to hospital. Pt is redirectable. Will continue to monitor for safety

## 2022-05-13 NOTE — ED Notes (Signed)
Breakfast was given 

## 2022-05-13 NOTE — ED Provider Notes (Signed)
Behavioral Health Progress Note  Date and Time: 05/13/2022 1:34 PM Name: Cole Henderson MRN:  [id number]  Subjective:  Cole Henderson is a 46 year old male with psychiatric history of Paranoid schizophrenia. Patient presented voluntarily and unaccompanied to [facility] for a walk-in assessment. Per chart review, patient was assessed here at [facility] on 05/12/22 due to family's concerns with patient's declining mental health and due to patient not receiving LAI through [facility]. Patient was oral Aripiprazole 5 mg (test dose) and a shot of Aripiprazole 400 mg IM and scheduled for future outpatient medication management at [facility]. He was d/c home with family.   Patient seen and evaluated face-to-face by this provider, and chart reviewed. On evaluation, patient is alert and oriented to self. He is disoriented to time and place. He is unable to recall why he was brought to the behavioral health urgent care for an evaluation. He endorses auditory hallucinations telling him to "get out and get away." He endorses paranoia and states that he feels afraid. He states that he feels like he has lost touch with reality. He denies SI/HI. He reports fair sleep, 8 hours. He reports a fair appetite and states that he is hungry. He states that he feels tired from the injection that he received yesterday and feels like he needs more sleep.    Diagnosis:  Final diagnoses:  Paranoid schizophrenia ([place])    Total Time spent with patient: 20 minutes  Past Psychiatric History: history of paranoid schizophrenia.  Past Medical History:  Past Medical History:  Diagnosis Date   Drug abuse ([place])    Hypertension    Schizophrenia ([place])    Tardive dyskinesia    No past surgical history on file. Family History: No family history on file. Family Psychiatric  History: No history reported.  Social History:  Social History   Substance and Sexual Activity  Alcohol Use Yes     Social History   Substance and Sexual Activity  Drug Use  Yes   Types: Methamphetamines    Social History   Socioeconomic History   Marital status: Single    Spouse name: Not on file   Number of children: Not on file   Years of education: Not on file   Highest education level: Not on file  Occupational History   Not on file  Tobacco Use   Smoking status: Every Day    Packs/day: 0.50    Types: Cigarettes   Smokeless tobacco: Never  Substance and Sexual Activity   Alcohol use: Yes   Drug use: Yes    Types: Methamphetamines   Sexual activity: Not on file  Other Topics Concern   Not on file  Social History Narrative   Not on file   Social Determinants of Health   Financial Resource Strain: Not on file  Food Insecurity: Not on file  Transportation Needs: Not on file  Physical Activity: Not on file  Stress: Not on file  Social Connections: Not on file   SDOH:  SDOH Screenings   Alcohol Screen: Not on file  Depression (PHQ2-9): Medium Risk (12/01/2020)   Depression (PHQ2-9)    PHQ-2 Score: 19  Financial Resource Strain: Not on file  Food Insecurity: Not on file  Housing: Not on file  Physical Activity: Not on file  Social Connections: Not on file  Stress: Not on file  Tobacco Use: High Risk (05/12/2022)   Patient History    Smoking Tobacco Use: Every Day    Smokeless Tobacco Use: Never  Passive Exposure: Not on file  Transportation Needs: Not on file   Additional Social History:    Pain Medications: See Epic note from NP [place] Rankin for 05/12/22 Prescriptions: See Epic note from NP [place] Rankin for 05/12/22 Over the Counter: See Epic note from NP [place] Rankin for 05/12/22 History of alcohol / drug use?: Yes Name of Substance 1: Pt does not divulge.       Current Medications:  Current Facility-Administered Medications  Medication Dose Route Frequency Provider Last Rate Last Admin   acetaminophen (TYLENOL) tablet 650 mg  650 mg Oral Q6H PRN Ajibola, Ene A, NP       alum & mag hydroxide-simeth  (MAALOX/MYLANTA) 200-200-20 MG/5ML suspension 30 mL  30 mL Oral Q4H PRN Ajibola, Ene A, NP       [START ON 05/14/2022] ARIPiprazole (ABILIFY) tablet 15 mg  15 mg Oral QHS Arli Bree L, NP       hydrOXYzine (ATARAX) tablet 25 mg  25 mg Oral TID PRN Ajibola, Ene A, NP       lisinopril (ZESTRIL) tablet 20 mg  20 mg Oral Daily Ajibola, Ene A, NP   20 mg at 05/13/22 0901   LORazepam (ATIVAN) tablet 1 mg  1 mg Oral Once PRN Ajibola, Ene A, NP       magnesium hydroxide (MILK OF MAGNESIA) suspension 30 mL  30 mL Oral Daily PRN Ajibola, Ene A, NP       trihexyphenidyl (ARTANE) tablet 5 mg  5 mg Oral BID WC Ajibola, Ene A, NP   5 mg at 05/13/22 0901   Current Outpatient Medications  Medication Sig Dispense Refill   ARIPiprazole (ABILIFY) 5 MG tablet Take 1 tablet (5 mg total) by mouth daily. For 14 days 14 tablet 0   ARIPiprazole ER (ABILIFY MAINTENA) 400 MG SRER injection Inject 2 mLs (400 mg total) into the muscle every 28 (twenty-eight) days. Please bring medication with you to your scheduled appointment at [facility] on 06/13/2022 at 10:00 am for injection 1 each 0   lisinopril (ZESTRIL) 20 MG tablet Take 1 tablet (20 mg total) by mouth daily. 30 tablet 0   trihexyphenidyl (ARTANE) 5 MG tablet Take 1 tablet (5 mg total) by mouth 2 (two) times daily with a meal. 60 tablet 0    Labs  Lab Results:  Admission on 05/12/2022  Component Date Value Ref Range Status   SARS Coronavirus 2 by RT PCR 05/13/2022 NEGATIVE  NEGATIVE Final   Comment: (NOTE) SARS-CoV-2 target nucleic acids are NOT DETECTED.  The SARS-CoV-2 RNA is generally detectable in upper respiratory specimens during the acute phase of infection. The lowest concentration of SARS-CoV-2 viral copies this assay can detect is 138 copies/mL. A negative result does not preclude SARS-Cov-2 infection and should not be used as the sole basis for treatment or other patient management decisions. A negative result may  occur with  improper specimen collection/handling, submission of specimen other than nasopharyngeal swab, presence of viral mutation(s) within the areas targeted by this assay, and inadequate number of viral copies(<138 copies/mL). A negative result must be combined with clinical observations, patient history, and epidemiological information. The expected result is Negative.  Fact Sheet for Patients:  EntrepreneurPulse.com.au  Fact Sheet for Healthcare Providers:  IncredibleEmployment.be  This test is no                          t yet approved or cleared by  the [employer] and  has been authorized for detection and/or diagnosis of SARS-CoV-2 by FDA under an Emergency Use Authorization (EUA). This EUA will remain  in effect (meaning this test can be used) for the duration of the COVID-19 declaration under Section 564(b)(1) of the Act, 21 U.S.C.section 360bbb-3(b)(1), unless the authorization is terminated  or revoked sooner.       Influenza A by PCR 05/13/2022 NEGATIVE  NEGATIVE Final   Influenza B by PCR 05/13/2022 NEGATIVE  NEGATIVE Final   Comment: (NOTE) The Xpert Xpress SARS-CoV-2/FLU/RSV plus assay is intended as an aid in the diagnosis of influenza from Nasopharyngeal swab specimens and should not be used as a sole basis for treatment. Nasal washings and aspirates are unacceptable for Xpert Xpress SARS-CoV-2/FLU/RSV testing.  Fact Sheet for Patients: EntrepreneurPulse.com.au  Fact Sheet for Healthcare Providers: IncredibleEmployment.be  This test is not yet approved or cleared by the [place] FDA and has been authorized for detection and/or diagnosis of SARS-CoV-2 by FDA under an Emergency Use Authorization (EUA). This EUA will remain in effect (meaning this test can be used) for the duration of the COVID-19 declaration under Section 564(b)(1) of the Act, 21 U.S.C. section  360bbb-3(b)(1), unless the authorization is terminated or revoked.  Performed at [facility], [place] [address] [place], [place] [address]    WBC 05/13/2022 8.7  4.0 - 10.5 K/uL Final   RBC 05/13/2022 3.74 (L)  4.22 - 5.81 MIL/uL Final   Hemoglobin 05/13/2022 11.1 (L)  13.0 - 17.0 g/dL Final   HCT 05/13/2022 33.9 (L)  39.0 - 52.0 % Final   MCV 05/13/2022 90.6  80.0 - 100.0 fL Final   MCH 05/13/2022 29.7  26.0 - 34.0 pg Final   MCHC 05/13/2022 32.7  30.0 - 36.0 g/dL Final   RDW 05/13/2022 13.8  11.5 - 15.5 % Final   Platelets 05/13/2022 221  150 - 400 K/uL Final   nRBC 05/13/2022 0.0  0.0 - 0.2 % Final   Neutrophils Relative % 05/13/2022 68  % Final   Neutro Abs 05/13/2022 5.9  1.7 - 7.7 K/uL Final   Lymphocytes Relative 05/13/2022 18  % Final   Lymphs Abs 05/13/2022 1.5  0.7 - 4.0 K/uL Final   Monocytes Relative 05/13/2022 10  % Final   Monocytes Absolute 05/13/2022 0.9  0.1 - 1.0 K/uL Final   Eosinophils Relative 05/13/2022 3  % Final   Eosinophils Absolute 05/13/2022 0.3  0.0 - 0.5 K/uL Final   Basophils Relative 05/13/2022 1  % Final   Basophils Absolute 05/13/2022 0.1  0.0 - 0.1 K/uL Final   Immature Granulocytes 05/13/2022 0  % Final   Abs Immature Granulocytes 05/13/2022 0.03  0.00 - 0.07 K/uL Final   Performed at [facility], [place] [address] [place], [place] [address]   Sodium 05/13/2022 140  135 - 145 mmol/L Final   Potassium 05/13/2022 3.6  3.5 - 5.1 mmol/L Final   Chloride 05/13/2022 104  98 - 111 mmol/L Final   CO2 05/13/2022 25  22 - 32 mmol/L Final   Glucose, Bld 05/13/2022 104 (H)  70 - 99 mg/dL Final   Glucose reference range applies only to samples taken after fasting for at least 8 hours.   BUN 05/13/2022 11  6 - 20 mg/dL Final   Creatinine, Ser 05/13/2022 0.76  0.61 - 1.24 mg/dL Final   Calcium 05/13/2022 9.1  8.9 - 10.3 mg/dL Final   Total Protein 05/13/2022 6.3 (  L)  6.5 - 8.1 g/dL Final   Albumin 05/13/2022 3.8  3.5 - 5.0 g/dL Final   AST  05/13/2022 27  15 - 41 U/L Final   ALT 05/13/2022 29  0 - 44 U/L Final   Alkaline Phosphatase 05/13/2022 43  38 - 126 U/L Final   Total Bilirubin 05/13/2022 0.5  0.3 - 1.2 mg/dL Final   GFR, Estimated 05/13/2022 >60  >60 mL/min Final   Comment: (NOTE) Calculated using the CKD-EPI Creatinine Equation (2021)    Anion gap 05/13/2022 11  5 - 15 Final   Performed at [facility], [place] [address], [place], [place] [address]   Hgb A1c MFr Bld 05/13/2022 5.6  4.8 - 5.6 % Final   Comment: (NOTE) Pre diabetes:          5.7%-6.4%  Diabetes:              >6.4%  Glycemic control for   <7.0% adults with diabetes    Mean Plasma Glucose 05/13/2022 114.02  mg/dL Final   Performed at [facility], [place] [address], [place], [place] [address]   Alcohol, Ethyl (B) 05/13/2022 <10  <10 mg/dL Final   Comment: (NOTE) Lowest detectable limit for serum alcohol is 10 mg/dL.  For medical purposes only. Performed at [facility], [place] [address], [place], [place] [address]    Cholesterol 05/13/2022 151  0 - 200 mg/dL Final   Triglycerides 05/13/2022 78  <150 mg/dL Final   HDL 05/13/2022 82  >40 mg/dL Final   Total CHOL/HDL Ratio 05/13/2022 1.8  RATIO Final   VLDL 05/13/2022 16  0 - 40 mg/dL Final   LDL Cholesterol 05/13/2022 53  0 - 99 mg/dL Final   Comment:        Total Cholesterol/HDL:CHD Risk Coronary Heart Disease Risk Table                     Men   Women  1/2 Average Risk   3.4   3.3  Average Risk       5.0   4.4  2 X Average Risk   9.6   7.1  3 X Average Risk  23.4   11.0        Use the calculated Patient Ratio above and the CHD Risk Table to determine the patient's CHD Risk.        ATP III CLASSIFICATION (LDL):  <100     mg/dL   Optimal  100-129  mg/dL   Near or Above                    Optimal  130-159  mg/dL   Borderline  160-189  mg/dL   High  >190     mg/dL   Very High Performed at [place] [address], [place], [place] [address]    TSH 05/13/2022  2.842  0.350 - 4.500 uIU/mL Final   Comment: Performed by a 3rd Generation assay with a functional sensitivity of <=0.01 uIU/mL. Performed at [facility], [place] [address], [place], [place] [address]    POC Amphetamine UR 05/13/2022 None Detected  NONE DETECTED (Cut Off Level 1000 ng/mL) Preliminary   POC Secobarbital (BAR) 05/13/2022 None Detected  NONE DETECTED (Cut Off Level 300 ng/mL) Preliminary   POC Buprenorphine (BUP) 05/13/2022 None Detected  NONE DETECTED (Cut Off Level 10 ng/mL) Preliminary   POC Oxazepam (BZO) 05/13/2022 None Detected  NONE DETECTED (Cut Off Level 300 ng/mL) Preliminary   POC Cocaine UR 05/13/2022 None Detected  NONE DETECTED (Cut Off Level 300 ng/mL) Preliminary   POC Methamphetamine UR 05/13/2022 None Detected  NONE DETECTED (Cut Off Level 1000 ng/mL) Preliminary   POC Morphine 05/13/2022 None Detected  NONE DETECTED (Cut Off Level 300 ng/mL) Preliminary   POC Methadone UR 05/13/2022 None Detected  NONE DETECTED (Cut Off Level 300 ng/mL) Preliminary   POC Oxycodone UR 05/13/2022 None Detected  NONE DETECTED (Cut Off Level 100 ng/mL) Preliminary   POC Marijuana UR 05/13/2022 None Detected  NONE DETECTED (Cut Off Level 50 ng/mL) Preliminary   SARSCOV2ONAVIRUS 2 AG 05/13/2022 NEGATIVE  NEGATIVE Final   Comment: (NOTE) SARS-CoV-2 antigen NOT DETECTED.   Negative results are presumptive.  Negative results do not preclude SARS-CoV-2 infection and should not be used as the sole basis for treatment or other patient management decisions, including infection  control decisions, particularly in the presence of clinical signs and  symptoms consistent with COVID-19, or in those who have been in contact with the virus.  Negative results must be combined with clinical observations, patient history, and epidemiological information. The expected result is Negative.  Fact Sheet for Patients: HandmadeRecipes.com.cy  Fact Sheet for Healthcare  Providers: FuneralLife.at  This test is not yet approved or cleared by the [place] FDA and  has been authorized for detection and/or diagnosis of SARS-CoV-2 by FDA under an Emergency Use Authorization (EUA).  This EUA will remain in effect (meaning this test can be used) for the duration of  the COV                          ID-19 declaration under Section 564(b)(1) of the Act, 21 U.S.C. section 360bbb-3(b)(1), unless the authorization is terminated or revoked sooner.      Blood Alcohol level:  Lab Results  Component Value Date   ETH <10 05/13/2022   ETH <10 123456    Metabolic Disorder Labs: Lab Results  Component Value Date   HGBA1C 5.6 05/13/2022   MPG 114.02 05/13/2022   No results found for: "PROLACTIN" Lab Results  Component Value Date   CHOL 151 05/13/2022   TRIG 78 05/13/2022   HDL 82 05/13/2022   CHOLHDL 1.8 05/13/2022   VLDL 16 05/13/2022   LDLCALC 53 05/13/2022    Therapeutic Lab Levels: No results found for: "LITHIUM" No results found for: "VALPROATE" Lab Results  Component Value Date   CBMZ 5.2 05/10/2009    Physical Findings   PHQ2-9    Flowsheet Row ED from 11/30/2020 in [facility] from [facility]/2020 in [facility]  PHQ-2 Total Score 2 0  PHQ-9 Total Score 19 9      [facility] from 05/12/2022 in [facility] from 05/11/2022 in [facility] from 05/10/2022 in [place] [facility] No Risk No Risk No Risk        Musculoskeletal  Strength & Muscle Tone: within normal limits Gait & Station: normal Patient leans: N/A  Psychiatric Specialty Exam  Presentation  General Appearance: Appropriate for Environment  Eye Contact:Minimal  Speech:Garbled  Speech Volume:Decreased  Handedness:Right   Mood and Affect   Mood:Dysphoric  Affect:Congruent   Thought Process  Thought Processes:Disorganized  Descriptions of Associations:Circumstantial  Orientation:Partial  Thought Content:Paranoid Ideation  Diagnosis of Schizophrenia  or Schizoaffective disorder in past: Yes  Duration of Psychotic Symptoms: Greater than six months   Hallucinations:Hallucinations: Auditory Description of Auditory Hallucinations: patient says "of course i hear voices, i'm schizophrenic"  Ideas of Reference:Paranoia  Suicidal Thoughts:Suicidal Thoughts: No  Homicidal Thoughts:Homicidal Thoughts: No HI Active Intent and/or Plan: With Plan   Sensorium  Memory:Immediate Poor; Recent Poor; Remote Poor  Judgment:Poor  Insight:Poor   Executive Functions  Concentration:Poor  Attention Span:Poor  Recall:Poor  Fund of Knowledge:Poor  Language:Poor   Psychomotor Activity  Psychomotor Activity:Psychomotor Activity: Restlessness   Assets  Assets:Communication Skills; Desire for Improvement; Social Support   Sleep  Sleep:Sleep: Fair Number of Hours of Sleep: 8   Nutritional Assessment (For OBS and FBC admissions only) Has the patient had a weight loss or gain of 10 pounds or more in the last 3 months?: No Has the patient had a decrease in food intake/or appetite?: No Does the patient have dental problems?: No Does the patient have eating habits or behaviors that may be indicators of an eating disorder including binging or inducing vomiting?: No Has the patient recently lost weight without trying?: 0 Has the patient been eating poorly because of a decreased appetite?: 0 Malnutrition Screening Tool Score: 0    Physical Exam  Physical Exam HENT:     Head: Normocephalic.  Cardiovascular:     Rate and Rhythm: Normal rate.     Comments: Hypertensive.  Pulmonary:     Effort: Pulmonary effort is normal.  Neurological:     Mental Status: He is alert. He is disoriented.    Review of Systems   Constitutional: Negative.   HENT: Negative.    Eyes: Negative.   Respiratory: Negative.    Cardiovascular: Negative.   Gastrointestinal: Negative.   Genitourinary: Negative.   Musculoskeletal: Negative.   Skin: Negative.   Neurological: Negative.   Endo/Heme/Allergies: Negative.    Blood pressure (!) 140/93, pulse 85, temperature 98.3 F (36.8 C), temperature source Oral, resp. rate 20, SpO2 97 %. There is no height or weight on file to calculate BMI.  Treatment Plan Summary: Patient is recommended for inpatient psychiatric treatment due to psychosis AEB pt responding to internal and external stimuli, delusional and paranoia thought content and disorganization. No available beds at [facility]. Patient faxed out for inpatient psychiatric placement.   Schizophrenia-  Aripiprazole 400 mg IM on 05/12/22 at [facility] Increased Abilify from 5 mg po to 15 mg po QHS at the recommendation of Dr. Mamie Levers   HTN -continue lisinopril 20 mg po daily  Consider adding prn clonidine or Norvasc per Dr. Mamie Levers if b/p doesn't stabilize.     Marissa Calamity, NP 05/13/2022 1:34 PM
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
## 2022-05-13 NOTE — ED Notes (Signed)
Pt was given a muffin and juice for breakfast.  

## 2022-05-13 NOTE — ED Notes (Signed)
Pt is in the bed sleeping. Respirations are even and unlabored. No acute distress noted. Will continue to monitor for safety. 
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
## 2022-05-13 NOTE — ED Notes (Addendum)
PT has been given his morning medications. Pt is lying in bed quietly no distress noted. Pt states he is still a little sleepy from a shot he was given this nurse advised the patient that it was fine for him to go back to sleep and we will wake him when we or the providers need to speak with him

## 2022-05-13 NOTE — ED Notes (Signed)
Pt refused lunch

## 2022-05-13 NOTE — ED Notes (Signed)
Pt was laying in bed quiet when a pt told him he smell. Pt jumped up throw his blankets called pt "bitch" and said he was about to "fuck her up". I proceeded to ask him to calm down and I will get her to leave him alone. Staff called security due to him still being loud and cursing and stating he wanted to be d/c. Pt was moved to flex area and he is now resting calmly. Will continue to monitor pt for safety and frustration

## 2022-05-13 NOTE — Progress Notes (Signed)
Received Cole Henderson this AM asleep in his chair bed, he was awaken for VS check and received his medication. He ate breakfast and  refused lunch. This afternoon he woke up on his own and made several calls. He was restless and expressed a desire to leave. He eventually drifted back off to sleep.

## 2022-05-13 NOTE — ED Notes (Addendum)
Pt admitted to continuous assessment due to AVH, delusions, and HI towards 3 people he states made accusations of him being a child molester. Pt A&O x3, calm and cooperative. Denies current SI. Pt tolerated lab work and skin assessment well. Pt ambulated independently to unit. Oriented to unit/staff. Meal and juice provided. No signs of acute distress noted. Will continue to monitor for safety.

## 2022-05-13 NOTE — ED Notes (Signed)
Pt has been given his afternoon medications as well as provided dinner. Pt is eating spaghetti, a rice krispy treat and drinking cranberry juice

## 2022-05-14 ENCOUNTER — Encounter (HOSPITAL_COMMUNITY): Payer: Self-pay | Admitting: Emergency Medicine

## 2022-05-14 MED ORDER — ARIPIPRAZOLE 15 MG PO TABS
15.0000 mg | ORAL_TABLET | Freq: Every day | ORAL | 0 refills | Status: DC
Start: 2022-05-14 — End: 2022-08-13

## 2022-05-14 NOTE — Discharge Instructions (Addendum)
Discharge recommendations:  Patient is to take medications as prescribed. Take Abilify 15 mg po daily at bedtime for 2 weeks. Next Abilify injection is due on 06/13/22. Please see information for follow-up appointment with psychiatry and therapy. Please follow up with your primary care provider for all medical related needs.   Medications: The patient to contact a medical professional and/or outpatient provider to address any new side effects that develop. The patient should update outpatient providers of any new medications and/or medication changes.   Atypical antipsychotics: If you are prescribed an atypical antipsychotic, it is recommended that your height, weight, BMI, blood pressure, fasting lipid panel, and fasting blood sugar be monitored by your outpatient providers.  Safety:  The patient should abstain from use of illicit substances/drugs and abuse of any medications. If symptoms worsen or do not continue to improve or if the patient becomes actively suicidal or homicidal then it is recommended that the patient return to the closest hospital emergency department, the [facility], or call 911 for further evaluation and treatment. National Suicide Prevention Lifeline 1-800-SUICIDE or (765)874-4185.  About 988 988 offers 24/7 access to trained crisis counselors who can help people experiencing mental health-related distress. People can call or text 988 or chat 988lifeline.org for themselves or if they are worried about a loved one who may need crisis support.

## 2022-05-14 NOTE — ED Notes (Signed)
Pt was given a beef, veggies, and juice for lunch.

## 2022-05-14 NOTE — ED Notes (Signed)
Pt is in the bed sleeping. Respirations are even and unlabored. No acute distress noted. Will continue to monitor for safety. 

## 2022-05-14 NOTE — ED Notes (Signed)
Patient discharged with an after visit summary that included appointment for June 16, 2022 for Abilify injection appointment. Patient received a paper prescription for Abilify tablets, medication education given. Patient voiced understanding. Patient left in private vehicle with daughter.  Patient denies SI, HI and AVH at time of discharge, pain 0/10.

## 2022-05-14 NOTE — Progress Notes (Signed)
Patient is alert and oriented X 3, denies SI, HI and AVH. Patient pleasant and calm, compliant with medications, able to preform all ADLs. Patient talked with his daughter Cole Henderson this morning  who states patient can come home as long as he is stable on medications according to patient. Nursing staff will continue to monitor patient.

## 2022-05-14 NOTE — ED Provider Notes (Addendum)
FBC/OBS ASAP Discharge Summary  Date and Time: 05/14/2022 10:53 AM  Name: Cole Henderson  MRN:  [id number]   Discharge Diagnoses:  Final diagnoses:  Paranoid schizophrenia (HCC)    Subjective: "I feel a lot better today, can I go home." Patient states that he feels safe going home. He sates that he lives with his daughter, mother and grandchild.   Stay Summary: Urban Naval is a 46 year old male with psychiatric history of Paranoid schizophrenia. Patient presented voluntarily and unaccompanied to [facility] for a walk-in assessment. Per chart review, patient was assessed here at [facility] on 05/12/22 due to family's concerns with patient's declining mental health and due to patient not receiving LAI through [facility]. Patient was oral Aripiprazole 5 mg (test dose) and a shot of Aripiprazole 400 mg IM and scheduled for future outpatient medication management at [facility]. He was d/c home with family. He returned on 05/13/21 at around 2 am for a re-evaluation.    On 05/13/22, patient was re-evaluated and noted to be disoriented to time and place. He was unable to recall why he was brought to the behavioral health urgent care for an evaluation. He endorsed auditory hallucinations telling him to "get out and get away." He endorsed paranoia. He felt like he had lost touch with reality. Patient's Abilify was increased from 5 mg po daily to 15 mg po daily at bedtime to target symptoms of psychosis.   Today on 05/14/22, patient has shown gradual improvement and no longer reports AVH or paranoia. There is no objective evidence that the patient is responding to internal or external stimuli. He is alert and oriented to person, place, time and situation. He verbalizes that he has apologized to his daughter for scaring her because he was walking around thinking someone was out to get him. He denies SI/HI. He denies depressive symptoms. He reports good sleep. He reports a fair appetite. He is compliant with taking his scheduled  medications and denies any side effects to starting the Abilify. He was advised to continue taking the Abilify 15 mg po at bedtime for the next two weeks and then stop. He was advised that his next Abilify injection is due on July 11th here at the [facility] outpatient. Patient verbalizes understanding. Patient has been observed on the unit for 24 hours without any disruptive, aggressive, self harm or psychoic behaviors. Patient has stabilized on medication changes made. Safety planning completed with the patient's daughter [name]. Patient will discharge at this time with recommendations to follow up with outpatient services for medication management.    Total Time spent with patient: 30 minutes  Past Psychiatric History: History of paranoid schizophrenia  Past Medical History:  Past Medical History:  Diagnosis Date   Drug abuse (HCC)    Hypertension    Schizophrenia (HCC)    Tardive dyskinesia    History reviewed. No pertinent surgical history. Family History: History reviewed. No pertinent family history. Family Psychiatric History:  Social History:  Social History   Substance and Sexual Activity  Alcohol Use Yes     Social History   Substance and Sexual Activity  Drug Use Yes   Types: Methamphetamines    Social History   Socioeconomic History   Marital status: Single    Spouse name: Not on file   Number of children: Not on file   Years of education: Not on file   Highest education level: Not on file  Occupational History   Not on file  Tobacco Use   Smoking status:  Every Day    Packs/day: 0.50    Types: Cigarettes   Smokeless tobacco: Never  Substance and Sexual Activity   Alcohol use: Yes   Drug use: Yes    Types: Methamphetamines   Sexual activity: Not on file  Other Topics Concern   Not on file  Social History Narrative   Not on file   Social Determinants of Health   Financial Resource Strain: Not on file  Food Insecurity: Not on file  Transportation Needs:  Not on file  Physical Activity: Not on file  Stress: Not on file  Social Connections: Not on file   SDOH:  SDOH Screenings   Alcohol Screen: Not on file  Depression (PHQ2-9): Medium Risk (12/01/2020)   Depression (PHQ2-9)    PHQ-2 Score: 19  Financial Resource Strain: Not on file  Food Insecurity: Not on file  Housing: Not on file  Physical Activity: Not on file  Social Connections: Not on file  Stress: Not on file  Tobacco Use: High Risk (05/14/2022)   Patient History    Smoking Tobacco Use: Every Day    Smokeless Tobacco Use: Never    Passive Exposure: Not on file  Transportation Needs: Not on file    Tobacco Cessation:  Prescription not provided because: declined   Current Medications:  Current Facility-Administered Medications  Medication Dose Route Frequency Provider Last Rate Last Admin   acetaminophen (TYLENOL) tablet 650 mg  650 mg Oral Q6H PRN Ajibola, Ene A, NP       alum & mag hydroxide-simeth (MAALOX/MYLANTA) 200-200-20 MG/5ML suspension 30 mL  30 mL Oral Q4H PRN Ajibola, Ene A, NP       ARIPiprazole (ABILIFY) tablet 15 mg  15 mg Oral QHS Luverne Farone L, NP       hydrOXYzine (ATARAX) tablet 25 mg  25 mg Oral TID PRN Ajibola, Ene A, NP       lisinopril (ZESTRIL) tablet 20 mg  20 mg Oral Daily Ajibola, Ene A, NP   20 mg at 05/14/22 0837   LORazepam (ATIVAN) tablet 1 mg  1 mg Oral Q6H PRN Kayal Mula L, NP       magnesium hydroxide (MILK OF MAGNESIA) suspension 30 mL  30 mL Oral Daily PRN Ajibola, Ene A, NP       trihexyphenidyl (ARTANE) tablet 5 mg  5 mg Oral BID WC Ajibola, Ene A, NP   5 mg at 05/14/22 7591   Current Outpatient Medications  Medication Sig Dispense Refill   ARIPiprazole (ABILIFY) 15 MG tablet Take 1 tablet (15 mg total) by mouth at bedtime. 14 tablet 0   ARIPiprazole ER (ABILIFY MAINTENA) 400 MG SRER injection Inject 2 mLs (400 mg total) into the muscle every 28 (twenty-eight) days. Please bring medication with you to your scheduled  appointment at [facility] on 06/13/2022 at 10:00 am for injection 1 each 0   lisinopril (ZESTRIL) 20 MG tablet Take 1 tablet (20 mg total) by mouth daily. 30 tablet 0   trihexyphenidyl (ARTANE) 5 MG tablet Take 1 tablet (5 mg total) by mouth 2 (two) times daily with a meal. 60 tablet 0    PTA Medications: (Not in a hospital admission)      12/01/2020    9:13 AM 08/28/2020    7:59 PM  Depression screen PHQ 2/9  Decreased Interest 1 0  Down, Depressed, Hopeless 1 0  PHQ - 2 Score 2 0  Altered sleeping 3 3  Tired, decreased energy  3 0  Change in appetite 3 3  Feeling bad or failure about yourself  3 0  Trouble concentrating 3 1  Moving slowly or fidgety/restless 2 2  Suicidal thoughts 0 0  PHQ-9 Score 19 9  Difficult doing work/chores Very difficult Somewhat difficult    Flowsheet Row ED from 05/12/2022 in [facility] ED from 05/11/2022 in [facility] EMERGENCY DEPARTMENT ED from 05/10/2022 in [facility] EMERGENCY DEPARTMENT  C-SSRS RISK CATEGORY No Risk No Risk No Risk       Musculoskeletal  Strength & Muscle Tone: within normal limits Gait & Station: normal Patient leans: N/A  Psychiatric Specialty Exam  Presentation  General Appearance: Appropriate for Environment  Eye Contact:Fair  Speech:Clear and Coherent  Speech Volume:Normal  Handedness:Right   Mood and Affect  Mood:Euthymic  Affect:Congruent   Thought Process  Thought Processes:Coherent  Descriptions of Associations:Intact  Orientation:Full (Time, Place and Person)  Thought Content:Logical  Diagnosis of Schizophrenia or Schizoaffective disorder in past: Yes  Duration of Psychotic Symptoms: Greater than six months   Hallucinations:Hallucinations: None Description of Auditory Hallucinations: patient says "of course i hear voices, i'm schizophrenic"  Ideas of Reference:None  Suicidal Thoughts:Suicidal  Thoughts: No  Homicidal Thoughts:Homicidal Thoughts: No HI Active Intent and/or Plan: With Plan   Sensorium  Memory:Immediate Fair; Recent Fair; Remote Fair  Judgment:Fair  Insight:Fair   Executive Functions  Concentration:Fair  Attention Span:Fair  Recall:Fair  Fund of Knowledge:Fair  Language:Fair   Psychomotor Activity  Psychomotor Activity:Psychomotor Activity: Normal   Assets  Assets:Communication Skills; Desire for Improvement; Financial Resources/Insurance; Housing; Leisure Time; Physical Health; Resilience; Social Support   Sleep  Sleep:Sleep: Fair Number of Hours of Sleep: 8   Nutritional Assessment (For OBS and FBC admissions only) Has the patient had a weight loss or gain of 10 pounds or more in the last 3 months?: No Has the patient had a decrease in food intake/or appetite?: No Does the patient have dental problems?: No Does the patient have eating habits or behaviors that may be indicators of an eating disorder including binging or inducing vomiting?: No Has the patient recently lost weight without trying?: 0 Has the patient been eating poorly because of a decreased appetite?: 0 Malnutrition Screening Tool Score: 0    Physical Exam  Physical Exam HENT:     Head: Normocephalic.     Nose: Nose normal.  Eyes:     Conjunctiva/sclera: Conjunctivae normal.  Cardiovascular:     Rate and Rhythm: Normal rate.  Musculoskeletal:        General: Normal range of motion.     Cervical back: Normal range of motion.  Neurological:     Mental Status: He is alert and oriented to person, place, and time.    Review of Systems  Constitutional: Negative.   HENT: Negative.    Eyes: Negative.   Respiratory: Negative.    Cardiovascular: Negative.   Gastrointestinal: Negative.   Genitourinary: Negative.   Musculoskeletal: Negative.   Skin: Negative.   Neurological: Negative.   Endo/Heme/Allergies: Negative.    Blood pressure 127/82, pulse 92,  temperature 98.3 F (36.8 C), temperature source Oral, resp. rate 18, SpO2 99 %. There is no height or weight on file to calculate BMI.  Demographic Factors:  Male and Caucasian  Loss Factors: NA  Historical Factors: NA  Risk Reduction Factors:   Sense of responsibility to family, Religious beliefs about death, Employed, Living with another person, especially a relative, and  Positive social support  Continued Clinical Symptoms:  Previous Psychiatric Diagnoses and Treatments  Cognitive Features That Contribute To Risk:  None    Suicide Risk:  Minimal: No identifiable suicidal ideation.  Patients presenting with no risk factors but with morbid ruminations; may be classified as minimal risk based on the severity of the depressive symptoms  Plan Of Care/Follow-up recommendations:  Activity:  as tolerated  Discharge recommendations:  Patient is to take medications as prescribed. Take Abilify 15 mg po daily at bedtime for 2 weeks. Prescription for Abilify 15 mg po x 14 days provided at discharge. Next Abilify injection is due on 06/13/22. Please see information for follow-up appointment with psychiatry and therapy. Please follow up with your primary care provider for all medical related needs.   Medications: The patient to contact a medical professional and/or outpatient provider to address any new side effects that develop. The patient should update outpatient providers of any new medications and/or medication changes.   Atypical antipsychotics: If you are prescribed an atypical antipsychotic, it is recommended that your height, weight, BMI, blood pressure, fasting lipid panel, and fasting blood sugar be monitored by your outpatient providers.  Safety:  The patient should abstain from use of illicit substances/drugs and abuse of any medications. If symptoms worsen or do not continue to improve or if the patient becomes actively suicidal or homicidal then it is recommended that the  patient return to the closest hospital emergency department[facility], or call 911 for further evaluation and treatment. National Suicide Prevention Lifeline 1-800-SUICIDE or 443-030-60921-719-833-7104.  About 988 988 offers 24/7 access to trained crisis counselors who can help people experiencing mental health-related distress. People can call or text 988 or chat 988lifeline.org for themselves or if they are worried about a loved one who may need crisis support.   Disposition: discharge to self/home. Safety planning completed with the patient's daughter [name] [phone].  Layla BarterWhite, Mckenleigh Tarlton L, NP 05/14/2022, 10:53 AM

## 2022-05-22 ENCOUNTER — Other Ambulatory Visit: Payer: Self-pay

## 2022-05-22 MED ORDER — ARIPIPRAZOLE 15 MG PO TABS
ORAL_TABLET | ORAL | 0 refills | Status: DC
  Filled 2022-05-22 (×2): qty 14, 14d supply, fill #0

## 2022-05-23 ENCOUNTER — Other Ambulatory Visit (HOSPITAL_COMMUNITY): Payer: Self-pay | Admitting: Psychiatry

## 2022-05-23 MED ORDER — ABILIFY MAINTENA 400 MG IM SRER: 400.0000 mg | INTRAMUSCULAR | 11 refills | Status: DC

## 2022-06-05 ENCOUNTER — Telehealth (HOSPITAL_COMMUNITY): Payer: Self-pay | Admitting: General Practice

## 2022-06-05 NOTE — BH Assessment (Signed)
Care Management - [facility] Follow Up Discharges   Writer attempted to make contact with patient today and was unsuccessful.  No voicemail, phone just rang.     Per chart review patient will follow up at [facility] appointment for June 16, 2022 for Abilify injection appointment.

## 2022-06-07 ENCOUNTER — Other Ambulatory Visit: Payer: Self-pay

## 2022-06-07 MED ORDER — ARIPIPRAZOLE 5 MG PO TABS
ORAL_TABLET | ORAL | 0 refills | Status: DC
  Filled 2022-06-07: qty 14, 14d supply, fill #0

## 2022-06-08 ENCOUNTER — Other Ambulatory Visit: Payer: Self-pay

## 2022-06-13 ENCOUNTER — Ambulatory Visit (HOSPITAL_COMMUNITY): Payer: Federal, State, Local not specified - Other

## 2022-08-12 ENCOUNTER — Other Ambulatory Visit: Payer: Self-pay

## 2022-08-12 ENCOUNTER — Ambulatory Visit (HOSPITAL_COMMUNITY)
Admission: EM | Admit: 2022-08-12 | Discharge: 2022-08-13 | Disposition: A | Discharge status: Home / Self Care | Payer: No Payment, Other | Attending: Behavioral Health | Admitting: Behavioral Health

## 2022-08-12 DIAGNOSIS — F200 Paranoid schizophrenia: Secondary | ICD-10-CM | POA: Insufficient documentation

## 2022-08-12 DIAGNOSIS — F1510 Other stimulant abuse, uncomplicated: Secondary | ICD-10-CM | POA: Insufficient documentation

## 2022-08-12 DIAGNOSIS — Z79899 Other long term (current) drug therapy: Secondary | ICD-10-CM | POA: Insufficient documentation

## 2022-08-12 DIAGNOSIS — Z20822 Contact with and (suspected) exposure to covid-19: Secondary | ICD-10-CM | POA: Insufficient documentation

## 2022-08-12 LAB — POCT URINE DRUG SCREEN - MANUAL ENTRY (I-SCREEN)
POC Amphetamine UR: POSITIVE — AB
POC Buprenorphine (BUP): NOT DETECTED
POC Cocaine UR: NOT DETECTED
POC Marijuana UR: NOT DETECTED
POC Methadone UR: NOT DETECTED
POC Methamphetamine UR: POSITIVE — AB
POC Morphine: NOT DETECTED
POC Oxazepam (BZO): NOT DETECTED
POC Oxycodone UR: NOT DETECTED
POC Secobarbital (BAR): NOT DETECTED

## 2022-08-12 LAB — CBC WITH DIFFERENTIAL/PLATELET
Abs Immature Granulocytes: 0.02 10*3/uL (ref 0.00–0.07)
Basophils Absolute: 0.1 10*3/uL (ref 0.0–0.1)
Basophils Relative: 1 %
Eosinophils Absolute: 0.3 10*3/uL (ref 0.0–0.5)
Eosinophils Relative: 3 %
HCT: 36.8 % — ABNORMAL LOW (ref 39.0–52.0)
Hemoglobin: 12 g/dL — ABNORMAL LOW (ref 13.0–17.0)
Immature Granulocytes: 0 %
Lymphocytes Relative: 21 %
Lymphs Abs: 1.7 10*3/uL (ref 0.7–4.0)
MCH: 28.4 pg (ref 26.0–34.0)
MCHC: 32.6 g/dL (ref 30.0–36.0)
MCV: 87.2 fL (ref 80.0–100.0)
Monocytes Absolute: 0.7 10*3/uL (ref 0.1–1.0)
Monocytes Relative: 8 %
Neutro Abs: 5.4 10*3/uL (ref 1.7–7.7)
Neutrophils Relative %: 67 %
Platelets: 249 10*3/uL (ref 150–400)
RBC: 4.22 MIL/uL (ref 4.22–5.81)
RDW: 13.4 % (ref 11.5–15.5)
WBC: 8.2 10*3/uL (ref 4.0–10.5)
nRBC: 0 % (ref 0.0–0.2)

## 2022-08-12 LAB — COMPREHENSIVE METABOLIC PANEL
ALT: 22 U/L (ref 0–44)
AST: 26 U/L (ref 15–41)
Albumin: 4.1 g/dL (ref 3.5–5.0)
Alkaline Phosphatase: 47 U/L (ref 38–126)
Anion gap: 5 (ref 5–15)
BUN: 18 mg/dL (ref 6–20)
CO2: 25 mmol/L (ref 22–32)
Calcium: 8.8 mg/dL — ABNORMAL LOW (ref 8.9–10.3)
Chloride: 109 mmol/L (ref 98–111)
Creatinine, Ser: 0.99 mg/dL (ref 0.61–1.24)
GFR, Estimated: >60 mL/min (ref >60)
Glucose, Bld: 107 mg/dL — ABNORMAL HIGH (ref 70–99)
Potassium: 4.5 mmol/L (ref 3.5–5.1)
Sodium: 139 mmol/L (ref 135–145)
Total Bilirubin: 0.9 mg/dL (ref 0.3–1.2)
Total Protein: 6.7 g/dL (ref 6.5–8.1)

## 2022-08-12 LAB — ETHANOL: Alcohol, Ethyl (B): <10 mg/dL (ref <10)

## 2022-08-12 LAB — POC SARS CORONAVIRUS 2 AG: SARSCOV2ONAVIRUS 2 AG: NEGATIVE

## 2022-08-12 LAB — RESP PANEL BY RT-PCR (FLU A&B, COVID) ARPGX2
Influenza A by PCR: NEGATIVE
Influenza B by PCR: NEGATIVE
SARS Coronavirus 2 by RT PCR: NEGATIVE

## 2022-08-12 MED ORDER — ALUM & MAG HYDROXIDE-SIMETH 200-200-20 MG/5ML PO SUSP: 30.0000 mL | ORAL | Status: DC | PRN

## 2022-08-12 MED ORDER — OLANZAPINE 2.5 MG PO TABS
2.5000 mg | ORAL_TABLET | Freq: Once | ORAL | Status: AC
  Administered 2022-08-12: 2.5 mg via ORAL
  Filled 2022-08-12: qty 1

## 2022-08-12 MED ORDER — HYDROXYZINE HCL 25 MG PO TABS
25.0000 mg | ORAL_TABLET | Freq: Three times a day (TID) | ORAL | Status: DC | PRN
  Administered 2022-08-12: 25 mg via ORAL
  Filled 2022-08-12: qty 1

## 2022-08-12 MED ORDER — OLANZAPINE 5 MG PO TABS
5.0000 mg | ORAL_TABLET | Freq: Every day | ORAL | Status: DC
  Administered 2022-08-12: 5 mg via ORAL
  Filled 2022-08-12: qty 1

## 2022-08-12 MED ORDER — MAGNESIUM HYDROXIDE 400 MG/5ML PO SUSP: 30.0000 mL | Freq: Every day | ORAL | Status: DC | PRN

## 2022-08-12 NOTE — ED Provider Notes (Signed)
[facility] H&P  Date: 08/12/22 Patient Name: Cole Henderson MRN: [id number] Chief Complaint:  Chief Complaint  Patient presents with   Hallucinations      Diagnoses:  Final diagnoses:  Paranoid schizophrenia [facility](HCC)    HPI: Cole Henderson is a 46 year old male patient with a past psychiatric history significant for paranoid schizophrenia who [facility] accompanied by law enforcement with a chief complaint of hallucinations.  Triage note: [facility] by [employer]. Per [employer] the pts daughter called 911 to transport the pt to this facility due to his worsening hallucinations. Pt states that he is due for his abilify shot and would like to receive his shot today and discharge home. Pt states [facility]. Pt reports hearing a voice talking about politics and republicans and he is tired of it. Pt denies SI/HI and visual hallucinations at the moment.  Patient seen and evaluated face-to-face by this provider, chart reviewed and [name]. On evaluation, patient is alert and oriented x 4. His thought process is logical and relevant. His speech is clear and coherent at a decreased tone. His mood is anxious and affect is congruent. He is calm and cooperative.  Patient states that he did not have a way to get his Abilify injection for the past 5 days. He states that he went to [facility] on Monday to get his injection but was told to come back next Monday. He states that he takes Abilify 100 mg IM monthly. He states that the voices keeps getting bad since Monday. He states that the voice is a Veterinary surgeondemocrat and he is a Psychologist, sport and exerciserepublican. He states that the voice in his head name is "Cole Pellegrinied and that Cole Southerned talks shit all the time and whispers." It is unclear as to whether or not the patient is experiencing visual hallucinations, however he vaguely reports seeing shadows of people. There is no objective  evidence that the patient is currently responding to internal or external stimuli at this time. He denies suicidal ideations. He denies homicidal ideations. He reports fair sleep, 4 to 5 hours per night. He reports a good appetite. He denies using illicit drugs. He reports drinking alcohol sometimes, a pint of liquor on average. He states that his last drink was 2 days ago. He reports a sobriety of 15 years from 2000 to 2020 while he was in prison. He states that he started back drinking alcohol when he was released from prison. He states that he is on probation at this time. He states that he was living with a friend but now he plans to stay with his daughter [name].   This provider present. Patient consents for TTS to obtain collateral from his daughter Cole Henderson who reports that patient is "hallucinating bad" and when he has episodes like this it usually lasts for a week before he gets treatment. Daughter reports that patient hallucinations usually involve patient thinking someone is trying to kill him or hurt his family. Daughter reports that she has not seen patient today, but she knows that patient [place]. [She states that she has not spoken to her father in one month.]  Patient daughter [name] [phone]) returned TTS call stating that she called the police today so that her father can come and get his injection because she did not have transportation to get him here. Daughter reports that patient was living with  his friend, and he decided to come to her house this morning. Daughter reports that patient went into this room and started yelling and cursing for no reason. Daughter reports that last night patient friend called the police because patient was hallucinating. Daughter denies that patient was a danger to himself or anyone else and he can return to her house when he is discharged.    PHQ 2-9:  Flowsheet Row ED from 11/30/2020 in [facility] EMERGENCY  DEPARTMENT ED from 08/28/2020 in [facility]  Thoughts that you would be better off dead, or of hurting yourself in some way Not at all Not at all  PHQ-9 Total Score 19 9       Flowsheet Row ED from 08/12/2022 in [facility] ED from 05/12/2022 in [facility] ED from 05/11/2022 in [facility] EMERGENCY DEPARTMENT  C-SSRS RISK CATEGORY No Risk No Risk No Risk        Total Time spent with patient: 30 minutes  Musculoskeletal  Strength & Muscle Tone: within normal limits Gait & Station: normal Patient leans: N/A  Psychiatric Specialty Exam  Presentation General Appearance: Appropriate for Environment  Eye Contact:Fair  Speech:Clear and Coherent  Speech Volume:Normal  Handedness:Right   Mood and Affect  Mood:Anxious  Affect:Congruent   Thought Process  Thought Processes:Coherent  Descriptions of Associations:Intact  Orientation:Full (Time, Place and Person)  Thought Content:Logical  Diagnosis of Schizophrenia or Schizoaffective disorder in past: Yes  Duration of Psychotic Symptoms: Greater than six months  Hallucinations:Hallucinations: Auditory  Ideas of Reference:None  Suicidal Thoughts:Suicidal Thoughts: No  Homicidal Thoughts:Homicidal Thoughts: No   Sensorium  Memory:Immediate Fair; Recent Fair; Remote Fair  Judgment:Poor  Insight:Fair   Executive Functions  Concentration:Fair  Attention Span:Fair  Recall:Fair  Fund of Knowledge:Fair  Language:Fair   Psychomotor Activity  Psychomotor Activity:Psychomotor Activity: Normal   Assets  Assets:Communication Skills; Desire for Improvement; Housing; Leisure Time; Social Support; Physical Health   Sleep  Sleep:Sleep: Fair Number of Hours of Sleep: 5   Nutritional Assessment (For OBS and FBC admissions only) Has the patient had a weight loss or gain of 10 pounds or more in the last 3  months?: No Has the patient had a decrease in food intake/or appetite?: No Does the patient have dental problems?: No Does the patient have eating habits or behaviors that may be indicators of an eating disorder including binging or inducing vomiting?: No Has the patient recently lost weight without trying?: 0 Has the patient been eating poorly because of a decreased appetite?: 0 Malnutrition Screening Tool Score: 0    Physical Exam HENT:     Head: Normocephalic.     Nose: Nose normal.  Eyes:     Conjunctiva/sclera: Conjunctivae normal.  Cardiovascular:     Rate and Rhythm: Normal rate.  Pulmonary:     Effort: Pulmonary effort is normal.  Musculoskeletal:        General: Normal range of motion.     Cervical back: Normal range of motion.  Neurological:     Mental Status: He is alert and oriented to person, place, and time.    Review of Systems  Constitutional: Negative.   HENT: Negative.    Eyes: Negative.   Respiratory: Negative.    Cardiovascular: Negative.   Gastrointestinal: Negative.   Genitourinary: Negative.   Musculoskeletal: Negative.   Neurological: Negative.   Endo/Heme/Allergies: Negative.     Blood pressure (!) 160/90. There is  no height or weight on file to calculate BMI.  Past Psychiatric History: History of paranoid schizophrenia.   Is the patient at risk to self? No  Has the patient been a risk to self in the past 6 months? No .    Has the patient been a risk to self within the distant past? No   Is the patient a risk to others? No   Has the patient been a risk to others in the past 6 months? No   Has the patient been a risk to others within the distant past? No   Past Medical History:  Past Medical History:  Diagnosis Date   Drug abuse (HCC)    Hypertension    Schizophrenia (HCC)    Tardive dyskinesia    No past surgical history on file.  Family History: No family history on file.  Social History:  Social History   Socioeconomic  History   Marital status: Single    Spouse name: Not on file   Number of children: Not on file   Years of education: Not on file   Highest education level: Not on file  Occupational History   Not on file  Tobacco Use   Smoking status: Every Day    Packs/day: 0.50    Types: Cigarettes   Smokeless tobacco: Never  Substance and Sexual Activity   Alcohol use: Yes   Drug use: Yes    Types: Methamphetamines   Sexual activity: Not on file  Other Topics Concern   Not on file  Social History Narrative   Not on file   Social Determinants of Health   Financial Resource Strain: Not on file  Food Insecurity: Not on file  Transportation Needs: Not on file  Physical Activity: Not on file  Stress: Not on file  Social Connections: Not on file  Intimate Partner Violence: Not on file    SDOH:  SDOH Screenings   Depression (PHQ2-9): Medium Risk (12/01/2020)  Tobacco Use: High Risk (05/14/2022)    Last Labs:  Admission on 05/12/2022, Discharged on 05/14/2022  Component Date Value Ref Range Status   SARS Coronavirus 2 by RT PCR 05/13/2022 NEGATIVE  NEGATIVE Final   Comment: (NOTE) SARS-CoV-2 target nucleic acids are NOT DETECTED.  The SARS-CoV-2 RNA is generally detectable in upper respiratory specimens during the acute phase of infection. The lowest concentration of SARS-CoV-2 viral copies this assay can detect is 138 copies/mL. A negative result does not preclude SARS-Cov-2 infection and should not be used as the sole basis for treatment or other patient management decisions. A negative result may occur with  improper specimen collection/handling, submission of specimen other than nasopharyngeal swab, presence of viral mutation(s) within the areas targeted by this assay, and inadequate number of viral copies(<138 copies/mL). A negative result must be combined with clinical observations, patient history, and epidemiological information. The expected result is Negative.  Fact  Sheet for Patients:  BloggerCourse.com  Fact Sheet for Healthcare Providers:  SeriousBroker.it  This test is no                          t yet approved or cleared by the [place] FDA and  has been authorized for detection and/or diagnosis of SARS-CoV-2 by FDA under an Emergency Use Authorization (EUA). This EUA will remain  in effect (meaning this test can be used) for the duration of the COVID-19 declaration under Section 564(b)(1) of the Act, 21 U.S.C.section 360bbb-3(b)(1), unless  the authorization is terminated  or revoked sooner.       Influenza A by PCR 05/13/2022 NEGATIVE  NEGATIVE Final   Influenza B by PCR 05/13/2022 NEGATIVE  NEGATIVE Final   Comment: (NOTE) The Xpert Xpress SARS-CoV-2/FLU/RSV plus assay is intended as an aid in the diagnosis of influenza from Nasopharyngeal swab specimens and should not be used as a sole basis for treatment. Nasal washings and aspirates are unacceptable for Xpert Xpress SARS-CoV-2/FLU/RSV testing.  Fact Sheet for Patients: BloggerCourse.com  Fact Sheet for Healthcare Providers: SeriousBroker.it  This test is not yet approved or cleared by the [place] FDA and has been authorized for detection and/or diagnosis of SARS-CoV-2 by FDA under an Emergency Use Authorization (EUA). This EUA will remain in effect (meaning this test can be used) for the duration of the COVID-19 declaration under Section 564(b)(1) of the Act, 21 U.S.C. section 360bbb-3(b)(1), unless the authorization is terminated or revoked.  Performed at [facility], [address] [place], [place] [address]    WBC 05/13/2022 8.7  4.0 - 10.5 K/uL Final   RBC 05/13/2022 3.74 (L)  4.22 - 5.81 MIL/uL Final   Hemoglobin 05/13/2022 11.1 (L)  13.0 - 17.0 g/dL Final   HCT 36/64/4034 33.9 (L)  39.0 - 52.0 % Final   MCV 05/13/2022 90.6  80.0 - 100.0 fL Final    MCH 05/13/2022 29.7  26.0 - 34.0 pg Final   MCHC 05/13/2022 32.7  30.0 - 36.0 g/dL Final   RDW 74/25/9563 13.8  11.5 - 15.5 % Final   Platelets 05/13/2022 221  150 - 400 K/uL Final   nRBC 05/13/2022 0.0  0.0 - 0.2 % Final   Neutrophils Relative % 05/13/2022 68  % Final   Neutro Abs 05/13/2022 5.9  1.7 - 7.7 K/uL Final   Lymphocytes Relative 05/13/2022 18  % Final   Lymphs Abs 05/13/2022 1.5  0.7 - 4.0 K/uL Final   Monocytes Relative 05/13/2022 10  % Final   Monocytes Absolute 05/13/2022 0.9  0.1 - 1.0 K/uL Final   Eosinophils Relative 05/13/2022 3  % Final   Eosinophils Absolute 05/13/2022 0.3  0.0 - 0.5 K/uL Final   Basophils Relative 05/13/2022 1  % Final   Basophils Absolute 05/13/2022 0.1  0.0 - 0.1 K/uL Final   Immature Granulocytes 05/13/2022 0  % Final   Abs Immature Granulocytes 05/13/2022 0.03  0.00 - 0.07 K/uL Final   Performed at [facility], [address], [place], [place] [address]   Sodium 05/13/2022 140  135 - 145 mmol/L Final   Potassium 05/13/2022 3.6  3.5 - 5.1 mmol/L Final   Chloride 05/13/2022 104  98 - 111 mmol/L Final   CO2 05/13/2022 25  22 - 32 mmol/L Final   Glucose, Bld 05/13/2022 104 (H)  70 - 99 mg/dL Final   Glucose reference range applies only to samples taken after fasting for at least 8 hours.   BUN 05/13/2022 11  6 - 20 mg/dL Final   Creatinine, Ser 05/13/2022 0.76  0.61 - 1.24 mg/dL Final   Calcium 33/29/5188 9.1  8.9 - 10.3 mg/dL Final   Total Protein 41/66/0630 6.3 (L)  6.5 - 8.1 g/dL Final   Albumin 16/12/930 3.8  3.5 - 5.0 g/dL Final   AST 35/57/3220 27  15 - 41 U/L Final   ALT 05/13/2022 29  0 - 44 U/L Final   Alkaline Phosphatase 05/13/2022 43  38 - 126 U/L Final  Total Bilirubin 05/13/2022 0.5  0.3 - 1.2 mg/dL Final   GFR, Estimated 05/13/2022 >60  >60 mL/min Final   Comment: (NOTE) Calculated using the CKD-EPI Creatinine Equation (2021)    Anion gap 05/13/2022 11  5 - 15 Final   Performed at [facility], [address], [place], [place] [address]   Hgb A1c MFr Bld 05/13/2022 5.6  4.8 - 5.6 % Final   Comment: (NOTE) Pre diabetes:          5.7%-6.4%  Diabetes:              >6.4%  Glycemic control for   <7.0% adults with diabetes    Mean Plasma Glucose 05/13/2022 114.02  mg/dL Final   Performed at [facility], [address] [place], [place] [address]   Alcohol, Ethyl (B) 05/13/2022 <10  <10 mg/dL Final   Comment: (NOTE) Lowest detectable limit for serum alcohol is 10 mg/dL.  For medical purposes only. Performed at [facility], [address], [place], [place] [address]    Cholesterol 05/13/2022 151  0 - 200 mg/dL Final   Triglycerides 93/26/7124 78  <150 mg/dL Final   HDL 58/08/9832 82  >40 mg/dL Final   Total CHOL/HDL Ratio 05/13/2022 1.8  RATIO Final   VLDL 05/13/2022 16  0 - 40 mg/dL Final   LDL Cholesterol 05/13/2022 53  0 - 99 mg/dL Final   Comment:        Total Cholesterol/HDL:CHD Risk Coronary Heart Disease Risk Table                     Men   Women  1/2 Average Risk   3.4   3.3  Average Risk       5.0   4.4  2 X Average Risk   9.6   7.1  3 X Average Risk  23.4   11.0        Use the calculated Patient Ratio above and the CHD Risk Table to determine the patient's CHD Risk.        ATP III CLASSIFICATION (LDL):  <100     mg/dL   Optimal  825-053  mg/dL   Near or Above                    Optimal  130-159  mg/dL   Borderline  976-734  mg/dL   High  >193     mg/dL   Very High Performed at [facility], [address], [place], [place] [address]    TSH 05/13/2022 2.842  0.350 - 4.500 uIU/mL Final   Comment: Performed by a 3rd Generation assay with a functional sensitivity of <=0.01 uIU/mL. Performed at [facility], [address] [place], [place] [address]    POC Amphetamine UR 05/13/2022 None Detected  NONE DETECTED (Cut Off Level 1000 ng/mL) Preliminary   POC Secobarbital (BAR) 05/13/2022 None Detected  NONE DETECTED (Cut Off Level 300 ng/mL)  Preliminary   POC Buprenorphine (BUP) 05/13/2022 None Detected  NONE DETECTED (Cut Off Level 10 ng/mL) Preliminary   POC Oxazepam (BZO) 05/13/2022 None Detected  NONE DETECTED (Cut Off Level 300 ng/mL) Preliminary   POC Cocaine UR 05/13/2022 None Detected  NONE DETECTED (Cut Off Level 300 ng/mL) Preliminary   POC Methamphetamine UR 05/13/2022 None Detected  NONE DETECTED (Cut Off Level 1000 ng/mL) Preliminary   POC Morphine 05/13/2022 None Detected  NONE DETECTED (Cut  Off Level 300 ng/mL) Preliminary   POC Methadone UR 05/13/2022 None Detected  NONE DETECTED (Cut Off Level 300 ng/mL) Preliminary   POC Oxycodone UR 05/13/2022 None Detected  NONE DETECTED (Cut Off Level 100 ng/mL) Preliminary   POC Marijuana UR 05/13/2022 None Detected  NONE DETECTED (Cut Off Level 50 ng/mL) Preliminary   SARSCOV2ONAVIRUS 2 AG 05/13/2022 NEGATIVE  NEGATIVE Final   Comment: (NOTE) SARS-CoV-2 antigen NOT DETECTED.   Negative results are presumptive.  Negative results do not preclude SARS-CoV-2 infection and should not be used as the sole basis for treatment or other patient management decisions, including infection  control decisions, particularly in the presence of clinical signs and  symptoms consistent with COVID-19, or in those who have been in contact with the virus.  Negative results must be combined with clinical observations, patient history, and epidemiological information. The expected result is Negative.  Fact Sheet for Patients: https://www.jennings-kim.com/  Fact Sheet for Healthcare Providers: https://alexander-rogers.biz/  This test is not yet approved or cleared by the [place] FDA and  has been authorized for detection and/or diagnosis of SARS-CoV-2 by FDA under an Emergency Use Authorization (EUA).  This EUA will remain in effect (meaning this test can be used) for the duration of  the COV                          ID-19 declaration under Section 564(b)(1) of  the Act, 21 U.S.C. section 360bbb-3(b)(1), unless the authorization is terminated or revoked sooner.      Allergies: Benztropine mesylate, Fish allergy, Haloperidol, Haloperidol lactate, Shellfish allergy, Cephalexin, Cogentin [benztropine], Risperidone, Risperidone and related, Strawberry extract, and Geodon [ziprasidone]  PTA Medications: (Not in a hospital admission)   Medical Decision Making  Patient admitted to the [facility] continuous assessment unit for overnight observation to monitor and treat hallucinations.   Lab Orders         Resp Panel by RT-PCR (Flu A&B, Covid) Anterior Nasal Swab         CBC with Differential/Platelet         Comprehensive metabolic panel         Ethanol         POCT Urine Drug Screen - (I-Screen)    EKG  Medications:  Initiate Zyprexa 5 mg po QHS for psychosis Initiate Zyprexa 2.5 mg po once for hallucinations and tolerability.     Recommendations  Based on my evaluation the patient does not appear to have an emergency medical condition.  Layla Barter, NP 08/12/22  1:53 PM

## 2022-08-12 NOTE — ED Notes (Signed)
[facility] called to collect STAT specimens and to deliver to [facility].  

## 2022-08-12 NOTE — ED Notes (Signed)
Pt presents to [facility] escorted by [employer]. Patient reports increase hallucinations.  Pt reports hearing a voice talking about politics and republicans and he is tired of it.Pt denies SI/HI and visual hallucinations at the moment. Patient denies any physical complaints when asked. No acute distress noted. Support and encouragement provided. Routine safety checks conducted according to facility protocol. Encouraged patient to notify staff if thoughts of harm toward self or others arise.  Will continue to monitor for safety.

## 2022-08-12 NOTE — BH Assessment (Addendum)
Comprehensive Clinical Assessment (CCA) Screening, Triage and Referral Note  08/12/2022 Cole Henderson [id number]  Disposition: Per Liborio Nixon, NP, patient is recommended for overnight observation.  Flowsheet Row ED from 08/12/2022 in [facility] ED from 05/12/2022 in [facility] ED from 05/11/2022 in [facility] EMERGENCY DEPARTMENT  C-SSRS RISK CATEGORY No Risk No Risk No Risk       The patient demonstrates the following risk factors for suicide: Chronic risk factors for suicide include: psychiatric disorder of paranoid schizophrenia . Acute risk factors for suicide include: N/A. Protective factors for this patient include: positive social support, positive therapeutic relationship, and responsibility to others (children, family). Considering these factors, the overall suicide risk at this point appears to be low. Patient is appropriate for outpatient follow up.  Chief Complaint:  Chief Complaint  Patient presents with   Hallucinations   Visit Diagnosis: Paranoid schizophrenia (HCC)  Patient Reported Information How did you hear about [place]? Legal System  What Is the Reason for Your Visit/Call Today? Pt presents to [facility] escorted by [employer]. Per [employer] the pts daughter called 911 to transport the pt to this facility due to his worsening hallucinations. Pt states that he is due for his abilify shot and would like to receive his shot today and discharge home. Pt states he usually receives his shot at [facility]. Pt reports hearing a voice talking about politics and republicans and he is tired of it.Pt denies SI/HI and visual hallucinations at the moment.  Patient reports that the voice in his head name is "Cole Henderson" who is a Veterinary surgeon and patient reports that he is a republic. Patient reports that "Cole Henderson talks shit all the time" and "sometimes he whispers so I cant hear what he is saying". Patient reports he attempted to get his injection at [facility]  on Monday, but they told him to come back this upcoming Monday. Patient reports an increase in the [facility] since Monday which prompted him to get his injection.  Patient has a documented diagnosis of schizophrenia and is receiving services at [facility] and most recently referred to [facility] for outpatient services. Patient reports his last hospitalization was in 2020, however, this will be patient fourth visit in a ED setting in the past three months.  Patient denies drug use and reports that he drunk a pint of liquor about two days ago. Patient has a history of incarceration and is currently on probation. Patient is employed in the roofing business and is currently living with a friend in [place]. Patient denies access to a firearm.   Patient is oriented x4, alert, engaged, and cooperative. Patient eye contact is normal, speech is garbled with articulation error, and his mood and affect are appropriate. Patient reports chronic AH and reports experiencing some VH which he coined as "run by" or "shadow people" due to only catching a glimpse of them out the corner of his eye. Patient denies SI, HI and SIB.   Patient consents for TTS to obtain collateral from his daughter Angle who reports that patient is "hallucinating bad" and when he has episodes like this it usually lasts for a week before he gets treatment. Daughter reports that patient hallucinations usually involve patient thinking someone is trying to kill him or hurt his family. Daughter reports that she has not seen patient today, but she knows that patient is living with a friend in [place].   Patient daughter Cole Henderson [phone]) returned TTS call stating that she  called the police today so that her father can come and get his injection because she did not have transportation to get him here. Daughter reports that patient was living with his friend, and he decided to come to her house this morning. Daughter reports that patient went into this room and  started yelling and cursing for no reason. Daughter reports that last night patient friend called the police because patient was hallucinating. Daughter denies that patient was a danger to himself or anyone else and he can return to her house when he is discharged.   How Long Has This Been Causing You Problems? 1 wk - 1 month  What Do You Feel Would Help You the Most Today? Treatment for Depression or other mood problem; Medication(s)   Have You Recently Had Any Thoughts About Hurting Yourself? No  Are You Planning to Commit Suicide/Harm Yourself At This time? No   Have you Recently Had Thoughts About Hurting Someone Cole Henderson? No  Are You Planning to Harm Someone at This Time? No  Explanation: Wants to stab whoever is after him with a knife or a pen to defend himself.   Have You Used Any Alcohol or Drugs in the Past 24 Hours? No  How Long Ago Did You Use Drugs or Alcohol? No data recorded What Did You Use and How Much? No data recorded  Do You Currently Have a Therapist/Psychiatrist? Yes  Name of Therapist/Psychiatrist: No data recorded  Have You Been Recently Discharged From Any Office Practice or Programs? No  Explanation of Discharge From Practice/Program: No data recorded   CCA Screening Triage Referral Assessment Type of Contact: Face-to-Face  Telemedicine Service Delivery:   Is this Initial or Reassessment? No data recorded Date Telepsych consult ordered in [facility]:  No data recorded Time Telepsych consult ordered in [facility]:  No data recorded Location of Assessment: [facility] Assessment Services  Provider Location: [facility] Assessment Services   Collateral Involvement: Daugher Cole Henderson   Does Patient Have a Automotive engineer Guardian? No data recorded Name and Contact of Legal Guardian: No data recorded If Minor and Not Living with Parent(s), Who has Custody? No data recorded Is CPS involved or ever been involved? Never  Is APS involved or ever been involved?  Never   Patient Determined To Be At Risk for Harm To Self or Others Based on Review of Patient Reported Information or Presenting Complaint? No  Method: No Plan  Availability of Means: No access or NA  Intent: Vague intent or NA  Notification Required: No need or identified person  Additional Information for Danger to Others Potential: Active psychosis  Additional Comments for Danger to Others Potential: Pt says he would use a pen or a knife to defend himself.  Are There Guns or Other Weapons in Your Home? No  Types of Guns/Weapons: No data recorded Are These Weapons Safely Secured?                            No data recorded Who Could Verify You Are Able To Have These Secured: No data recorded Do You Have any Outstanding Charges, Pending Court Dates, Parole/Probation? Unknown  Contacted To Inform of Risk of Harm To Self or Others: Other: Comment (N/A)   Does Patient Present under Involuntary Commitment? No  IVC Papers Initial File Date: No data recorded  [place] of Residence: [place]   Patient Currently Receiving the Following Services: Medication Management   Determination of  Need: Urgent (48 hours)   Options For Referral: Outpatient Therapy; Medication Management   Discharge Disposition:     Audree Camel, [facility]

## 2022-08-12 NOTE — ED Triage Notes (Signed)
Pt presents to [facility] escorted by [employer]. Per [employer] the pts daughter called 911 to transport the pt to this facility due to his worsening hallucinations. Pt states that he is due for his abilify shot and would like to receive his shot today and discharge home. Pt states he usually receives his shot at [facility]. Pt reports hearing a voice talking about politics and republicans and he is tired of it.Pt denies SI/HI and visual hallucinations at the moment.

## 2022-08-13 MED ORDER — OLANZAPINE 5 MG PO TABS: 5.0000 mg | ORAL_TABLET | Freq: Every day | ORAL | Status: DC

## 2022-08-13 NOTE — ED Notes (Signed)
Pt sleeping@this time. Breathing even and unlabored. Will continue to monitor for safety 

## 2022-08-13 NOTE — ED Notes (Addendum)
Pt is very verbally hostile he was cursing at staff. He has had snacks all night which includes cereal, chips and milk. He say due to him being hungry he should get large amounts of food and since we can't fucking do it. Let him the fuck out of here now and he will go and eat like he wants. Security was called and he was still verbally aggressive. Shalon NP was notified of pt behavior

## 2022-08-13 NOTE — ED Provider Notes (Signed)
FBC/OBS ASAP Discharge Summary  Date and Time: 08/13/2022 8:50 AM  Name: Cole Henderson  MRN:  [id number]   Discharge Diagnoses:  Final diagnoses:  Paranoid schizophrenia (HCC)  Stimulant abuse (HCC)    Subjective: Today, patient seen and re-evaluated by this provider face to face and case discussed with Dr. Octavia Bruckner. On evaluation, patient is alert and oriented x 4. His thought process is logical and speech is clear and coherent. His mood is anxious and affect is congruent. He denies SI/HI. He denies voices today. He states, I am not hallucinating today." There is no objective evidence that the patient is responding to internal or external stimuli. He reports a fair appetite. He reports fair sleep. He states that he woke up at 5:30 am and asked the staff for cereal but was denied. He states that the medicine makes him hungry. He states, "they act like they don't have food." I informed the patient that his UDS shows positive for amphetamines and methamphetamines. He states that he's been smoking meth since last Friday to help with the hallucinations. I explained to the patient that methamphetamine can increase the dopamine in the brain and exacerbate psychosis symptoms given his history of schizophrenia. He was advised to stop using illicit drugs and follow up with outpatient substance use resources. He denies side effects to starting Zyprexa. He was advised to take the Zyprexa 1 tablet at bedtime for 7 days. He states that he gets his Abilify injection tomorrow at [facility]. He was advised to follow up with [facility] on Monday, 08/14/22 for medication management and LAI.   Stay Summary: Cole Henderson is a 46 year old male patient with a past psychiatric history significant for paranoid schizophrenia who presented to the [facility] behavioral health urgent care voluntary accompanied by law enforcement with a chief complaint of hallucinations.   Triage note: Pt presents to [facility] escorted by [employer]. Per [employer] the pts  daughter called 911 to transport the pt to this facility due to his worsening hallucinations. Pt states that he is due for his abilify shot and would like to receive his shot today and discharge home. Pt states he usually receives his shot at [facility]. Pt reports hearing a voice talking about politics and republicans and he is tired of it. Pt denies SI/HI and visual hallucinations at the moment.  Admission: Patient was admitted to the [facility] behavioral health urgent care continuous assessment unit on 08/12/22 for overnight observation. Labs obtained included CBC, CMP, UDS, BAL, EKG, and COVID. Patient's UDS positive for amphetamines and methamphetamines. Patient was given a one time dose of Zyprexa 2.5 mg for tolerability and hallucinations. He was started on Zyprexa 5 mg p.o. nightly for hallucinations x 7 days in adjunction to his monthly Abilify LAI that he receives at [facility]. Patient was observed overnight without any aggressive, psychotic or self harm behaviors. He was noted by nursing to have periods of being "verbally hostile"  when requesting several snacks last night.   Safety planning: Patient gives consent to safety plan with his daughter Cole Henderson [phone]. I contacted [name] twice, no answer, left hippa compliant voicemail.   Patient gives consent to safety plan with his friend Cole Henderson [phone]. Cole Henderson states that he has no safety concerns with the patient discharging. He states that the patient will not physically hurt anyone but sometimes he is afraid that he may see a lizard and take off running and knock him over. He states that the patient has never been physically aggressive. He  states that when the patient is using drugs he appears to have hallucinations. He states that his plan is to pick the patient up from the facility and take him to his daughter Cole Henderson house. He states that the patient does not have any access to weapons in the home including guns. He was advised that if the patient's  symptoms worsen, call 911, take the patient to the nearest ED or [facility]UC.    Total Time spent with patient: 20 minutes  Past Psychiatric History: History of paranoid schizophrenia.   Past Medical History:  Past Medical History:  Diagnosis Date   Drug abuse (HCC)    Hypertension    Schizophrenia (HCC)    Tardive dyskinesia    No past surgical history on file. Family History: No family history on file. Family Psychiatric History: No history reported.  Social History:  Social History   Substance and Sexual Activity  Alcohol Use Yes     Social History   Substance and Sexual Activity  Drug Use Yes   Types: Methamphetamines    Social History   Socioeconomic History   Marital status: Single    Spouse name: Not on file   Number of children: Not on file   Years of education: Not on file   Highest education level: Not on file  Occupational History   Not on file  Tobacco Use   Smoking status: Every Day    Packs/day: 0.50    Types: Cigarettes   Smokeless tobacco: Never  Substance and Sexual Activity   Alcohol use: Yes   Drug use: Yes    Types: Methamphetamines   Sexual activity: Not on file  Other Topics Concern   Not on file  Social History Narrative   Not on file   Social Determinants of Health   Financial Resource Strain: Not on file  Food Insecurity: Not on file  Transportation Needs: Not on file  Physical Activity: Not on file  Stress: Not on file  Social Connections: Not on file   SDOH:  SDOH Screenings   Depression (PHQ2-9): Medium Risk (12/01/2020)  Tobacco Use: High Risk (05/14/2022)    Tobacco Cessation:  N/A, patient does not currently use tobacco products  Current Medications:  Current Facility-Administered Medications  Medication Dose Route Frequency Provider Last Rate Last Admin   alum & mag hydroxide-simeth (MAALOX/MYLANTA) 200-200-20 MG/5ML suspension 30 mL  30 mL Oral Q4H PRN Hildred Pharo L, NP       hydrOXYzine (ATARAX) tablet 25 mg  25  mg Oral TID PRN Deshante Cassell L, NP   25 mg at 08/12/22 2107   magnesium hydroxide (MILK OF MAGNESIA) suspension 30 mL  30 mL Oral Daily PRN Arden Axon L, NP       OLANZapine (ZYPREXA) tablet 5 mg  5 mg Oral QHS Deborah Lazcano L, NP   5 mg at 08/12/22 2107   Current Outpatient Medications  Medication Sig Dispense Refill   ARIPiprazole ER (ABILIFY MAINTENA) 400 MG SRER injection Inject 2 mLs (400 mg total) into the muscle every 28 (twenty-eight) days. 1 each 11   OLANZapine (ZYPREXA) 5 MG tablet Take 1 tablet (5 mg total) by mouth at bedtime for 7 days.     trihexyphenidyl (ARTANE) 5 MG tablet Take 1 tablet (5 mg total) by mouth 2 (two) times daily with a meal. 60 tablet 0    PTA Medications: (Not in a hospital admission)      12/01/2020    9:13 AM 08/28/2020  7:59 PM  Depression screen PHQ 2/9  Decreased Interest 1 0  Down, Depressed, Hopeless 1 0  PHQ - 2 Score 2 0  Altered sleeping 3 3  Tired, decreased energy 3 0  Change in appetite 3 3  Feeling bad or failure about yourself  3 0  Trouble concentrating 3 1  Moving slowly or fidgety/restless 2 2  Suicidal thoughts 0 0  PHQ-9 Score 19 9  Difficult doing work/chores Very difficult Somewhat difficult    Flowsheet Row ED from 08/12/2022 in [facility] ED from 05/12/2022 in [facility] ED from 05/11/2022 in [facility] No Risk No Risk No Risk       Musculoskeletal  Strength & Muscle Tone: within normal limits Gait & Station: normal Patient leans: N/A  Psychiatric Specialty Exam  Presentation  General Appearance: Appropriate for Environment  Eye Contact:Fair  Speech:Clear and Coherent  Speech Volume:Normal  Handedness:Right   Mood and Affect  Mood:Anxious  Affect:Congruent   Thought Process  Thought Processes:Coherent  Descriptions of Associations:Intact  Orientation:Full (Time, Place  and Person)  Thought Content:Logical  Diagnosis of Schizophrenia or Schizoaffective disorder in past: Yes  Duration of Psychotic Symptoms: Greater than six months   Hallucinations:Hallucinations: None  Ideas of Reference:None  Suicidal Thoughts:Suicidal Thoughts: No  Homicidal Thoughts:Homicidal Thoughts: No   Sensorium  Memory:Immediate Fair; Recent Fair; Remote Fair  Judgment:Intact  Insight:Present   Executive Functions  Concentration:Fair  Attention Span:Fair  [place]   Psychomotor Activity  Psychomotor Activity:Psychomotor Activity: Normal   Assets  Assets:Communication Skills; Desire for Improvement; Housing; Leisure Time; Physical Health; Social Support   Sleep  Sleep:Sleep: Fair Number of Hours of Sleep: 5   Nutritional Assessment (For OBS and FBC admissions only) Has the patient had a weight loss or gain of 10 pounds or more in the last 3 months?: No Has the patient had a decrease in food intake/or appetite?: No Does the patient have dental problems?: No Does the patient have eating habits or behaviors that may be indicators of an eating disorder including binging or inducing vomiting?: No Has the patient recently lost weight without trying?: 0 Has the patient been eating poorly because of a decreased appetite?: 0 Malnutrition Screening Tool Score: 0    Physical Exam  Physical Exam HENT:     Head: Normocephalic.     Nose: Nose normal.  Eyes:     Conjunctiva/sclera: Conjunctivae normal.  Cardiovascular:     Rate and Rhythm: Normal rate.  Pulmonary:     Effort: Pulmonary effort is normal.  Musculoskeletal:        General: Normal range of motion.     Cervical back: Normal range of motion.  Neurological:     Mental Status: He is alert and oriented to person, place, and time.    Review of Systems  Constitutional: Negative.   HENT: Negative.    Eyes: Negative.   Respiratory: Negative.     Cardiovascular: Negative.   Gastrointestinal: Negative.   Genitourinary: Negative.   Musculoskeletal: Negative.   Neurological: Negative.   Endo/Heme/Allergies: Negative.    Blood pressure (!) 128/92, pulse 97, temperature 98.6 F (37 C), temperature source Oral, resp. rate 18, SpO2 99 %. There is no height or weight on file to calculate BMI.  Demographic Factors:  Male and Caucasian  Loss Factors: Legal issues  Historical Factors: NA  Risk Reduction Factors:  Sense of responsibility to family, Employed, and Positive social support  Continued Clinical Symptoms:  Alcohol/Substance Abuse/Dependencies Previous Psychiatric Diagnoses and Treatments  Cognitive Features That Contribute To Risk:  None    Suicide Risk:  Minimal: No identifiable suicidal ideation.  Patients presenting with no risk factors but with morbid ruminations; may be classified as minimal risk based on the severity of the depressive symptoms  Plan Of Care/Follow-up recommendations:  Activity:  as tolerated  Discharge recommendations:  Patient is to take medications as prescribed. Take the Zyprexa 5 mg by mouth at bedtime for 7 days.   Follow up with [facility] for your monthly Abilify injection.   Please follow up with your primary care provider for all medical related needs.   Therapy: We recommend that patient participate in individual therapy to address mental health concerns.  Medications: The Patient is to contact a medical professional and/or outpatient provider to address any new side effects that develop. The patient should update outpatient providers of any new medications and/or medication changes.   Atypical antipsychotics: If you are prescribed an atypical antipsychotic, it is recommended that your height, weight, BMI, blood pressure, fasting lipid panel, and fasting blood sugar be monitored by your outpatient providers.  Safety:  The patient should abstain from use of illicit substances/drugs  and abuse of any medications. If symptoms worsen or do not continue to improve or if the patient becomes actively suicidal or homicidal then it is recommended that the patient return to the closest hospital emergency department, the [facility], or call 911 for further evaluation and treatment. National Suicide Prevention Lifeline 1-800-SUICIDE or 416-373-2725.  About 988 988 offers 24/7 access to trained crisis counselors who can help people experiencing mental health-related distress. People can call or text 988 or chat 988lifeline.org for themselves or if they are worried about a loved one who may need crisis support.   Disposition: discharge to self.   Adyn Hoes L, NP 08/13/2022, 8:50 AM

## 2022-08-13 NOTE — Discharge Instructions (Addendum)
Discharge recommendations:  Patient is to take medications as prescribed. Take the Zyprexa 5 mg by mouth at bedtime for 7 days.   Follow up with [facility] for your monthly Abilify injection.   Please follow up with your primary care provider for all medical related needs.   Therapy: We recommend that patient participate in individual therapy to address mental health concerns.  Medications: The Patient is to contact a medical professional and/or outpatient provider to address any new side effects that develop. The patient should update outpatient providers of any new medications and/or medication changes.   Atypical antipsychotics: If you are prescribed an atypical antipsychotic, it is recommended that your height, weight, BMI, blood pressure, fasting lipid panel, and fasting blood sugar be monitored by your outpatient providers.  Safety:  The patient should abstain from use of illicit substances/drugs and abuse of any medications. If symptoms worsen or do not continue to improve or if the patient becomes actively suicidal or homicidal then it is recommended that the patient return to the closest hospital emergency department, the [facility], or call 911 for further evaluation and treatment. National Suicide Prevention Lifeline 1-800-SUICIDE or (205)462-5240.  About 988 988 offers 24/7 access to trained crisis counselors who can help people experiencing mental health-related distress. People can call or text 988 or chat 988lifeline.org for themselves or if they are worried about a loved one who may need crisis support.

## 2022-08-13 NOTE — ED Notes (Signed)
Pt pacing unit demanding to food and to be discharged, provider made aware.  He denies SI, HI, and avh.  Breathing is even and unlabored.  Will continue to monitor for safety.

## 2022-09-01 ENCOUNTER — Emergency Department (HOSPITAL_COMMUNITY)
Admission: EM | Admit: 2022-09-01 | Discharge: 2022-09-04 | Disposition: A | Discharge status: Other Acute Inpatient Hospital | Payer: Self-pay | Attending: Emergency Medicine | Admitting: Emergency Medicine

## 2022-09-01 DIAGNOSIS — F209 Schizophrenia, unspecified: Secondary | ICD-10-CM | POA: Insufficient documentation

## 2022-09-01 DIAGNOSIS — F1510 Other stimulant abuse, uncomplicated: Secondary | ICD-10-CM

## 2022-09-01 DIAGNOSIS — Z1152 Encounter for screening for COVID-19: Secondary | ICD-10-CM | POA: Insufficient documentation

## 2022-09-01 DIAGNOSIS — F29 Unspecified psychosis not due to a substance or known physiological condition: Secondary | ICD-10-CM

## 2022-09-01 DIAGNOSIS — R4189 Other symptoms and signs involving cognitive functions and awareness: Secondary | ICD-10-CM | POA: Insufficient documentation

## 2022-09-01 DIAGNOSIS — R45850 Homicidal ideations: Secondary | ICD-10-CM | POA: Insufficient documentation

## 2022-09-01 DIAGNOSIS — R4789 Other speech disturbances: Secondary | ICD-10-CM | POA: Insufficient documentation

## 2022-09-01 DIAGNOSIS — Z046 Encounter for general psychiatric examination, requested by authority: Secondary | ICD-10-CM | POA: Insufficient documentation

## 2022-09-01 NOTE — ED Triage Notes (Signed)
Pt BIB [employer] under IVC, per paperwork, pt hallucinating, not taking medications, and destroying things in the home. Pt easily agitated, reports drinking a fifth of liquor today.

## 2022-09-02 ENCOUNTER — Encounter (HOSPITAL_COMMUNITY): Payer: Self-pay | Admitting: Emergency Medicine

## 2022-09-02 ENCOUNTER — Other Ambulatory Visit: Payer: Self-pay

## 2022-09-02 LAB — CBC
HCT: 39.5 % (ref 39.0–52.0)
Hemoglobin: 12.8 g/dL — ABNORMAL LOW (ref 13.0–17.0)
MCH: 27.7 pg (ref 26.0–34.0)
MCHC: 32.4 g/dL (ref 30.0–36.0)
MCV: 85.5 fL (ref 80.0–100.0)
Platelets: 274 10*3/uL (ref 150–400)
RBC: 4.62 MIL/uL (ref 4.22–5.81)
RDW: 13.6 % (ref 11.5–15.5)
WBC: 7.4 10*3/uL (ref 4.0–10.5)
nRBC: 0 % (ref 0.0–0.2)

## 2022-09-02 LAB — COMPREHENSIVE METABOLIC PANEL
ALT: 38 U/L (ref 0–44)
AST: 33 U/L (ref 15–41)
Albumin: 4.1 g/dL (ref 3.5–5.0)
Alkaline Phosphatase: 46 U/L (ref 38–126)
Anion gap: 9 (ref 5–15)
BUN: 10 mg/dL (ref 6–20)
CO2: 28 mmol/L (ref 22–32)
Calcium: 9.5 mg/dL (ref 8.9–10.3)
Chloride: 103 mmol/L (ref 98–111)
Creatinine, Ser: 0.97 mg/dL (ref 0.61–1.24)
GFR, Estimated: >60 mL/min (ref >60)
Glucose, Bld: 131 mg/dL — ABNORMAL HIGH (ref 70–99)
Potassium: 4.2 mmol/L (ref 3.5–5.1)
Sodium: 140 mmol/L (ref 135–145)
Total Bilirubin: 0.6 mg/dL (ref 0.3–1.2)
Total Protein: 6.9 g/dL (ref 6.5–8.1)

## 2022-09-02 LAB — RAPID URINE DRUG SCREEN, HOSP PERFORMED
Amphetamines: POSITIVE — AB
Barbiturates: NOT DETECTED
Benzodiazepines: NOT DETECTED
Cocaine: NOT DETECTED
Opiates: POSITIVE — AB
Tetrahydrocannabinol: NOT DETECTED

## 2022-09-02 LAB — SARS CORONAVIRUS 2 BY RT PCR: SARS Coronavirus 2 by RT PCR: NEGATIVE

## 2022-09-02 LAB — ETHANOL: Alcohol, Ethyl (B): <10 mg/dL (ref <10)

## 2022-09-02 MED ORDER — OLANZAPINE 10 MG PO TABS
10.0000 mg | ORAL_TABLET | Freq: Every day | ORAL | Status: DC
  Administered 2022-09-02 – 2022-09-03 (×2): 10 mg via ORAL
  Filled 2022-09-02 (×2): qty 1

## 2022-09-02 MED ORDER — TRIHEXYPHENIDYL HCL 2 MG PO TABS
5.0000 mg | ORAL_TABLET | Freq: Two times a day (BID) | ORAL | Status: DC
  Administered 2022-09-02 – 2022-09-04 (×6): 5 mg via ORAL
  Filled 2022-09-02 (×3): qty 1
  Filled 2022-09-02: qty 3
  Filled 2022-09-02: qty 1
  Filled 2022-09-02: qty 3
  Filled 2022-09-02: qty 1
  Filled 2022-09-02: qty 3
  Filled 2022-09-02: qty 1

## 2022-09-02 MED ORDER — OLANZAPINE 5 MG PO TABS: 5.0000 mg | ORAL_TABLET | Freq: Every day | ORAL | Status: DC

## 2022-09-02 MED ORDER — OLANZAPINE 10 MG IM SOLR
10.0000 mg | Freq: Once | INTRAMUSCULAR | Status: AC
  Administered 2022-09-02: 10 mg via INTRAMUSCULAR
  Filled 2022-09-02: qty 10

## 2022-09-02 MED ORDER — STERILE WATER FOR INJECTION IJ SOLN
INTRAMUSCULAR | Status: AC
  Administered 2022-09-02: 10 mL
  Filled 2022-09-02: qty 10

## 2022-09-02 NOTE — ED Notes (Signed)
IVC paperwork was giving to the nurse in purple

## 2022-09-02 NOTE — Progress Notes (Addendum)
CSW spoke with Pamala Hurry with [facility]. It was reported that this patient is under review. Pamala Hurry requested a UA, Urine Drug screen, a tylenol level, and IVC to be faxed to [phone]) [phone] for further review.  Glennie Isle, MSW, [facility] Phone: [phone] Disposition/TOC

## 2022-09-02 NOTE — Progress Notes (Signed)
Per Vesta Mixer, NP, patient meets criteria for inpatient treatment. There are no available beds at [facility] today. CSW faxed referrals to the following facilities for review:  [address] [place] [address] [phone] --  [place] N/A [address] [place] [address] [phone] --  [facility] [address] [place] [address] [phone] --  [place] N/A [place], [place] [address] [phone] --  [place] [address] [place], [place] [address] [phone] --  [address] [place] [address] [phone] --  [facility]  Pending - Request Sent N/A [address] [place] [address] [place] [address] [place] [address] [phone] --  [facility]  Pending - Request Sent N/A [address] [place] [address] [phone] --  [facility]  Pending - Request Sent N/A [address], [place] [address] [phone] --  [facility]  Pending - Request Sent N/A [place], [place] [address] [phone] --  [facility]  Pending - Request Sent N/A [address], [place] [address] [phone] --  [facility]  Pending - Request Sent N/A [address], [place] [address] [phone] --  [facility]  Pending - Request Sent N/A [address] [place] [address] [phone] --   TTS will continue to seek bed placement.  Glennie Isle, MSW, [place] Phone: [phone] Disposition/TOC

## 2022-09-02 NOTE — ED Provider Notes (Addendum)
Currently first exam for IVC was not completed.  I completed that.  Turned it into the clerk.  Patient resting awoke him.  No specific complaints.  Nontoxic no acute distress.  Patient IVC for homicidal ideation hallucinations of seeing people getting angry over these people threatening to kill them and also destruction of his home because of these people being here.   Fredia Sorrow, MD 09/02/22 1358    Fredia Sorrow, MD 09/02/22 [phone]

## 2022-09-02 NOTE — BH Assessment (Signed)
Per Dr. Kumar in the 9am morning huddle/bed meeting, Provider to assess. No TTS CCA is needed at this time. 

## 2022-09-02 NOTE — BH Assessment (Signed)
Received TTS consult order. Per RN, Pt is currently too somnolent to participate in tele-assessment.   Stanly Si Ellis Shamiah Kahler Jr, LCMHC, NCC Triage Specialist [phone]  

## 2022-09-02 NOTE — ED Provider Notes (Signed)
[facility] EMERGENCY DEPARTMENT Provider Note   CSN: [id number] Arrival date & time: 09/01/22  2356     History  Chief Complaint  Patient presents with   IVC    [facility] is a 46 y.o. male.  The history is provided by the patient.  Mental Health Problem Presenting symptoms: disorganized speech, disorganized thought process and homicidal ideas   Presenting symptoms: no aggressive behavior   Patient accompanied by:  Law enforcement Degree of incapacity (severity):  Moderate Onset quality:  Gradual Timing:  Constant Progression:  Worsening Chronicity:  Recurrent Context: noncompliance   Treatment compliance:  Untreated Relieved by:  Nothing Worsened by:  Nothing Ineffective treatments:  None tried Associated symptoms: distractible   Risk factors: hx of mental illness   Patient with schizophrenia Under IVC for noncompliance and hallucination and homicidal thoughts regarding his hallucinations.       Home Medications Prior to Admission medications   Medication Sig Start Date End Date Taking? Authorizing Provider  ARIPiprazole ER (ABILIFY MAINTENA) 400 MG SRER injection Inject 2 mLs (400 mg total) into the muscle every 28 (twenty-eight) days. 05/23/22   Hampton Abbot, MD  OLANZapine (ZYPREXA) 5 MG tablet Take 1 tablet (5 mg total) by mouth at bedtime for 7 days. 08/13/22 08/20/22  Marissa Calamity, NP  trihexyphenidyl (ARTANE) 5 MG tablet Take 1 tablet (5 mg total) by mouth 2 (two) times daily with a meal. 05/12/22   Rankin, Shuvon B, NP      Allergies    Benztropine mesylate, Fish allergy, Haloperidol, Haloperidol lactate, Shellfish allergy, Cephalexin, Cogentin [benztropine], Risperidone, Risperidone and related, Strawberry extract, and Geodon [ziprasidone]    Review of Systems   Review of Systems  Unable to perform ROS: Psychiatric disorder  Constitutional:  Negative for fever.  HENT:  Negative for facial swelling.   Eyes:  Negative for redness.   Psychiatric/Behavioral:  Positive for decreased concentration and homicidal ideas.     Physical Exam Updated Vital Signs BP 118/79   Pulse 96   Temp 98.5 F (36.9 C)   Resp 16   SpO2 96%  Physical Exam Vitals and nursing note reviewed.  Constitutional:      General: He is not in acute distress.    Appearance: He is well-developed. He is not diaphoretic.  HENT:     Head: Normocephalic and atraumatic.     Nose: Nose normal.  Eyes:     Conjunctiva/sclera: Conjunctivae normal.     Pupils: Pupils are equal, round, and reactive to light.  Cardiovascular:     Rate and Rhythm: Normal rate and regular rhythm.     Pulses: Normal pulses.     Heart sounds: Normal heart sounds.  Pulmonary:     Effort: Pulmonary effort is normal.     Breath sounds: Normal breath sounds. No wheezing or rales.  Abdominal:     General: Bowel sounds are normal.     Palpations: Abdomen is soft.     Tenderness: There is no abdominal tenderness. There is no guarding or rebound.  Musculoskeletal:        General: Normal range of motion.     Cervical back: Normal range of motion and neck supple.  Skin:    General: Skin is warm and dry.     Capillary Refill: Capillary refill takes less than 2 seconds.  Neurological:     General: No focal deficit present.     Mental Status: He is alert.     Deep  Tendon Reflexes: Reflexes normal.  Psychiatric:        Mood and Affect: Mood normal.        Behavior: Behavior normal.     ED Results / Procedures / Treatments   Labs (all labs ordered are listed, but only abnormal results are displayed) Results for orders placed or performed during the hospital encounter of 09/01/22  Comprehensive metabolic panel  Result Value Ref Range   Sodium 140 135 - 145 mmol/L   Potassium 4.2 3.5 - 5.1 mmol/L   Chloride 103 98 - 111 mmol/L   CO2 28 22 - 32 mmol/L   Glucose, Bld 131 (H) 70 - 99 mg/dL   BUN 10 6 - 20 mg/dL   Creatinine, Ser 6.64 0.61 - 1.24 mg/dL   Calcium 9.5 8.9 -  40.3 mg/dL   Total Protein 6.9 6.5 - 8.1 g/dL   Albumin 4.1 3.5 - 5.0 g/dL   AST 33 15 - 41 U/L   ALT 38 0 - 44 U/L   Alkaline Phosphatase 46 38 - 126 U/L   Total Bilirubin 0.6 0.3 - 1.2 mg/dL   GFR, Estimated >47 >42 mL/min   Anion gap 9 5 - 15  Ethanol  Result Value Ref Range   Alcohol, Ethyl (B) <10 <10 mg/dL  cbc  Result Value Ref Range   WBC 7.4 4.0 - 10.5 K/uL   RBC 4.62 4.22 - 5.81 MIL/uL   Hemoglobin 12.8 (L) 13.0 - 17.0 g/dL   HCT 59.5 63.8 - 75.6 %   MCV 85.5 80.0 - 100.0 fL   MCH 27.7 26.0 - 34.0 pg   MCHC 32.4 30.0 - 36.0 g/dL   RDW 43.3 29.5 - 18.8 %   Platelets 274 150 - 400 K/uL   nRBC 0.0 0.0 - 0.2 %   No results found.   Radiology No results found.  Procedures Procedures    Medications Ordered in ED Medications  OLANZapine (ZYPREXA) injection 10 mg (has no administration in time range)    ED Course/ Medical Decision Making/ A&P                           Medical Decision Making Patient under IVC for noncompliance hallucinations and homicidal ideations   Amount and/or Complexity of Data Reviewed Independent Historian:     Details: Police see above  External Data Reviewed: notes.    Details: Previous notes reviewed  Labs: ordered.    Details: All labs reviewed:  normal white count 7.4, hemoglobin low 12.8, normal platelet count.  Alcohol is negative.  Sodium 140 normal, potassium normal 4.2, normal creatinine .97, normal LFTS  Risk Prescription drug management. Risk Details: Patient medically cleared by me for psychiatry     Final Clinical Impression(s) / ED Diagnoses Final diagnoses:  None   The patient has been placed in psychiatric observation due to the need to provide a safe environment for the patient while obtaining psychiatric consultation and evaluation, as well as ongoing medical and medication management to treat the patient's condition.  The patient has been placed under full IVC at this time.  Rx / DC Orders ED Discharge  Orders     None         Amrutha Avera, MD 09/02/22 4166

## 2022-09-02 NOTE — Consult Note (Signed)
[facility] ED ASSESSMENT   Reason for Consult:  Eval Referring Physician:  Dr. Nicanor Alcon Patient Identification: [facility] MRN:  [id number] ED Chief Complaint: Schizophrenia, unspecified (HCC)  Diagnosis:  Principal Problem:   Schizophrenia, unspecified [facility])   ED Assessment Time Calculation: Start Time: 0930 Stop Time: 1000 Total Time in Minutes (Assessment Completion): 30   HPI:   [facility] is a 46 y.o. male patient who was brought in by [facility] Department under IVC.  According to paperwork patient has been hallucinating, not taking medications, destroying property in the home, increased agitation, and drinking liquor daily.  Unable to confirm if there has been illicit substance use, current urine toxicology screening is still pending.   Subjective:   Patient seen this morning at [facility], ED for face-to-face evaluation.  He is sleeping and a little difficult to arouse.  He was given 10 mg of Zyprexa IM at 0045 for agitation.  His speech is low in volume, and very garbled he was difficult to understand.  He does not engage much in conversation, he often fell asleep during the middle of my question.  He shook his head indicating no when I asked if he was suicidal or homicidal.  He did shake his head indicating yes when asked if he is hearing voices.  He would not elaborate on what he is hearing.  When asked if he is using any illicit substances he denied this.  His IVC paperwork reports he is drinking 1/5 of liquor daily however he denied this and said he does not drink every day.  Patient then stopped answering my questions and went back to sleep.  I was able to speak to his daughter Cole Henderson who he lives with at [phone].  She tells me he started drinking daily and believes he stopped taking his medications like he should.  She tells me he has not slept in multiple nights, talks to himself throughout the whole night, and can be seen staring at the ceiling and the walls as if he is  looking at something that is not there.  She does confirm he received his Abilify LAI on September 11.  She is unsure if he relapsed on substances.  She stated before when he was using meth he was acting similar to how he is now so she is hoping he did not relapse on drugs.  She stated he is also been very agitated at home, and has been destroying some property in the home and yelling at her and his mother who also lives in the home and his older.  She does not feel like he is safe to come home at this time.  Urine drug screen is still pending, it is possible relapse of substances exacerbated his schizophrenia symptoms.  There is also a question if he has been compliant with medications or not.  Will recommend patient for inpatient psychiatric treatment for further stabilization and medication management.  Past Psychiatric History:  History of paranoid schizophrenia  Risk to Self or Others: Is the patient at risk to self? Yes Has the patient been a risk to self in the past 6 months? No Has the patient been a risk to self within the distant past? No Is the patient a risk to others? Yes Has the patient been a risk to others in the past 6 months? No Has the patient been a risk to others within the distant past? No  [place] Scale:  Flowsheet Row ED from 09/01/2022 in [facility] EMERGENCY DEPART[facility] ED from 08/12/2022 in [facility] ED from [facility]/12/2022 in [facility]  C-SSRS RISK CATEGORY No Risk No Risk No Risk       Past Medical History:  Past Medical History:  Diagnosis Date   Drug abuse (HCC)    Hypertension    Schizophrenia (HCC)    Tardive dyskinesia    History reviewed. No pertinent surgical history.  Social History:  Social History   Substance and Sexual Activity  Alcohol Use Yes     Social History   Substance and Sexual Activity  Drug Use Yes   Types: Methamphetamines    Social History    Socioeconomic History   Marital status: Single    Spouse name: Not on file   Number of children: Not on file   Years of education: Not on file   Highest education level: Not on file  Occupational History   Not on file  Tobacco Use   Smoking status: Every Day    Packs/day: 0.50    Types: Cigarettes   Smokeless tobacco: Never  Substance and Sexual Activity   Alcohol use: Yes   Drug use: Yes    Types: Methamphetamines   Sexual activity: Not on file  Other Topics Concern   Not on file  Social History Narrative   Not on file   Social Determinants of Health   Financial Resource Strain: Not on file  Food Insecurity: Not on file  Transportation Needs: Not on file  Physical Activity: Not on file  Stress: Not on file  Social Connections: Not on file   Additional Social History:    Allergies:   Allergies  Allergen Reactions   Benztropine Mesylate    Fish Allergy Anaphylaxis   Haloperidol    Haloperidol Lactate Nausea And Vomiting   Shellfish Allergy Anaphylaxis   Cephalexin Nausea And Vomiting   Cogentin [Benztropine] Hives   Risperidone    Risperidone And Related Other (See Comments)    Breasts grew   Strawberry Extract Hives   Geodon [Ziprasidone] Other (See Comments)    Extended erection (6 hours)    Labs:  Results for orders placed or performed during the hospital encounter of 09/01/22 (from the past 48 hour(s))  Comprehensive metabolic panel     Status: Abnormal   Collection Time: 09/02/22 12:14 AM  Result Value Ref Range   Sodium 140 135 - 145 mmol/L   Potassium 4.2 3.5 - 5.1 mmol/L   Chloride 103 98 - 111 mmol/L   CO2 28 22 - 32 mmol/L   Glucose, Bld 131 (H) 70 - 99 mg/dL    Comment: Glucose reference range applies only to samples taken after fasting for at least 8 hours.   BUN 10 6 - 20 mg/dL   Creatinine, Ser 3.61 0.61 - 1.24 mg/dL   Calcium 9.5 8.9 - 44.3 mg/dL   Total Protein 6.9 6.5 - 8.1 g/dL   Albumin 4.1 3.5 - 5.0 g/dL   AST 33 15 - 41 U/L    ALT 38 0 - 44 U/L   Alkaline Phosphatase 46 38 - 126 U/L   Total Bilirubin 0.6 0.3 - 1.2 mg/dL   GFR, Estimated >15 >40 mL/min    Comment: (NOTE) Calculated using the CKD-EPI Creatinine Equation (2021)    Anion gap 9 5 - 15    Comment: Performed at [facility], [address], [place], [place] [address]  Ethanol  Status: None   Collection Time: 09/02/22 12:14 AM  Result Value Ref Range   Alcohol, Ethyl (B) <10 <10 mg/dL    Comment: (NOTE) Lowest detectable limit for serum alcohol is 10 mg/dL.  For medical purposes only. Performed at [facility], [address] [place], [place] [address]   cbc     Status: Abnormal   Collection Time: 09/02/22 12:14 AM  Result Value Ref Range   WBC 7.4 4.0 - 10.5 K/uL   RBC 4.62 4.22 - 5.81 MIL/uL   Hemoglobin 12.8 (L) 13.0 - 17.0 g/dL   HCT 73.5 32.9 - 92.4 %   MCV 85.5 80.0 - 100.0 fL   MCH 27.7 26.0 - 34.0 pg   MCHC 32.4 30.0 - 36.0 g/dL   RDW 26.8 34.1 - 96.2 %   Platelets 274 150 - 400 K/uL   nRBC 0.0 0.0 - 0.2 %    Comment: Performed at [facility], [address] [place], [place] [address]  SARS Coronavirus 2 by RT PCR (hospital order, performed in [facility] hospital lab) *cepheid single result test* Anterior Nasal Swab     Status: None   Collection Time: 09/02/22  1:29 AM   Specimen: Anterior Nasal Swab  Result Value Ref Range   SARS Coronavirus 2 by RT PCR NEGATIVE NEGATIVE    Comment: (NOTE) SARS-CoV-2 target nucleic acids are NOT DETECTED.  The SARS-CoV-2 RNA is generally detectable in upper and lower respiratory specimens during the acute phase of infection. The lowest concentration of SARS-CoV-2 viral copies this assay can detect is 250 copies / mL. A negative result does not preclude SARS-CoV-2 infection and should not be used as the sole basis for treatment or other patient management decisions.  A negative result may occur with improper specimen collection / handling, submission of specimen  other than nasopharyngeal swab, presence of viral mutation(s) within the areas targeted by this assay, and inadequate number of viral copies (<250 copies / mL). A negative result must be combined with clinical observations, patient history, and epidemiological information.  Fact Sheet for Patients:   RoadLapTop.co.za  Fact Sheet for Healthcare Providers: http://kim-miller.com/  This test is not yet approved or  cleared by the [place] FDA and has been authorized for detection and/or diagnosis of SARS-CoV-2 by FDA under an Emergency Use Authorization (EUA).  This EUA will remain in effect (meaning this test can be used) for the duration of the COVID-19 declaration under Section 564(b)(1) of the Act, 21 U.S.C. section 360bbb-3(b)(1), unless the authorization is terminated or revoked sooner.  Performed at [facility], [address], [place], [place] [address]     Current Facility-Administered Medications  Medication Dose Route Frequency Provider Last Rate Last Admin   OLANZapine (ZYPREXA) tablet 10 mg  10 mg Oral QHS Eligha Bridegroom, NP       trihexyphenidyl (ARTANE) tablet 5 mg  5 mg Oral BID WC Eligha Bridegroom, NP       Current Outpatient Medications  Medication Sig Dispense Refill   ARIPiprazole ER (ABILIFY MAINTENA) 400 MG SRER injection Inject 2 mLs (400 mg total) into the muscle every 28 (twenty-eight) days. 1 each 11   OLANZapine (ZYPREXA) 5 MG tablet Take 1 tablet (5 mg total) by mouth at bedtime for 7 days.     trihexyphenidyl (ARTANE) 5 MG tablet Take 1 tablet (5 mg total) by mouth 2 (two) times daily with a meal. 60 tablet 0   Psychiatric Specialty Exam:  Presentation  General Appearance:  Fairly Groomed  Eye Contact: Fair  Speech: Garbled  Speech Volume: Decreased  Handedness: Right   Mood and Affect  Mood: Euthymic  Affect: Flat   Thought Process  Thought  Processes: Linear  Descriptions of Associations:Circumstantial  Orientation:Full (Time, Place and Person)  Thought Content:WDL  History of Schizophrenia/Schizoaffective disorder:Yes  Duration of Psychotic Symptoms:Greater than six months  Hallucinations:Hallucinations: Auditory  Ideas of Reference:None  Suicidal Thoughts:Suicidal Thoughts: No  Homicidal Thoughts:Homicidal Thoughts: No   Sensorium  Memory: Immediate Fair; Recent Fair  Judgment: Impaired  Insight: Fair   Materials engineer: Fair  Attention Span: Fair  Recall: [employer] of Knowledge: Fair  Language: Fair   Psychomotor Activity  Psychomotor Activity: Psychomotor Activity: Restlessness   Assets  Assets: Physical Health; Resilience; Social Support; Housing    Sleep  Sleep: Sleep: Poor   Physical Exam: Physical Exam Neurological:     Mental Status: He is alert and oriented to person, place, and time.  Psychiatric:        Behavior: Behavior is cooperative.        Thought Content: Thought content normal.    Review of Systems  Psychiatric/Behavioral:  Positive for hallucinations. The patient has insomnia.   All other systems reviewed and are negative.  Blood pressure 118/79, pulse 96, temperature 98.5 F (36.9 C), resp. rate 16, SpO2 96 %. There is no height or weight on file to calculate BMI.  Medical Decision Making: Patient case reviewed and discussed with Dr. Dwyane Dee.  Patient had difficulty engaging in conversation, is still endorsing auditory hallucinations, and daughter has valid safety concerns about him returning home.  Will recommend inpatient psychiatric treatment.  EDP, RN, and LCSW notified of disposition.  CSW to fax outpatient.  Problem 1: Hallucinations Increase Zyprexa to 10mg  po Qhs  Home medications restarted:  Artane 5mg  BID with meals for TD  Disposition: Recommend psychiatric Inpatient admission when medically cleared.  Vesta Mixer, NP 09/02/2022 10:25 AM

## 2022-09-03 DIAGNOSIS — F1510 Other stimulant abuse, uncomplicated: Secondary | ICD-10-CM

## 2022-09-03 DIAGNOSIS — F209 Schizophrenia, unspecified: Secondary | ICD-10-CM

## 2022-09-03 LAB — URINALYSIS, COMPLETE (UACMP) WITH MICROSCOPIC
Bacteria, UA: NONE SEEN
Bilirubin Urine: NEGATIVE
Glucose, UA: NEGATIVE mg/dL
Hgb urine dipstick: NEGATIVE
Ketones, ur: NEGATIVE mg/dL
Leukocytes,Ua: NEGATIVE
Nitrite: NEGATIVE
Protein, ur: NEGATIVE mg/dL
Specific Gravity, Urine: 1.021 (ref 1.005–1.030)
pH: 5 (ref 5.0–8.0)

## 2022-09-03 LAB — ACETAMINOPHEN LEVEL: Acetaminophen (Tylenol), Serum: <10 ug/mL — ABNORMAL LOW (ref 10–30)

## 2022-09-03 MED ORDER — OLANZAPINE 5 MG PO TABS
5.0000 mg | ORAL_TABLET | Freq: Every day | ORAL | Status: DC
  Administered 2022-09-03 – 2022-09-04 (×2): 5 mg via ORAL
  Filled 2022-09-03 (×2): qty 1

## 2022-09-03 NOTE — ED Provider Notes (Signed)
Emergency Medicine Observation Re-evaluation Note  [facility] is a 46 y.o. male, seen on rounds today.  Pt initially presented to the ED for complaints of IVC Currently, the patient is IVC started on medications by behavioral health waiting placement by behavioral health.  Patient with concern about his blood pressure being elevated.  This been slightly up but not significantly elevated no significant symptoms..  Physical Exam  BP (!) 145/107 (BP Location: Right Arm)   Pulse 92   Temp 98.7 F (37.1 C)   Resp 20   SpO2 98%  Physical Exam General: Awake not real cooperative Cardiac: Regular rate and rhythm Lungs: No acute distress Psych: Agitated at times  ED Course / MDM  EKG:EKG Interpretation  Date/Time:  Saturday September 02 2022 00:11:52 EDT Ventricular Rate:  87 PR Interval:  150 QRS Duration: 86 QT Interval:  348 QTC Calculation: 418 R Axis:   54 Text Interpretation: Normal sinus rhythm Normal ECG When compared with ECG of 12-Aug-2022 14:15, No significant change was found Confirmed by Delora Fuel ([id number]) on 09/03/2022 5:38:18 AM  I have reviewed the labs performed to date as well as medications administered while in observation.  Recent changes in the last 24 hours include blood pressures been about 145/100.  Sometimes lower.  Patient completely asymptomatic patient claims he was on lisinopril in the past.  We go back to 2021 and cannot verify any medications.  We will hold off for right now just trend his blood pressure..  Plan  Current plan is for placement by behavioral health.  And patient is IVC.    Fredia Sorrow, MD 09/03/22 1700

## 2022-09-03 NOTE — Progress Notes (Signed)
Per Vesta Mixer, NP, patient meets criteria for inpatient treatment. There are no available beds at [facility] today. CSW re-faxed referrals to the following facilities for review:  [place] [address] [place] [address] [phone] --  [place] N/A [address], [place] [address] [phone] --  [facility] [address] [place] [address] [phone] --  [place] N/A [place], [place] [address] [phone] --  [place] [address] [place], [place] [address] [phone] --  [place] [address] [place] [address] [phone] --  [facility]  Pending - Request Sent N/A [address], [place] [address] [place] [address], [place] [address] [phone] --  [facility]  Pending - Request Sent N/A [address] [place] [address] [phone] --  [facility]  Pending - Request Sent N/A [address] [place] [address] [phone] --  [facility]  Pending - Request Sent N/A [place], [place] [address] [phone] --  [facility]  Pending - Request Sent N/A [address], [place] [address] [phone] --  [facility]  Pending - Request Sent N/A [address] [place] [address] [phone] --  [facility]  Pending - Request Sent N/A  [address] [place] [address] [phone] --   TTS will continue to seek bed placement.  Glennie Isle, MSW, [place] Phone: [phone] Disposition/TOC

## 2022-09-03 NOTE — Progress Notes (Signed)
[facility] Psych ED Progress Note  09/03/2022 3:10 PM Cole Henderson  MRN:  [id number]   Subjective:   Patient seen this morning at [facility] for face to face reevaluation. His speech is more clear today, and he is able to engage more in logical conversation. He tells me he slept well last night. No problems with appetite. He does endorse methamphetamine relapse prior to hospitalization. He is requesting to attend a residential substance abuse tx facility. He denies any suicidal or homicidal ideations. He denies any visual hallucinations. He continues to endorse auditory hallucinations. He begins rambling about how the democratic party is in his head speaking to him. 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 I explained to him meth will only worsen his hallucinations, and his psychotropics are important to stay compliant to. He appeared to be understanding of this. Will continue to recommend IP treatment, from there they can further establish if substance abuse tx will be needed after medication stabilization.   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[facility])   ED Assessment Time Calculation: Start Time: 1230 Stop Time: 1250 Total Time in Minutes (Assessment Completion): 20   Past Psychiatric History:  schizophrenia  [place] Scale:  Flowsheet Row ED from 09/01/2022 in [facility] EMERGENCY DEPARTMENT ED from 08/12/2022 in [facility] ED from 05/12/2022 in [facility]  C-SSRS RISK CATEGORY No Risk No Risk No Risk       Past Medical History:  Past Medical History:  Diagnosis Date   Drug abuse (HCC)    Hypertension    Schizophrenia (HCC)    Tardive dyskinesia    History  reviewed. No pertinent surgical history. Family History: History reviewed. No pertinent family history. Social History:  Social History   Substance and Sexual Activity  Alcohol Use Yes     Social History   Substance and Sexual Activity  Drug Use Yes   Types: Methamphetamines    Social History   Socioeconomic History   Marital status: Single    Spouse name: Not on file   Number of children: Not on file   Years of education: Not on file   Highest education level: Not on file  Occupational History   Not on file  Tobacco Use   Smoking status: Every Day    Packs/day: 0.50    Types: Cigarettes   Smokeless tobacco: Never  Substance and Sexual Activity   Alcohol use: Yes   Drug use: Yes    Types: Methamphetamines   Sexual activity: Not on file  Other Topics Concern   Not on file  Social History Narrative   Not on file   Social Determinants of Health   Financial Resource Strain: Not on file  Food Insecurity: Not on file  Transportation Needs: Not on file  Physical Activity: Not on file  Stress: Not on file  Social Connections: Not on file    Sleep: Good  Appetite:  Good  Current Medications: Current Facility-Administered Medications  Medication Dose Route Frequency Provider Last Rate Last Admin   OLANZapine (ZYPREXA) tablet 10 mg  10 mg Oral QHS Eligha Bridegroom, NP   10 mg at 09/02/22 2151   OLANZapine (ZYPREXA) tablet 5 mg  5 mg  Oral Daily Eligha Bridegroom, NP   5 mg at 09/03/22 1240   trihexyphenidyl (ARTANE) tablet 5 mg  5 mg Oral BID WC Eligha Bridegroom, NP   5 mg at 09/03/22 4431   Current Outpatient Medications  Medication Sig Dispense Refill   ARIPiprazole ER (ABILIFY MAINTENA) 400 MG SRER injection Inject 2 mLs (400 mg total) into the muscle every 28 (twenty-eight) days. (Patient not taking: Reported on 09/02/2022) 1 each 11   OLANZapine (ZYPREXA) 5 MG tablet Take 1 tablet (5 mg total) by mouth at bedtime for 7 days. (Patient not taking: Reported on  09/02/2022)     trihexyphenidyl (ARTANE) 5 MG tablet Take 1 tablet (5 mg total) by mouth 2 (two) times daily with a meal. (Patient not taking: Reported on 09/02/2022) 60 tablet 0    Lab Results:  Results for orders placed or performed during the hospital encounter of 09/01/22 (from the past 48 hour(s))  Comprehensive metabolic panel     Status: Abnormal   Collection Time: 09/02/22 12:14 AM  Result Value Ref Range   Sodium 140 135 - 145 mmol/L   Potassium 4.2 3.5 - 5.1 mmol/L   Chloride 103 98 - 111 mmol/L   CO2 28 22 - 32 mmol/L   Glucose, Bld 131 (H) 70 - 99 mg/dL    Comment: Glucose reference range applies only to samples taken after fasting for at least 8 hours.   BUN 10 6 - 20 mg/dL   Creatinine, Ser 5.40 0.61 - 1.24 mg/dL   Calcium 9.5 8.9 - 08.6 mg/dL   Total Protein 6.9 6.5 - 8.1 g/dL   Albumin 4.1 3.5 - 5.0 g/dL   AST 33 15 - 41 U/L   ALT 38 0 - 44 U/L   Alkaline Phosphatase 46 38 - 126 U/L   Total Bilirubin 0.6 0.3 - 1.2 mg/dL   GFR, Estimated >76 >19 mL/min    Comment: (NOTE) Calculated using the CKD-EPI Creatinine Equation (2021)    Anion gap 9 5 - 15    Comment: Performed at [facility], [address] [place], [place] [address]  Ethanol     Status: None   Collection Time: 09/02/22 12:14 AM  Result Value Ref Range   Alcohol, Ethyl (B) <10 <10 mg/dL    Comment: (NOTE) Lowest detectable limit for serum alcohol is 10 mg/dL.  For medical purposes only. Performed at [facility], [address], [place], [place] [address]   cbc     Status: Abnormal   Collection Time: 09/02/22 12:14 AM  Result Value Ref Range   WBC 7.4 4.0 - 10.5 K/uL   RBC 4.62 4.22 - 5.81 MIL/uL   Hemoglobin 12.8 (L) 13.0 - 17.0 g/dL   HCT 58.0 99.8 - 33.8 %   MCV 85.5 80.0 - 100.0 fL   MCH 27.7 26.0 - 34.0 pg   MCHC 32.4 30.0 - 36.0 g/dL   RDW 25.0 53.9 - 76.7 %   Platelets 274 150 - 400 K/uL   nRBC 0.0 0.0 - 0.2 %    Comment: Performed at [facility], [address] [place], [place] [address]  SARS Coronavirus 2 by RT PCR (hospital order, performed in [facility] hospital lab) *cepheid single result test* Anterior Nasal Swab     Status: None   Collection Time: 09/02/22  1:29 AM   Specimen: Anterior Nasal Swab  Result Value Ref Range   SARS Coronavirus 2 by RT PCR NEGATIVE  NEGATIVE    Comment: (NOTE) SARS-CoV-2 target nucleic acids are NOT DETECTED.  The SARS-CoV-2 RNA is generally detectable in upper and lower respiratory specimens during the acute phase of infection. The lowest concentration of SARS-CoV-2 viral copies this assay can detect is 250 copies / mL. A negative result does not preclude SARS-CoV-2 infection and should not be used as the sole basis for treatment or other patient management decisions.  A negative result may occur with improper specimen collection / handling, submission of specimen other than nasopharyngeal swab, presence of viral mutation(s) within the areas targeted by this assay, and inadequate number of viral copies (<250 copies / mL). A negative result must be combined with clinical observations, patient history, and epidemiological information.  Fact Sheet for Patients:   https://www.patel.info/  Fact Sheet for Healthcare Providers: https://hall.com/  This test is not yet approved or  cleared by the [place] FDA and has been authorized for detection and/or diagnosis of SARS-CoV-2 by FDA under an Emergency Use Authorization (EUA).  This EUA will remain in effect (meaning this test can be used) for the duration of the COVID-19 declaration under Section 564(b)(1) of the Act, 21 U.S.C. section 360bbb-3(b)(1), unless the authorization is terminated or revoked sooner.  Performed at [facility], [place] [address] [place], [place] [address]   Rapid urine drug screen (hospital performed)     Status: Abnormal   Collection Time: 09/02/22  4:40 PM  Result Value Ref Range    Opiates POSITIVE (A) NONE DETECTED   Cocaine NONE DETECTED NONE DETECTED   Benzodiazepines NONE DETECTED NONE DETECTED   Amphetamines POSITIVE (A) NONE DETECTED   Tetrahydrocannabinol NONE DETECTED NONE DETECTED   Barbiturates NONE DETECTED NONE DETECTED    Comment: (NOTE) DRUG SCREEN FOR MEDICAL PURPOSES ONLY.  IF CONFIRMATION IS NEEDED FOR ANY PURPOSE, NOTIFY LAB WITHIN 5 DAYS.  LOWEST DETECTABLE LIMITS FOR URINE DRUG SCREEN Drug Class                     Cutoff (ng/mL) Amphetamine and metabolites    1000 Barbiturate and metabolites    200 Benzodiazepine                 573 Tricyclics and metabolites     300 Opiates and metabolites        300 Cocaine and metabolites        300 THC                            50 Performed at [facility], [place] [address] [place], [place] [address]   Urinalysis, Complete w Microscopic Urine, Clean Catch     Status: None   Collection Time: 09/02/22  4:40 PM  Result Value Ref Range   Color, Urine YELLOW YELLOW   APPearance CLEAR CLEAR   Specific Gravity, Urine 1.021 1.005 - 1.030   pH 5.0 5.0 - 8.0   Glucose, UA NEGATIVE NEGATIVE mg/dL   Hgb urine dipstick NEGATIVE NEGATIVE   Bilirubin Urine NEGATIVE NEGATIVE   Ketones, ur NEGATIVE NEGATIVE mg/dL   Protein, ur NEGATIVE NEGATIVE mg/dL   Nitrite NEGATIVE NEGATIVE   Leukocytes,Ua NEGATIVE NEGATIVE   RBC / HPF 0-5 0 - 5 RBC/hpf   WBC, UA 0-5 0 - 5 WBC/hpf   Bacteria, UA NONE SEEN NONE SEEN   Mucus PRESENT    Ca Oxalate Crys, UA PRESENT     Comment: Performed  at [facility], [address], [place], [place] [address]  Acetaminophen level     Status: Abnormal   Collection Time: 09/03/22 12:14 AM  Result Value Ref Range   Acetaminophen (Tylenol), Serum <10 (L) 10 - 30 ug/mL    Comment: (NOTE) Therapeutic concentrations vary significantly. A range of 10-30 ug/mL  may be an effective concentration for many patients. However, some  are best treated at concentrations outside of  this range. Acetaminophen concentrations >150 ug/mL at 4 hours after ingestion  and >50 ug/mL at 12 hours after ingestion are often associated with  toxic reactions.  Performed at [facility], [address], [place], [place] [address]     Blood Alcohol level:  Lab Results  Component Value Date   [facility] <10 09/02/2022   ETH <10 08/12/2022   Psychiatric Specialty Exam:  Presentation  General Appearance:  Appropriate for Environment  Eye Contact: Fair  Speech: -- (improvement in clarity, slightly garbled)  Speech Volume: Normal  Handedness: Right   Mood and Affect  Mood: Euthymic  Affect: Flat   Thought Process  Thought Processes: Linear  Descriptions of Associations:Circumstantial  Orientation:Full (Time, Place and Person)  Thought Content:Logical  History of Schizophrenia/Schizoaffective disorder:Yes  Duration of Psychotic Symptoms:Greater than six months  Hallucinations:Hallucinations: Auditory Description of Auditory Hallucinations: demogratic party talking to him  Ideas of Reference:None  Suicidal Thoughts:Suicidal Thoughts: No  Homicidal Thoughts:Homicidal Thoughts: No   Sensorium  Memory: Immediate Fair; Recent Fair  Judgment: Fair  Insight: Fair   Art therapist  Concentration: Fair  Attention Span: Fair  Recall: Good  Fund of Knowledge: Good  Language: Good   Psychomotor Activity  Psychomotor Activity: Psychomotor Activity: Normal   Assets  Assets: Communication Skills; Housing; Physical Health; Resilience; Social Support   Sleep  Sleep: Sleep: Fair    Physical Exam: Physical Exam Neurological:     Mental Status: He is alert and oriented to person, place, and time.  Psychiatric:        Mood and Affect: Affect is flat.        Behavior: Behavior is cooperative.    Review of Systems  Psychiatric/Behavioral:  Positive for hallucinations and substance abuse.   All other systems  reviewed and are negative.  Blood pressure (!) 149/94, pulse 88, temperature 97.9 F (36.6 C), temperature source Oral, resp. rate 19, SpO2 95 %. There is no height or weight on file to calculate BMI.   Medical Decision Making: Pt case reviewed and discussed with Dr. Lucianne Muss. Pt continues to meet criteria for IVC and inpatient psychiatric treatment. EDP, RN, and LCSW notified of disposition. CSW will fax patient out if no bed availability at [facility].   Problem 1: AH Zyprexa 5 mg po added to take in the morning to help target hallucinations pt is still experiencing during the day  Eligha Bridegroom, NP 09/03/2022, 3:10 PM

## 2022-09-03 NOTE — ED Notes (Signed)
Cole Henderson Daughter [phone].

## 2022-09-04 ENCOUNTER — Inpatient Hospital Stay (HOSPITAL_COMMUNITY)
Admission: AD | Admit: 2022-09-04 | Discharge: 2022-09-10 | DRG: 885 | Disposition: A | Discharge status: Home / Self Care | Payer: Federal, State, Local not specified - Other | Source: Intra-hospital | Attending: Psychiatry | Admitting: Psychiatry

## 2022-09-04 ENCOUNTER — Encounter (HOSPITAL_COMMUNITY): Payer: Self-pay | Admitting: Nurse Practitioner

## 2022-09-04 ENCOUNTER — Other Ambulatory Visit: Payer: Self-pay

## 2022-09-04 DIAGNOSIS — K30 Functional dyspepsia: Secondary | ICD-10-CM | POA: Diagnosis present

## 2022-09-04 DIAGNOSIS — Z87820 Personal history of traumatic brain injury: Secondary | ICD-10-CM

## 2022-09-04 DIAGNOSIS — I10 Essential (primary) hypertension: Secondary | ICD-10-CM | POA: Diagnosis present

## 2022-09-04 DIAGNOSIS — Z91411 Personal history of adult psychological abuse: Secondary | ICD-10-CM

## 2022-09-04 DIAGNOSIS — F411 Generalized anxiety disorder: Secondary | ICD-10-CM

## 2022-09-04 DIAGNOSIS — G2401 Drug induced subacute dyskinesia: Secondary | ICD-10-CM

## 2022-09-04 DIAGNOSIS — G4700 Insomnia, unspecified: Secondary | ICD-10-CM

## 2022-09-04 DIAGNOSIS — Z91148 Patient's other noncompliance with medication regimen for other reason: Secondary | ICD-10-CM

## 2022-09-04 DIAGNOSIS — Z87828 Personal history of other (healed) physical injury and trauma: Secondary | ICD-10-CM | POA: Diagnosis not present

## 2022-09-04 DIAGNOSIS — F2089 Other schizophrenia: Secondary | ICD-10-CM | POA: Diagnosis not present

## 2022-09-04 DIAGNOSIS — F29 Unspecified psychosis not due to a substance or known physiological condition: Secondary | ICD-10-CM | POA: Diagnosis present

## 2022-09-04 DIAGNOSIS — Z818 Family history of other mental and behavioral disorders: Secondary | ICD-10-CM | POA: Diagnosis not present

## 2022-09-04 DIAGNOSIS — Z79899 Other long term (current) drug therapy: Secondary | ICD-10-CM | POA: Diagnosis not present

## 2022-09-04 DIAGNOSIS — F600 Paranoid personality disorder: Secondary | ICD-10-CM | POA: Diagnosis present

## 2022-09-04 DIAGNOSIS — F17210 Nicotine dependence, cigarettes, uncomplicated: Secondary | ICD-10-CM | POA: Diagnosis present

## 2022-09-04 DIAGNOSIS — F209 Schizophrenia, unspecified: Principal | ICD-10-CM | POA: Diagnosis present

## 2022-09-04 DIAGNOSIS — F200 Paranoid schizophrenia: Principal | ICD-10-CM | POA: Diagnosis present

## 2022-09-04 DIAGNOSIS — Z62810 Personal history of physical and sexual abuse in childhood: Secondary | ICD-10-CM

## 2022-09-04 DIAGNOSIS — R451 Restlessness and agitation: Secondary | ICD-10-CM | POA: Diagnosis present

## 2022-09-04 DIAGNOSIS — Z62811 Personal history of psychological abuse in childhood: Secondary | ICD-10-CM

## 2022-09-04 MED ORDER — NICOTINE 14 MG/24HR TD PT24
14.0000 mg | MEDICATED_PATCH | Freq: Every day | TRANSDERMAL | Status: DC
  Administered 2022-09-04: 14 mg via TRANSDERMAL
  Filled 2022-09-04: qty 1

## 2022-09-04 MED ORDER — TRIHEXYPHENIDYL HCL 5 MG PO TABS
5.0000 mg | ORAL_TABLET | Freq: Two times a day (BID) | ORAL | Status: DC
  Administered 2022-09-05: 5 mg via ORAL
  Filled 2022-09-04 (×3): qty 1

## 2022-09-04 MED ORDER — OLANZAPINE 5 MG PO TABS
5.0000 mg | ORAL_TABLET | Freq: Every day | ORAL | Status: DC
  Administered 2022-09-05: 5 mg via ORAL
  Filled 2022-09-04 (×2): qty 1

## 2022-09-04 MED ORDER — ALUM & MAG HYDROXIDE-SIMETH 200-200-20 MG/5ML PO SUSP: 30.0000 mL | ORAL | Status: DC | PRN

## 2022-09-04 MED ORDER — MAGNESIUM HYDROXIDE 400 MG/5ML PO SUSP
30.0000 mL | Freq: Every day | ORAL | Status: DC | PRN
  Administered 2022-09-08: 30 mL via ORAL
  Filled 2022-09-04: qty 30

## 2022-09-04 MED ORDER — OLANZAPINE 10 MG PO TABS: 10.0000 mg | ORAL_TABLET | Freq: Two times a day (BID) | ORAL | Status: DC | PRN

## 2022-09-04 MED ORDER — ACETAMINOPHEN 325 MG PO TABS
650.0000 mg | ORAL_TABLET | Freq: Four times a day (QID) | ORAL | Status: DC | PRN
  Filled 2022-09-04: qty 2

## 2022-09-04 MED ORDER — OLANZAPINE 10 MG PO TABS
10.0000 mg | ORAL_TABLET | Freq: Every day | ORAL | Status: DC
  Administered 2022-09-04: 10 mg via ORAL
  Filled 2022-09-04 (×3): qty 1

## 2022-09-04 MED ORDER — ACETAMINOPHEN 325 MG PO TABS
650.0000 mg | ORAL_TABLET | Freq: Four times a day (QID) | ORAL | Status: DC | PRN
  Administered 2022-09-04 – 2022-09-10 (×3): 650 mg via ORAL
  Filled 2022-09-04 (×3): qty 2

## 2022-09-04 MED ORDER — OLANZAPINE 10 MG IM SOLR: 10.0000 mg | Freq: Two times a day (BID) | INTRAMUSCULAR | Status: DC | PRN

## 2022-09-04 NOTE — ED Notes (Signed)
Breakfast at bedside.

## 2022-09-04 NOTE — Progress Notes (Signed)
Pt admitted IVC to room 508-1 at [facility].  On admission, pt is pleasant and cooperative.  He denies SI/HI/AVH.  Pt says "I was in prison for 1 year and got out in June.  I haven't been on the right medications since.  I was doing great on Prolixin and Artane."  Pt says that he was also in prison from 2005-2020 for robbing a bank in [place], [place].  Pt signed admission paperwork and verbalized plan of care.  Pt oriented to the unit and is eating dinner at this time.

## 2022-09-04 NOTE — ED Notes (Addendum)
Review of patient clipboard in purple zone indicates patient was IVC'd 09/01/2022, expiration of 09/08/2022.

## 2022-09-04 NOTE — ED Provider Notes (Signed)
Emergency Medicine Observation Re-evaluation Note  Cole Henderson is a 46 y.o. male with history of schizophrenia and methamphetamine abuse, seen on rounds today.  Pt initially presented to the ED for complaints of IVC Currently, the patient is sleeping.  Physical Exam  BP (!) 138/90   Pulse 90   Temp 98.4 F (36.9 C) (Oral)   Resp 18   SpO2 98%  Physical Exam General: Resting comfortably in stretcher asleep Lungs: No acute distress normal work of breathing Psych: Calm  ED Course / MDM  EKG:EKG Interpretation  Date/Time:  Saturday September 02 2022 00:11:52 EDT Ventricular Rate:  87 PR Interval:  150 QRS Duration: 86 QT Interval:  348 QTC Calculation: 418 R Axis:   54 Text Interpretation: Normal sinus rhythm Normal ECG When compared with ECG of 12-Aug-2022 14:15, No significant change was found Confirmed by Delora Fuel ([id number]) on 09/03/2022 5:38:18 AM  I have reviewed the labs performed to date as well as medications administered while in observation.  Recent changes in the last 24 hours include patient seen by psychiatric NP who feels that the patient needs continued IVC and inpatient treatment.  Added Zyprexa 5 mg in the morning to help with his hallucinations.  Plan  Current plan is placement for inpatient psychiatry.   Fransico Meadow, MD 09/04/22 [phone]

## 2022-09-04 NOTE — BHH Group Notes (Signed)
Adult Psychoeducational Group Note  Date:  09/04/2022 Time:  9:32 PM  Group Topic/Focus:  Wrap-Up Group:   The focus of this group is to help patients review their daily goal of treatment and discuss progress on daily workbooks.  Participation Level:  Active  Participation Quality:  Appropriate  Affect:  Appropriate  Cognitive:  Alert  Insight: Appropriate  Engagement in Group:  Engaged  Modes of Intervention:  Discussion  Additional Comments:  Patient attended and participated in the [place] group.  Annie Sable 09/04/2022, 9:32 PM

## 2022-09-04 NOTE — ED Notes (Signed)
Pt attempted to call daughter; left voicemail

## 2022-09-04 NOTE — ED Notes (Signed)
Pt ambulatory to and from restroom with steady gait; calm and cooperative at present; denies si/hi

## 2022-09-04 NOTE — ED Provider Notes (Signed)
[facility]  by Dr. Caswell Corwin.  They anticipate a bed to be ready after 1 PM.  Patient reevaluated in person and is doing well.  Does not have any questions about the transfer at this time.  Arranging for transport.   Fransico Meadow, MD 09/04/22 [phone]

## 2022-09-04 NOTE — Tx Team (Signed)
Initial Treatment Plan 09/04/2022 6:29 PM [name] ZOX:[id number]    PATIENT STRESSORS: Marital or family conflict   Medication change or noncompliance   Substance abuse     PATIENT STRENGTHS: Communication skills  Supportive family/friends    PATIENT IDENTIFIED PROBLEMS: Medication non-adherence  Substance use Meth  Schizophrenia                 DISCHARGE CRITERIA:  Ability to meet basic life and health needs Verbal commitment to aftercare and medication compliance  PRELIMINARY DISCHARGE PLAN: Outpatient therapy  PATIENT/FAMILY INVOLVEMENT: This treatment plan has been presented to and reviewed with the patient, Cole Henderson.  The patient has been given the opportunity to ask questions and make suggestions.  Luna Glasgow, RN 09/04/2022, 6:29 PM

## 2022-09-04 NOTE — Progress Notes (Signed)
Pt was accepted to [facility] 09/04/22; BED Assignment 508-1  Pt meets inpatient criteria per Vesta Mixer, NP  Attending Physician will be Dr. Caswell Corwin  Report can be called to: Adult unit: [phone]  Pt can arrive after 1pm  Care Team notified: Vesta Mixer, NP, Blima Singer Pflueger, RN, Margaretmary Eddy, MD, and Day shift [place], [place], [place] 09/04/2022 @ 3:00 PM

## 2022-09-05 DIAGNOSIS — F2089 Other schizophrenia: Secondary | ICD-10-CM | POA: Diagnosis not present

## 2022-09-05 DIAGNOSIS — G2401 Drug induced subacute dyskinesia: Secondary | ICD-10-CM

## 2022-09-05 DIAGNOSIS — F411 Generalized anxiety disorder: Secondary | ICD-10-CM

## 2022-09-05 DIAGNOSIS — G4700 Insomnia, unspecified: Secondary | ICD-10-CM

## 2022-09-05 LAB — RESP PANEL BY RT-PCR (FLU A&B, COVID) ARPGX2
Influenza A by PCR: NEGATIVE
Influenza B by PCR: NEGATIVE
SARS Coronavirus 2 by RT PCR: NEGATIVE

## 2022-09-05 MED ORDER — FLUPHENAZINE HCL 5 MG PO TABS
5.0000 mg | ORAL_TABLET | Freq: Every day | ORAL | Status: DC
  Administered 2022-09-05 – 2022-09-06 (×2): 5 mg via ORAL
  Filled 2022-09-05 (×5): qty 1

## 2022-09-05 MED ORDER — SERTRALINE HCL 100 MG PO TABS
100.0000 mg | ORAL_TABLET | Freq: Every day | ORAL | Status: DC
  Administered 2022-09-06 – 2022-09-10 (×5): 100 mg via ORAL
  Filled 2022-09-05 (×7): qty 1

## 2022-09-05 MED ORDER — LORAZEPAM 1 MG PO TABS: 1.0000 mg | ORAL_TABLET | Freq: Four times a day (QID) | ORAL | Status: DC | PRN

## 2022-09-05 MED ORDER — LORAZEPAM 2 MG/ML IJ SOLN: 1.0000 mg | Freq: Four times a day (QID) | INTRAMUSCULAR | Status: DC | PRN

## 2022-09-05 MED ORDER — TRAZODONE HCL 50 MG PO TABS
50.0000 mg | ORAL_TABLET | Freq: Every evening | ORAL | Status: DC | PRN
  Administered 2022-09-06 – 2022-09-09 (×3): 50 mg via ORAL
  Filled 2022-09-05 (×3): qty 1

## 2022-09-05 MED ORDER — TRIHEXYPHENIDYL HCL 5 MG PO TABS
5.0000 mg | ORAL_TABLET | Freq: Three times a day (TID) | ORAL | Status: DC
  Administered 2022-09-05 – 2022-09-10 (×15): 5 mg via ORAL
  Filled 2022-09-05 (×19): qty 1

## 2022-09-05 MED ORDER — HYDROXYZINE HCL 25 MG PO TABS
25.0000 mg | ORAL_TABLET | Freq: Four times a day (QID) | ORAL | Status: DC | PRN
  Administered 2022-09-09 – 2022-09-10 (×2): 25 mg via ORAL
  Filled 2022-09-05 (×3): qty 1

## 2022-09-05 MED ORDER — DIPHENHYDRAMINE HCL 25 MG PO CAPS
50.0000 mg | ORAL_CAPSULE | Freq: Once | ORAL | Status: AC
  Administered 2022-09-05: 50 mg via ORAL
  Filled 2022-09-05 (×2): qty 2

## 2022-09-05 MED ORDER — DIPHENHYDRAMINE HCL 25 MG PO CAPS: 50.0000 mg | ORAL_CAPSULE | Freq: Four times a day (QID) | ORAL | Status: DC | PRN

## 2022-09-05 MED ORDER — DIPHENHYDRAMINE HCL 50 MG/ML IJ SOLN: 50.0000 mg | Freq: Four times a day (QID) | INTRAMUSCULAR | Status: DC | PRN

## 2022-09-05 MED ORDER — SERTRALINE HCL 50 MG PO TABS
50.0000 mg | ORAL_TABLET | Freq: Every day | ORAL | Status: AC
  Administered 2022-09-05: 50 mg via ORAL
  Filled 2022-09-05: qty 1

## 2022-09-05 NOTE — Group Note (Signed)
Recreation Therapy Group Note   Group Topic:Communication  Group Date: 09/05/2022 Start Time: 1000 End Time: 1038 Facilitators: Charlyne Robertshaw-McCall, LRT,CTRS Location: [address] Dayroom   Goal Area(s) Addresses:  Patient will effectively listen to complete activity.  Patient will identify communication skills used to make activity successful.  Patient will identify how skills used during activity can be used to reach post d/c goals.      Group Description: Geometric Drawings.  Three volunteers from the peer group will be shown an abstract picture with a particular arrangement of geometrical shapes.  Each round, one 'speaker' will describe the pattern, as accurately as possible without revealing the image to the group.  The remaining group members will listen and draw the picture to reflect how it is described to them. Patients with the role of 'listener' cannot ask clarifying questions but, may request that the speaker repeat a direction. Once the drawings are complete, the presenter will show the rest of the group the picture and compare how close each person came to drawing the picture. LRT will facilitate a post-activity discussion regarding effective communication and the importance of planning, listening, and asking for clarification in daily interactions with others.   Affect/Mood: Appropriate   Participation Level: Moderate   Participation Quality: Independent   Behavior: Appropriate   Speech/Thought Process: Focused   Insight: Good   Judgement: Good   Modes of Intervention: Activity   Patient Response to Interventions:  Attentive   Education Outcome:  Acknowledges education and In group clarification offered    Clinical Observations/Individualized Feedback: Pt was appropriate and exhibited minimal participation.  Pt was able to follow along during the activity when focused.  Pt was appropriate and attentive while in group session.    Plan: Continue to engage  patient in RT group sessions 2-3x/week.   Niemah Schwebke-McCall, LRT,CTRS  09/05/2022 12:03 PM

## 2022-09-05 NOTE — Progress Notes (Signed)
Patient was seen face -to-face in his room. Pleasant on approach. Calm and cooperative and denying SI/HI/AVH. Taking medications as scheduled and had no major concerns. Encouragements and support provided.

## 2022-09-05 NOTE — Progress Notes (Signed)
   09/05/22 0000  Psych Admission Type (Psych Patients Only)  Admission Status Involuntary  Psychosocial Assessment  Patient Complaints Anxiety  Eye Contact Fair  Facial Expression Animated  Affect Appropriate to circumstance  Speech Slurred  Interaction Assertive  Motor Activity Slow  Appearance/Hygiene In scrubs;Body odor  Behavior Characteristics Cooperative;Appropriate to situation  Mood Pleasant  Thought Process  Coherency WDL  Content WDL  Delusions None reported or observed  Perception WDL  Hallucination None reported or observed  Judgment Poor  Confusion None  Danger to Self  Current suicidal ideation? Denies  Danger to Others  Danger to Others None reported or observed
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
## 2022-09-05 NOTE — [facility]HH Suicide Risk Assessment (Signed)
Suicide Risk Assessment  Admission Assessment    [facility] Admission Suicide Risk Assessment   Nursing information obtained from:  Patient Demographic factors:  Male, Low socioeconomic status, Unemployed Current Mental Status:  NA Loss Factors:  NA Historical Factors:  Family history of suicide, Impulsivity Risk Reduction Factors:  Living with another person, especially a relative  Total Time spent with patient: 1.5 hours Principal Problem: Schizophrenia ([place]) Diagnosis:  Principal Problem:   Schizophrenia ([place]) Active Problems:   Insomnia   Anxiety state   Tardive dyskinesia  History of Present Illness: Cole Henderson is a 46 yo Caucasian male with a prior mental health history of Schizophrenia, paranoid personality d/o, MDD and methamphetamine abuse, who was taken to the [facility] ER by the [facility] dept under involuntary status for worsening psychosis & property destruction in the context of Methamphetamine abuse and medication non compliance. Pt was transferred to this [facility] for treatment of his psychosis and mental health stabilization.   Continued Clinical Symptoms: +AVH, paranoia, delusional thinking along with depressive symptoms. Current symptoms render pt at high risk of danger to self and others, and continuous hospitalization is necessary to treat and stabilize symptoms.  Alcohol Use Disorder Identification Test Final Score (AUDIT): 0 The "Alcohol Use Disorders Identification Test", Guidelines for Use in Primary Care, Second Edition.  World Pharmacologist [facility]). Score between 0-7:  no or low risk or alcohol related problems. Score between 8-15:  moderate risk of alcohol related problems. Score between 16-19:  high risk of alcohol related problems. Score 20 or above:  warrants further diagnostic evaluation for alcohol dependence and treatment.  CLINICAL FACTORS:   Schizophrenia:   Depressive state  Musculoskeletal: Strength & Muscle Tone: within normal  limits Gait & Station: normal Patient leans: N/A  Psychiatric Specialty Exam:  Presentation  General Appearance:  Disheveled  Eye Contact: Good  Speech: Garbled  Speech Volume: Normal  Handedness: Right   Mood and Affect  Mood: Depressed  Affect: Congruent   Thought Process  Thought Processes: Coherent  Descriptions of Associations:Intact  Orientation:Full (Time, Place and Person)  Thought Content:Illogical  History of Schizophrenia/Schizoaffective disorder:Yes  Duration of Psychotic Symptoms:Greater than six months  Hallucinations:Hallucinations: Auditory; Visual Description of Auditory Hallucinations: people trying to taunt him and talking about politics  Ideas of Reference:Paranoia; Delusions  Suicidal Thoughts:Suicidal Thoughts: No  Homicidal Thoughts:Homicidal Thoughts: No   Sensorium  Memory: Immediate Good  Judgment: Fair  Insight: Fair   Materials engineer: Fair  Attention Span: Fair  Recall: [place] of Knowledge: Fair  Language: Fair   Psychomotor Activity  Psychomotor Activity: Psychomotor Activity: Extrapyramidal Side Effects (EPS); Restlessness Extrapyramidal Side Effects (EPS): Tardive Dyskinesia AIMS Completed?: Yes   Assets  Assets: Communication Skills; Resilience   Sleep  Sleep: Sleep: Poor    Physical Exam: Physical Exam Constitutional:      Appearance: Normal appearance.  HENT:     Head: Normocephalic.     Nose: Nose normal. No congestion or rhinorrhea.  Eyes:     Pupils: Pupils are equal, round, and reactive to light.  Pulmonary:     Effort: Pulmonary effort is normal.  Musculoskeletal:        General: Normal range of motion.     Cervical back: Normal range of motion.  Neurological:     Mental Status: He is alert and oriented to person, place, and time.    Review of Systems  Constitutional: Negative.   HENT: Negative.    Eyes: Negative.  Respiratory:  Negative.    Cardiovascular: Negative.   Gastrointestinal: Negative.   Genitourinary: Negative.   Musculoskeletal: Negative.   Skin: Negative.   Neurological: Negative.   Psychiatric/Behavioral:  Positive for depression, hallucinations and substance abuse. Negative for memory loss and suicidal ideas. The patient is nervous/anxious and has insomnia.    Blood pressure 119/86, pulse (!) 110, temperature 98.1 F (36.7 C), temperature source Oral, resp. rate 16, height 6' (1.829 m), weight 94.1 kg, SpO2 98 %. Body mass index is 28.13 kg/m.   COGNITIVE FEATURES THAT CONTRIBUTE TO RISK:  None    SUICIDE RISK:   Moderate:  Frequent suicidal ideation with limited intensity, and duration, some specificity in terms of plans, no associated intent, good self-control, limited dysphoria/symptomatology, some risk factors present, and identifiable protective factors, including available and accessible social support.  Treatment Plan Summary: Daily contact with patient to assess and evaluate symptoms and progress in treatment and Medication management   Observation Level/Precautions:  15 minute checks  Laboratory:  Labs reviewed   Psychotherapy:  Unit Group sessions  Medications:  See [facility]  Consultations:  To be determined   Discharge Concerns:  Safety, medication compliance, mood stability  Estimated LOS: 5-7 days  Other:  N/A    PLAN Safety and Monitoring: Voluntary admission to inpatient psychiatric unit for safety, stabilization and treatment Daily contact with patient to assess and evaluate symptoms and progress in treatment Patient's case to be discussed in multi-disciplinary team meeting Observation Level : q15 minute checks Vital signs: q12 hours Precautions: Safety   Long Term Goal(s): Improvement in symptoms so as ready for discharge   Short Term Goals: Ability to identify changes in lifestyle to reduce recurrence of condition will improve, Ability to disclose and discuss suicidal  ideas, Ability to demonstrate self-control will improve, Ability to identify and develop effective coping behaviors will improve, Compliance with prescribed medications will improve, and Ability to identify triggers associated with substance abuse/mental health issues will improve   Labs Reviewed independently today, 09/05/2022: CMP WNL. CBC WNL. Baseline UA WNL. EKG NSR with QTC-418. Orders for lipid panel, hemoglobin A1C, TSH, Vitamin D & B12 placed.   Diagnoses:  Principal Problem:   Schizophrenia (H[facility]) Active Problems:   Insomnia   Anxiety state   Tardive dyskinesia   Schizophrenia -Start Prolixin 5 mg daily for psychosis   Depressive symptoms -Start Zoloft 50 mg today (10/3), and increase to 100 mg tomorrow (10/4)   TD -Continue Artane 5 mg and increase to TID before meals   Anxiety -Continue Hydroxyzine 25 mg every 6 hours PRN   Insomnia -Start Trazodone 50 mg nightly PRN for insomnia   Agitation Protocol -Start Benadryl 50 mg PO/IM Q 6 H PRN for agitation -Start Ativan 1 mg PO/IM Q 6 H PRN for agitation   Other PRNS -Continue Tylenol 650 mg every 6 hours PRN for mild pain -Continue Maalox 30 mg every 4 hrs PRN for indigestion -Continue Milk of Magnesia as needed every 6 hrs for constipation   Discharge Planning: Social work and case management to assist with discharge planning and identification of hospital follow-up needs prior to discharge Estimated LOS: 5-7 days Discharge Concerns: Need to establish a safety plan; Medication compliance and effectiveness Discharge Goals: Return home with outpatient referrals for mental health follow-up including medication management/psychotherapy  I certify that inpatient services furnished can reasonably be expected to improve the patient's condition.   Starleen Blue, NP 09/05/2022, 3:30 PM

## 2022-09-05 NOTE — Progress Notes (Signed)
Patient denies SI/HI/VH and pain, but endorses AH. Patient states that he is happy with his ordered medications, but is requesting PRN for drooling, hand tremors, slurred speech and AH. Patient reports good sleep last night ("I slept like a rock.") Ordered morning meds were administered without incident or adverse effects. Patient offered reassurance. Patient encouraged to come to staff with any concerns or need and verbalized understanding. Safety checks remain in place and patient remains safe at this time.

## 2022-09-05 NOTE — Progress Notes (Signed)
Recreation Therapy Notes  INPATIENT RECREATION THERAPY ASSESSMENT  Patient Details Name: Cole Henderson MRN: [id number] DOB: 11/16/1976 Today's Date: 09/05/2022       Information Obtained From: Patient  Able to Participate in Assessment/Interview: Yes  Patient Presentation: Alert  Reason for Admission (Per Patient): Med Non-Compliance  Patient Stressors: Other (Comment) (Homeless; Probation)  Coping Skills:   Building control surveyor, TV, Music, Exercise, Substance Abuse, Talk, Avoidance, Read  Leisure Interests (2+):  Music - Listen, Individual - Other (Comment) (watch Youtube and old wrestling videos)  Frequency of Recreation/Participation: Other (Comment) (daily)  Awareness of Community Resources:  Yes  Community Resources:  Park, Art therapist, Patent examiner  Current Use: No  If no, Barriers?: Social (Pt stated he doesn't like crowds.)  Expressed Interest in [place]: No  [employer] of Residence:  Investment banker, corporate  Patient Main Form of Transportation: Walk  Patient Strengths:  "being a good grandpa"  Patient Identified Areas of Improvement:  none  Patient Goal for Hospitalization:  "get back on the right meds"  Current SI (including self-harm):  No  Current HI:  No  Current AVH: Yes (

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
## 2022-09-05 NOTE — [facility] Group Notes (Signed)
Adult Psychoeducational Group Note  Date:  09/05/2022 Time:  8:20 PM  Group Topic/Focus:  Wrap-Up Group:   The focus of this group is to help patients review their daily goal of treatment and discuss progress on daily workbooks.  Participation Level:  Active  Participation Quality:  Attentive  Affect:  Appropriate  Cognitive:  Appropriate  Insight: Appropriate  Engagement in Group:  Engaged  Modes of Intervention:  Discussion  Additional Comments:  Patient attended and participated in the [place] group.  Annie Sable 09/05/2022, 8:20 PM

## 2022-09-06 ENCOUNTER — Encounter (HOSPITAL_COMMUNITY): Payer: Self-pay

## 2022-09-06 DIAGNOSIS — F2089 Other schizophrenia: Secondary | ICD-10-CM | POA: Diagnosis not present

## 2022-09-06 LAB — HEMOGLOBIN A1C
Hgb A1c MFr Bld: 5.5 % (ref 4.8–5.6)
Mean Plasma Glucose: 111.15 mg/dL

## 2022-09-06 LAB — LIPID PANEL
Cholesterol: 163 mg/dL (ref 0–200)
HDL: 52 mg/dL (ref >40)
LDL Cholesterol: 77 mg/dL (ref 0–99)
Total CHOL/HDL Ratio: 3.1 RATIO
Triglycerides: 172 mg/dL — ABNORMAL HIGH (ref <150)
VLDL: 34 mg/dL (ref 0–40)

## 2022-09-06 LAB — TSH: TSH: 1.653 u[IU]/mL (ref 0.350–4.500)

## 2022-09-06 LAB — VITAMIN B12: Vitamin B-12: 290 pg/mL (ref 180–914)

## 2022-09-06 MED ORDER — GABAPENTIN 100 MG PO CAPS
100.0000 mg | ORAL_CAPSULE | Freq: Three times a day (TID) | ORAL | Status: DC
  Administered 2022-09-06 – 2022-09-08 (×6): 100 mg via ORAL
  Filled 2022-09-06 (×11): qty 1

## 2022-09-06 MED ORDER — FLUPHENAZINE HCL 5 MG PO TABS
5.0000 mg | ORAL_TABLET | Freq: Two times a day (BID) | ORAL | Status: DC
  Administered 2022-09-06 – 2022-09-08 (×4): 5 mg via ORAL
  Filled 2022-09-06 (×8): qty 1

## 2022-09-06 MED ORDER — NICOTINE 21 MG/24HR TD PT24
21.0000 mg | MEDICATED_PATCH | Freq: Every day | TRANSDERMAL | Status: DC
  Administered 2022-09-06 – 2022-09-10 (×5): 21 mg via TRANSDERMAL
  Filled 2022-09-06 (×7): qty 1

## 2022-09-06 NOTE — BH IP Treatment Plan (Signed)
Interdisciplinary Treatment and Diagnostic Plan Update  09/06/2022 Time of Session: [place] MRN: [id number]  Principal Diagnosis: Schizophrenia [facility])  Secondary Diagnoses: Principal Problem:   Schizophrenia ([place]) Active Problems:   Insomnia   Anxiety state   Tardive dyskinesia   Current Medications:  Current Facility-Administered Medications  Medication Dose Route Frequency Provider Last Rate Last Admin   acetaminophen (TYLENOL) tablet 650 mg  650 mg Oral Q6H PRN Vesta Mixer, NP   650 mg at 09/04/22 2050   alum & mag hydroxide-simeth (MAALOX/MYLANTA) 200-200-20 MG/5ML suspension 30 mL  30 mL Oral Q4H PRN Vesta Mixer, NP       diphenhydrAMINE (BENADRYL) injection 50 mg  50 mg Intramuscular Q6H PRN Nicholes Rough, NP       Or   diphenhydrAMINE (BENADRYL) capsule 50 mg  50 mg Oral Q6H PRN Nicholes Rough, NP       fluPHENAZine (PROLIXIN) tablet 5 mg  5 mg Oral Daily Nicholes Rough, NP   5 mg at 09/06/22 0802   hydrOXYzine (ATARAX) tablet 25 mg  25 mg Oral Q6H PRN Nkwenti, Tamela Oddi, NP       LORazepam (ATIVAN) tablet 1 mg  1 mg Oral Q6H PRN Nkwenti, Doris, NP       Or   LORazepam (ATIVAN) injection 1 mg  1 mg Intravenous Q6H PRN Nkwenti, Doris, NP       magnesium hydroxide (MILK OF MAGNESIA) suspension 30 mL  30 mL Oral Daily PRN Vesta Mixer, NP       sertraline (ZOLOFT) tablet 100 mg  100 mg Oral Daily Nicholes Rough, NP   100 mg at 09/06/22 0802   traZODone (DESYREL) tablet 50 mg  50 mg Oral QHS PRN Nicholes Rough, NP       trihexyphenidyl (ARTANE) tablet 5 mg  5 mg Oral TID Nicholes Rough, NP   5 mg at 09/06/22 1206   PTA Medications: Medications Prior to Admission  Medication Sig Dispense Refill Last Dose   ARIPiprazole ER (ABILIFY MAINTENA) 400 MG SRER injection Inject 2 mLs (400 mg total) into the muscle every 28 (twenty-eight) days. (Patient not taking: Reported on 09/02/2022) 1 each 11    OLANZapine (ZYPREXA) 5 MG tablet Take 1 tablet (5 mg total) by  mouth at bedtime for 7 days. (Patient not taking: Reported on 09/02/2022)      trihexyphenidyl (ARTANE) 5 MG tablet Take 1 tablet (5 mg total) by mouth 2 (two) times daily with a meal. 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   Physician Treatment Plan for Primary Diagnosis: Schizophrenia ([place]) Long Term Goal(s): Improvement in symptoms so as ready for discharge   Short Term Goals: Ability to identify changes in lifestyle to reduce recurrence of condition will improve Ability to disclose and discuss suicidal ideas Ability to demonstrate self-control will improve Ability to identify and develop effective coping behaviors will improve Compliance with prescribed medications will improve Ability to identify triggers associated with substance abuse/mental health issues will improve  Medication Management: Evaluate patient's response, side effects, and tolerance of medication regimen.  Therapeutic Interventions: 1 to 1 sessions, Unit Group sessions and Medication administration.  Evaluation of Outcomes: Progressing  Physician Treatment Plan for Secondary  Diagnosis: Principal Problem:   Schizophrenia ([facility]) Active Problems:   Insomnia   Anxiety state   Tardive dyskinesia  Long Term Goal(s): Improvement in symptoms so as ready for discharge   Short Term Goals: Ability to identify changes in lifestyle to reduce recurrence of condition will improve Ability to disclose and discuss suicidal ideas Ability to demonstrate self-control will improve Ability to identify and develop effective coping behaviors will improve Compliance with prescribed medications  will improve Ability to identify triggers associated with substance abuse/mental health issues will improve     Medication Management: Evaluate patient's response, side effects, and tolerance of medication regimen.  Therapeutic Interventions: 1 to 1 sessions, Unit Group sessions and Medication administration.  Evaluation of Outcomes: Not Progressing   RN Treatment Plan for Primary Diagnosis: Schizophrenia ([facility]) Long Term Goal(s): Knowledge of disease and therapeutic regimen to maintain health will improve  Short Term Goals: Ability to remain free from injury will improve, Ability to verbalize frustration and anger appropriately will improve, Ability to demonstrate self-control, Ability to participate in decision making will improve, Ability to verbalize feelings will improve, Ability to disclose and discuss suicidal ideas, Ability to identify and develop effective coping behaviors will improve, and Compliance with prescribed medications will improve  Medication Management: RN will administer medications as ordered by provider, will assess and evaluate patient's response and provide education to patient for prescribed medication. RN will report any adverse and/or side effects to prescribing provider.  Therapeutic Interventions: 1 on 1 counseling sessions, Psychoeducation, Medication administration, Evaluate responses to treatment, Monitor vital signs and CBGs as ordered, Perform/monitor CIWA, COWS, AIMS and Fall Risk screenings as ordered, Perform wound care treatments as ordered.  Evaluation of Outcomes: Progressing   LCSW Treatment Plan for Primary Diagnosis: Schizophrenia (HCC) Long Term Goal(s): Safe transition to appropriate next level of care at discharge, Engage patient in therapeutic group addressing interpersonal concerns.  Short Term Goals: Engage patient in aftercare planning with referrals and resources, Increase social support, Increase ability to appropriately verbalize  feelings, Increase emotional regulation, Facilitate acceptance of mental health diagnosis and concerns, Facilitate patient progression through stages of change regarding substance use diagnoses and concerns, Identify triggers associated with mental health/substance abuse issues, and Increase skills for wellness and recovery  Therapeutic Interventions: Assess for all discharge needs, 1 to 1 time with Social worker, Explore available resources and support systems, Assess for adequacy in community support network, Educate family and significant other(s) on suicide prevention, Complete Psychosocial Assessment, Interpersonal group therapy.  Evaluation of Outcomes: Progressing   Progress in Treatment: Attending groups: Yes. Participating in groups: Yes. Taking medication as prescribed: Yes. Toleration medication: Yes. Family/Significant other contact made: No, will contact:  CSW will obtain consent to reach family/friend.  Patient understands diagnosis: No. Discussing patient identified problems/goals with staff: Yes. Medical problems stabilized or resolved: Yes. Denies suicidal/homicidal ideation: No. Issues/concerns per patient self-inventory: Yes. Other: none  New problem(s) identified: No, Describe:  none  New Short Term/Long Term Goal(s): Patient to work towards detox, elimination of symptoms of psychosis, medication management for mood stabilization; elimination of SI thoughts; development of comprehensive mental wellness plan.  Patient Goals: Patient states their goal for treatment is to "get back on the right meds."  Discharge Plan or Barriers: No psychosocial barriers identified at this time, patient to return to place of residence when appropriate for discharge.   Reason for Continuation of Hospitalization: Other; describe psychosis   Estimated Length of Stay: 1-7 days   Scribe for Treatment Team:  Corky Crafts, LCSWA 09/06/2022 12:59 PM

## 2022-09-06 NOTE — BHH Counselor (Signed)
BHH/BMU LCSW Progress Note   09/06/2022    9:37 AM  Cole Henderson   [id number]   Type of Contact and Topic:  Release of Information for PO officer and SLA contact information  CSW met with patient at patient request to sign release of information of PO officer.  Patient consented for [place] to send records to PO officer.  Patient also expressed interest in sober living options and CSW provided contact information for sober living of [place]. Patient agreed to give them a call.     Signed:  Riki Altes MSW, LCSW,  LCAS 09/06/2022 9:37 AM

## 2022-09-06 NOTE — Group Note (Unsigned)
LCSW Group Therapy Note  Group Date: 09/06/2022 Start Time: 1300 End Time: 1345   Type of Therapy and Topic:  Group Therapy: Anger Cues and Responses  Participation Level:  {BHH PARTICIPATION LEVEL:22264}   Description of Group:   In this group, patients learned how to recognize the physical, cognitive, emotional, and behavioral responses they have to anger-provoking situations.  They identified a recent time they became angry and how they reacted.  They analyzed how their reaction was possibly beneficial and how it was possibly unhelpful.  The group discussed a variety of healthier coping skills that could help with such a situation in the future.  Focus was placed on how helpful it is to recognize the underlying emotions to our anger, because working on those can lead to a more permanent solution as well as our ability to focus on the important rather than the urgent.  Therapeutic Goals: 1. Patients will remember their last incident of anger and how they felt emotionally and physically, what their thoughts were at the time, and how they behaved. 2. Patients will identify how their behavior at that time worked for them, as well as how it worked against them. 3. Patients will explore possible new behaviors to use in future anger situations. 4. Patients will learn that anger itself is normal and cannot be eliminated, and that healthier reactions can assist with resolving conflict rather than worsening situations.  Summary of Patient Progress:  *** was active during the group. ***he shared a recent occurrence wherein feeling *** led to anger. ***he demonstrated *** insight into the subject matter, was respectful of peers, and participated throughout the entire session.  Therapeutic Modalities:   Cognitive Behavioral Therapy    Lucilla Petrenko S Mahdi Frye, LCSWA 09/06/2022  2:59 PM    

## 2022-09-06 NOTE — Progress Notes (Signed)
Adult Psychoeducational Group Note  Date:  09/06/2022 Time:  1:02 PM  Group Topic/Focus:  Orientation:   The focus of this group is to educate the patient on the purpose and policies of crisis stabilization and provide a format to answer questions about their admission.  The group details unit policies and expectations of patients while admitted.  Participation Level:  Active  Participation Quality:  Appropriate  Affect:  Appropriate  Cognitive:  Appropriate  Insight: Appropriate  Engagement in Group:  Engaged  Modes of Intervention:  Discussion  Additional Comments:  Patient attended morning orientation/goal setting group and participated.   [name] W Harini Dearmond [id number]/08/9356, 1:02 PM

## 2022-09-06 NOTE — Progress Notes (Signed)
Blood work not obtained this morning after 2 sticks.

## 2022-09-06 NOTE — Progress Notes (Signed)
   09/06/22 1000  Psych Admission Type (Psych Patients Only)  Admission Status Involuntary  Psychosocial Assessment  Patient Complaints Anxiety  Eye Contact Fair  Facial Expression Animated  Affect Appropriate to circumstance  Speech Slurred  Interaction Assertive  Motor Activity Slow  Appearance/Hygiene In scrubs  Behavior Characteristics Cooperative;Appropriate to situation  Mood Pleasant  Thought Process  Coherency WDL  Content WDL  Delusions None reported or observed  Perception WDL  Hallucination None reported or observed  Judgment Poor  Confusion None  Danger to Self  Current suicidal ideation? Denies  Agreement Not to Harm Self Yes  Description of Agreement Verbal  Danger to Others  Danger to Others None reported or observed

## 2022-09-06 NOTE — Progress Notes (Signed)
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 Grima, NP        Or   LORazepam (ATIVAN) injection 1 mg  1 mg Intravenous Q6H PRN Jashad Depaula, NP       magnesium hydroxide (MILK OF MAGNESIA) suspension 30 mL  30 mL Oral Daily PRN Eligha Bridegroom, NP       sertraline (ZOLOFT) tablet 50 mg  50 mg Oral Daily Evalee Gerard, NP        Followed by   Melene Muller ON 09/06/2022] sertraline (ZOLOFT) tablet 100 mg  100 mg Oral Daily Jakyri Brunkhorst, NP       traZODone (DESYREL) tablet 50 mg  50 mg Oral QHS PRN Aseem Sessums, NP       trihexyphenidyl (ARTANE) tablet 5 mg  5 mg Oral TID Starleen Blue, NP        PTA Medications:        Medications Prior to Admission  Medication Sig Dispense Refill Last Dose   ARIPiprazole ER (ABILIFY MAINTENA) 400 MG SRER injection Inject 2 mLs (400 mg total) into the muscle every 28 (twenty-eight)  days. (Patient not taking: Reported on 09/02/2022) 1 each 11     OLANZapine (ZYPREXA) 5 MG tablet Take 1 tablet (5 mg total) by mouth at bedtime for 7 days. (Patient not taking: Reported on 09/02/2022)         trihexyphenidyl (ARTANE) 5 MG tablet Take 1 tablet (5 mg total) by mouth 2 (two) times daily with a meal. (Patient not taking: Reported on 09/02/2022) 60 tablet 0      Musculoskeletal: Strength & Muscle Tone: within normal limits Gait & Station: normal Patient leans: N/A Psychiatric Specialty Exam:   Presentation  General Appearance:  Disheveled   Eye Contact: Good   Speech: Garbled   Speech Volume: Normal   Handedness: Right     Mood and Affect  Mood: Depressed   Affect: Congruent     Thought Process  Thought Processes: Coherent   Duration of Psychotic Symptoms: Greater than six months   Past Diagnosis of Schizophrenia or Psychoactive disorder: Yes   Descriptions of Associations:Intact   Orientation:Full (Time, Place and Person)   Thought Content:Illogical   Hallucinations:Hallucinations: Auditory; Visual Description of Auditory Hallucinations: people trying to taunt him and talking about politics   Ideas of Reference:Paranoia; Delusions   Suicidal Thoughts:Suicidal Thoughts: No   Homicidal Thoughts:Homicidal Thoughts: No     Sensorium  Memory: Immediate Good   Judgment: Fair   Insight: Fair     Art therapist  Concentration: Fair   Attention Span: Fair   Recall: Fair  Fund of Knowledge: Fair   Language: Fair     Psychomotor Activity  Psychomotor Activity:Psychomotor Activity: Extrapyramidal Side Effects (EPS); Restlessness Extrapyramidal Side Effects (EPS): Tardive Dyskinesia AIMS Completed?: Yes AIMS: 14-Increased Artane to 5 mg TID   Assets  Assets: Communication Skills; Resilience   Sleep  Sleep:Sleep: Poor   Physical Exam: Physical Exam Constitutional:      Appearance: Normal appearance.  HENT:      Head: Normocephalic.     Nose: Nose normal. No congestion.  Eyes:     Pupils: Pupils are equal, round, and reactive to light.  Pulmonary:     Effort: Pulmonary effort is normal.  Musculoskeletal:        General: Normal range of motion.     Cervical back: Normal range of motion.  Neurological:     Mental Status: He is alert and oriented to person, place, and time.      Review of Systems  Constitutional: Negative.  Negative for fever.  HENT: Negative.    Eyes: Negative.   Respiratory: Negative.    Cardiovascular: Negative.   Gastrointestinal: Negative.   Genitourinary: Negative.   Musculoskeletal: Negative.   Skin: Negative.   Neurological: Negative.   Psychiatric/Behavioral:  Positive for depression, hallucinations and substance abuse. Negative for memory loss and suicidal ideas. The patient is nervous/anxious and has insomnia.     Blood pressure 119/86, pulse (!) 110, temperature 98.1 F (36.7 C), temperature source Oral, resp. rate 16, height 6' (1.829 m), weight 94.1 kg, SpO2 98 %. Body mass index is 28.13 kg/m.   Treatment Plan Summary: Daily contact with patient to assess and evaluate symptoms and progress in treatment and Medication management   Observation Level/Precautions:  15 minute checks  Laboratory:  Labs reviewed   Psychotherapy:  Unit Group sessions  Medications:  See [facility]  Consultations:  To be determined   Discharge Concerns:  Safety, medication compliance, mood stability  Estimated LOS: 5-7 days  Other:  N/A    PLAN Safety and Monitoring: Voluntary admission to inpatient psychiatric unit for safety, stabilization and treatment Daily contact with patient to assess and evaluate symptoms and progress in treatment Patient's case to be discussed in multi-disciplinary team meeting Observation Level : q15 minute checks Vital signs: q12 hours Precautions: Safety   Long Term Goal(s): Improvement in symptoms so as ready for discharge   Short Term Goals:  Ability to identify changes in lifestyle to reduce recurrence of condition will improve, Ability to disclose and discuss suicidal ideas, Ability to demonstrate self-control will improve, Ability to identify and develop effective coping behaviors will improve, Compliance with prescribed medications will improve, and Ability to identify triggers associated with substance abuse/mental health issues will improve   Labs Reviewed independently today, 09/06/2022: CMP WNL. CBC WNL. Baseline UA WNL. EKG NSR with QTC-418. Orders for lipid panel, hemoglobin A1C, TSH, Vitamin D & B12 placed and pending.   Diagnoses:  Principal Problem:   Schizophrenia (HCC) Active Problems:   Insomnia   Anxiety state   Tardive dyskinesia   Schizophrenia -Increase Prolixin to 5 mg BID for psychosis   Depressive symptoms - Continue Zoloft 100 mg daily   TD -Continue Artane 5 mg TID before meals   Anxiety -Continue Hydroxyzine 25 mg every 6 hours PRN -Start Gabapentin 100 mg TID for anxiety and neuropathy   Insomnia -Continue 50 mg nightly PRN for insomnia   Agitation Protocol -Continue Benadryl 50 mg PO/IM Q 6 H PRN for agitation -Continue Ativan 1 mg  PO/IM Q 6 H PRN for agitation   Other PRNS -Continue Tylenol 650 mg every 6 hours PRN for mild pain -Continue Maalox 30 mg every 4 hrs PRN for indigestion -Continue Milk of Magnesia as needed every 6 hrs for constipation   Discharge Planning: Social work and case management to assist with discharge planning and identification of hospital follow-up needs prior to discharge Estimated LOS: 5-7 days Discharge Concerns: Need to establish a safety plan; Medication compliance and effectiveness Discharge Goals: Return home with outpatient referrals for mental health follow-up including medication management/psychotherapy   I certify that inpatient services furnished can reasonably be expected to improve the patient's condition.          Nicholes Rough,  NP 09/06/2022, 3:25 PM

## 2022-09-06 NOTE — Group Note (Signed)
[facility] LCSW Group Therapy Note  Date/Time:  Type of Therapy and Topic:  Group Therapy:  Who Am I?  Self Esteem, Self-Actualization and Understanding Self.  Participation Level:  Active  Description of Group:    In this group patients will be asked to explore values, beliefs, truths, and morals as they relate to personal self.  Patients will be guided to discuss their thoughts, feelings, and behaviors related to what they identify as important to their true self. Patients will process together how values, beliefs and truths are connected to specific choices patients make every day. Each patient will be challenged to identify changes that they are motivated to make in order to improve self-esteem and self-actualization. This group will be process-oriented, with patients participating in exploration of their own experiences as well as giving and receiving support and challenge from other group members.  Therapeutic Goals: Patient will identify false beliefs that currently interfere with their self-esteem.  Patient will identify feelings, thought process, and behaviors related to self and will become aware of the uniqueness of themselves and of others.  Patient will be able to identify and verbalize values, morals, and beliefs as they relate to self. Patient will begin to learn how to build self-esteem/self-awareness by expressing what is important and unique to them personally.  Summary of Patient Progress: Patient participated appropriately in group.  He had good insight into group topic and discussed how lived experience about being incarcerated has shaped his values.  Patient states that his grandkids are his biggest motivators.  Patient was respectful to other peers and engaged in discussion offering and receiving feedback.              Therapeutic Modalities:   Cognitive Behavioral Therapy Solution Focused Therapy Motivational Interviewing Brief Therapy   Khalie Wince, LCSW,  [place] Social Worker  [facility]

## 2022-09-06 NOTE — Group Note (Signed)
Recreation Therapy Group Note   Group Topic:Coping Skills  Group Date: 09/06/2022 Start Time: 1003 End Time: 1049 Facilitators: Jamontae Thwaites-McCall, LRT,CTRS Location: [address] Day[address]   Goal Area(s) Addresses:  Patient will identify positive coping skills techniques. Patient will identify benefits of using coping skills post d/c.  Group Description:  Mind Map.  Patient was provided a blank template of a diagram with 32 blank boxes in a tiered system, branching from the center (similar to a bubble chart). LRT directed patients to label the middle of the diagram "Coping Skills" and consider 8 different sources in coping skills could be used.  LRT and patients recorded those sources (anger, frustration, hate, anxiety, A/V hallucinations, stress, depression and grief)  in the 2nd tier boxes closest to the center. Patients were to then come up with 3 effective coping skills address each identified area in the remaining boxes. LRT would right the answers on the board so patients could see what each other came up with.    Affect/Mood: Appropriate   Participation Level: Engaged   Participation Quality: Independent   Behavior: Appropriate   Speech/Thought Process: Focused   Insight: Good   Judgement: Good   Modes of Intervention: Worksheet   Patient Response to Interventions:  Engaged   Education Outcome:  Acknowledges education and In group clarification offered    Clinical Observations/Individualized Feedback: Pt was bright and clearer to understand. Pt was appropriate during group session and interacted with peers very well.  Pt identified some coping skills as meditation, comedy, music, RSA, positive self talk and personal contact.     Plan: Continue to engage patient in RT group sessions 2-3x/week.   Stephane Niemann-McCall, LRT,CTRS 09/06/2022 12:37 PM

## 2022-09-07 DIAGNOSIS — F200 Paranoid schizophrenia: Secondary | ICD-10-CM | POA: Diagnosis not present

## 2022-09-07 LAB — VITAMIN D 25 HYDROXY (VIT D DEFICIENCY, FRACTURES): Vit D, 25-Hydroxy: 33.57 ng/mL (ref 30–100)

## 2022-09-07 NOTE — Group Note (Signed)
Recreation Therapy Group Note   Group Topic:Problem Solving  Group Date: 09/07/2022 Start Time: 1000 End Time: 1035 Facilitators: Lucia Harm-McCall, LRT,CTRS Location: [address] Dayroom   Goal Area(s) Addresses:  Patient will effectively work with peer towards shared goal.  Patient will identify skills used to make activity successful.  Patient will share challenges and verbalize solution-driven approaches used. Patient will identify how skills used during activity can be used to reach post d/c goals.   Group Description: [employer]. Patients were provided the following materials: 5 drinking straws, 5 rubber bands, 5 paper clips, 2 index cards and 2 drinking cups. Using the provided materials patients were asked to build a launching mechanism to launch a ping pong ball across the room, approximately 10 feet. Patients were divided into teams of 3-5. Instructions required all materials be incorporated into the device, functionality of items left to the peer group's discretion.   Affect/Mood: Appropriate   Participation Level: Minimal   Participation Quality: Independent   Behavior: Appropriate   Speech/Thought Process: Coherent   Insight: Good   Judgement: Good   Modes of Intervention: STEM Activity   Patient Response to Interventions:  Attentive   Education Outcome:  Acknowledges education and In group clarification offered    Clinical Observations/Individualized Feedback: Pt was bright and appropriate.  Pt looked at the supplies and attempted to come up with something.  Pt decided he didn't want to complete the activity because "I'll get upset if it doesn't work and I'll start throwing things".  Pt was attentive and observant to peers and gave them suggestions for their launchers.  Pt was social and pleasant during group session.     Plan: Continue to engage patient in RT group sessions 2-3x/week.   Nora Sabey-McCall, LRT,CTRS 09/07/2022 12:07 PM

## 2022-09-07 NOTE — Group Note (Signed)
Occupational Therapy Group Note  Group Topic:Coping Skills  Group Date: 09/07/2022 Start Time: 1405 End Time: 1450 Facilitators: Brantley Stage, OT   Group Description: Group encouraged increased engagement and participation through discussion and activity focused on "Coping Ahead." Patients were split up into teams and selected a card from a stack of positive coping strategies. Patients were instructed to act out/charade the coping skill for other peers to guess and receive points for their team. Discussion followed with a focus on identifying additional positive coping strategies and patients shared how they were going to cope ahead over the weekend while continuing hospitalization stay.  Therapeutic Goal(s): Identify positive vs negative coping strategies. Identify coping skills to be used during hospitalization vs coping skills outside of hospital/at home Increase participation in therapeutic group environment and promote engagement in treatment   Participation Level: Engaged   Participation Quality: Independent   Behavior: Cooperative   Speech/Thought Process: Loose association    Affect/Mood: Flat   Insight: Fair   Judgement: Fair   Individualization: pt was fairly engaged in their participation of group discussion/activity. New skills were identified  Modes of Intervention: Discussion and Education  Patient Response to Interventions:  Attentive and Receptive   Plan: Continue to engage patient in OT groups 2 - 3x/week.  09/07/2022  Brantley Stage, OT  Cornell Barman, OT

## 2022-09-07 NOTE — Progress Notes (Signed)
The focus of this group is to help patients review their daily goal of treatment and discuss progress on daily workbooks.  Pt attended the evening group and responded to all discussion prompts from the Helper. Pt shared that today was a good day on the unit, the highlight of which was having his personal clothes dropped off. "You just don't feel like yourself without your own clothes."  Of his current goals, Pt mentioned keeping his irritability under control, which he successfully did today. "I just found ways to stay calm. I can't always do it, but I did today."  Pt rated his day an 8 out of 10 and his affect was appropriate.

## 2022-09-07 NOTE — Progress Notes (Signed)
[facility] MD Progress Note  09/07/2022 12:27 PM Cole Henderson  MRN:  [id number]   Reason for Admission:  Cole Henderson is a 46 yo Caucasian male with a prior mental health history of Schizophrenia, paranoid personality d/o, MDD and methamphetamine abuse, who was taken to the [facility] ER by the [facility] police dept under involuntary status for worsening psychosis & property destruction in the context of Methamphetamine abuse and medication non compliance. Pt was transferred to this [facility] for treatment of his psychosis and mental health stabilization. The patient is currently on Hospital Day 3.   Chart Review from last 24 hours:  The patient's chart was reviewed and nursing notes were reviewed. The patient's case was discussed in multidisciplinary team meeting. Per [facility] patient is compliant with the scheduled psychotropic medications on the unit with no side effects reported, no behavioral disturbances reported per staff, patient used trazodone last night as needed for sleep with good efficacy reported.  Information Obtained Today During Patient Interview: The patient was seen and evaluated on the unit. On assessment today the patient reports feeling much better since back on his medications denying any further AVH or paranoia or other delusions he has been out in the dayroom participating in groups and interacting with milieu with no problems noted.  He reports stable mood with improved depression and denies SI HI or AVH or feeling hopeless or helpless.  He denies craving to illicit drugs and able to identify on his own need to abstain completely from mass after discharge.  He tells me that he contacted his daughter yesterday over the phone and she agreed with him to go back staying with her.  I attempted to contact patient's daughter [name] at phone #[phone] but no response and was unable to leave voicemail.  Patient denies side effect to current medication regimen also presents with significant improvement  to involuntary movement and EPS.  He reports good sleep and appetite.    Sleep  Sleep:Sleep: Good   Principal Problem: Schizophrenia (HCC) Diagnosis: Principal Problem:   Schizophrenia (HCC) Active Problems:   Insomnia   Anxiety state   Tardive dyskinesia    Past Psychiatric History: See H&P  Past Medical History:  Past Medical History:  Diagnosis Date   Drug abuse (HCC)    Hypertension    Schizophrenia (HCC)    Tardive dyskinesia    History reviewed. No pertinent surgical history. Family History: History reviewed. No pertinent family history. Family Psychiatric  History: See H&P Social History: See H&P  Sleep: Good, improved  Appetite: Good, improved  Current Medications: Current Facility-Administered Medications  Medication Dose Route Frequency Provider Last Rate Last Admin   acetaminophen (TYLENOL) tablet 650 mg  650 mg Oral Q6H PRN Eligha Bridegroom, NP   650 mg at 09/04/22 2050   alum & mag hydroxide-simeth (MAALOX/MYLANTA) 200-200-20 MG/5ML suspension 30 mL  30 mL Oral Q4H PRN Eligha Bridegroom, NP       diphenhydrAMINE (BENADRYL) injection 50 mg  50 mg Intramuscular Q6H PRN Nkwenti, Doris, NP       Or   diphenhydrAMINE (BENADRYL) capsule 50 mg  50 mg Oral Q6H PRN Nkwenti, Doris, NP       fluPHENAZine (PROLIXIN) tablet 5 mg  5 mg Oral BID Starleen Blue, NP   5 mg at 09/07/22 0017   gabapentin (NEURONTIN) capsule 100 mg  100 mg Oral TID Starleen Blue, NP   100 mg at 09/07/22 1136   hydrOXYzine (ATARAX) tablet 25 mg  25 mg Oral  Q6H PRN Nicholes Rough, NP       LORazepam (ATIVAN) tablet 1 mg  1 mg Oral Q6H PRN Nicholes Rough, NP       Or   LORazepam (ATIVAN) injection 1 mg  1 mg Intravenous Q6H PRN Nkwenti, Doris, NP       magnesium hydroxide (MILK OF MAGNESIA) suspension 30 mL  30 mL Oral Daily PRN Vesta Mixer, NP       nicotine (NICODERM CQ - dosed in mg/24 hours) patch 21 mg  21 mg Transdermal Daily Nkwenti, Doris, NP   21 mg at 09/07/22 0834   sertraline  (ZOLOFT) tablet 100 mg  100 mg Oral Daily Nicholes Rough, NP   100 mg at 09/07/22 3267   traZODone (DESYREL) tablet 50 mg  50 mg Oral QHS PRN Nicholes Rough, NP   50 mg at 09/06/22 2017   trihexyphenidyl (ARTANE) tablet 5 mg  5 mg Oral TID Nicholes Rough, NP   5 mg at 09/07/22 1136    Lab Results:  Results for orders placed or performed during the hospital encounter of 09/04/22 (from the past 48 hour(s))  TSH     Status: None   Collection Time: 09/06/22  6:43 PM  Result Value Ref Range   TSH 1.653 0.350 - 4.500 uIU/mL    Comment: Performed by a 3rd Generation assay with a functional sensitivity of <=0.01 uIU/mL. Performed at [facility], [place] [facility] [address], [place], [place] [address]   Hemoglobin A1c     Status: None   Collection Time: 09/06/22  6:43 PM  Result Value Ref Range   Hgb A1c MFr Bld 5.5 4.8 - 5.6 %    Comment: (NOTE) Pre diabetes:          5.7%-6.4%  Diabetes:              >6.4%  Glycemic control for   <7.0% adults with diabetes    Mean Plasma Glucose 111.15 mg/dL    Comment: Performed at [place] [address] [place], [place] [address]  Lipid panel     Status: Abnormal   Collection Time: 09/06/22  6:43 PM  Result Value Ref Range   Cholesterol 163 0 - 200 mg/dL   Triglycerides 172 (H) <150 mg/dL   HDL 52 >40 mg/dL   Total CHOL/HDL Ratio 3.1 RATIO   VLDL 34 0 - 40 mg/dL   LDL Cholesterol 77 0 - 99 mg/dL    Comment:        Total Cholesterol/HDL:CHD Risk Coronary Heart Disease Risk Table                     Men   Women  1/2 Average Risk   3.4   3.3  Average Risk       5.0   4.4  2 X Average Risk   9.6   7.1  3 X Average Risk  23.4   11.0        Use the calculated Patient Ratio above and the CHD Risk Table to determine the patient's CHD Risk.        ATP III CLASSIFICATION (LDL):  <100     mg/dL   Optimal  100-129  mg/dL   Near or Above                    Optimal  130-159  mg/dL   Borderline  160-189  mg/dL   High  >190  mg/dL   Very High Performed at [facility], [address] [place], [place] [address]   VITAMIN D 25 Hydroxy (Vit-D Deficiency, Fractures)     Status: None   Collection Time: 09/06/22  6:43 PM  Result Value Ref Range   Vit D, 25-Hydroxy 33.57 30 - 100 ng/mL    Comment: (NOTE) Vitamin D deficiency has been defined by the Institute of Medicine  and an Endocrine Society practice guideline as a level of serum 25-OH  vitamin D less than 20 ng/mL (1,2). The Endocrine Society went on to  further define vitamin D insufficiency as a level between 21 and 29  ng/mL (2).  1. IOM (Institute of Medicine). 2010. Dietary reference intakes for  calcium and D. [place]: [employer]. 2. Holick MF, Binkley [place], Bischoff-Ferrari HA, et al. Evaluation,  treatment, and prevention of vitamin D deficiency: an Endocrine  Society clinical practice guideline, JCEM. 2011 Jul; 96(7): 1911-30.  Performed at [facility], [address] [place], [place] [address]   Vitamin B12     Status: None   Collection Time: 09/06/22  6:43 PM  Result Value Ref Range   Vitamin B-12 290 180 - 914 pg/mL    Comment: (NOTE) This assay is not validated for testing neonatal or myeloproliferative syndrome specimens for Vitamin B12 levels. Performed at [facility], [address], [place], [place] [address]     Blood Alcohol level:  Lab Results  Component Value Date   [facility] <10 09/02/2022   ETH <10 08/12/2022    Metabolic Disorder Labs: Lab Results  Component Value Date   HGBA1C 5.5 09/06/2022   MPG 111.15 09/06/2022   MPG 114.02 05/13/2022   No results found for: "PROLACTIN" Lab Results  Component Value Date   CHOL 163 09/06/2022   TRIG 172 (H) 09/06/2022   HDL 52 09/06/2022   CHOLHDL 3.1 09/06/2022   VLDL 34 09/06/2022   LDLCALC 77 09/06/2022   LDLCALC 53 05/13/2022    Physical Findings: AIMS: Facial and Oral Movements Muscles of Facial  Expression: None, normal Lips and Perioral Area: None, normal Jaw: None, normal Tongue: None, normal,Extremity Movements Upper (arms, wrists, hands, fingers): Mild Lower (legs, knees, ankles, toes): Mild, Trunk Movements Neck, shoulders, hips: None, normal, Overall Severity Severity of abnormal movements (highest score from questions above): Mild Incapacitation due to abnormal movements: Minimal Patient's awareness of abnormal movements (rate only patient's report): Aware, no distress, Dental Status Current problems with teeth and/or dentures?: No Does patient usually wear dentures?: No  CIWA:  CIWA-Ar Total: 1 COWS:     Musculoskeletal: Strength & Muscle Tone: within normal limits Gait & Station: normal Patient leans: N/A  Psychiatric Specialty Exam:  General Appearance: Unkempt and disheveled, appears older than stated age  Behavior: Cooperative and pleasant  Psychomotor Activity:No psychomotor agitation or retardation noted  Eye Contact: Fair, improved Speech: Within normal   Mood: Euthymic, improved Affect: Congruent  Thought Process: Linear and goal-directed Descriptions of Associations: Intact Thought Content: Hallucinations: Denies AH, VH  Delusions: No paranoia or other delusions noted Suicidal Thoughts: Denies SI, intention, plan  Homicidal Thoughts: Denies HI, intention, plan   Alertness/Orientation: Alert and oriented  Insight: Improved, fair Judgment: Improved  Memory: Intact  Executive Functions  Concentration: Fair Attention Span: Fair Recall: [employer] of Knowledge: Fair   Assets  Assets: Manufacturing systems engineer; Resilience    Physical Exam: Physical Exam ROS Blood pressure 130/89, pulse (!) 118, temperature 97.7  F (36.5 C), temperature source Oral, resp. rate 16, height 6' (1.829 m), weight 94.1 kg, SpO2 100 %. Body mass index is 28.13 kg/m.   Treatment Plan Summary:  ASSESSMENT:  Diagnoses / Active Problems: Principal Problem:  Schizophrenia (HCC) Diagnosis: Principal Problem:   Schizophrenia (HCC) Active Problems:   Insomnia   Anxiety state   Tardive dyskinesia   PLAN: Safety and Monitoring:  -- Involuntary admission to inpatient psychiatric unit for safety, stabilization and treatment  -- Daily contact with patient to assess and evaluate symptoms and progress in treatment  -- Patient's case to be discussed in multi-disciplinary team meeting  -- Observation Level : q15 minute checks  -- Vital signs:  q12 hours  -- Precautions: suicide, elopement, and assault  2. Medications:   Continue Prolixin 5 mg twice daily for mood and psychosis, effective and well-tolerated  Continue gabapentin 100 mg 3 times daily for anxiety and chronic pain  Continue Zoloft 100 mg daily for depression  Continue Artane 5 mg 3 times daily for EPS    The risks/benefits/side-effects/alternatives to this medication were discussed in detail with the patient and time was given for questions. The patient consents to medication trial.    -- Metabolic profile and EKG monitoring obtained while on an atypical antipsychotic (BMI: Lipid Panel: HbgA1c: QTc:)      3. Pertinent labs: Reviewed      Lab ordered: None   4. Tobacco Use Disorder  -- Nicotine patch 21mg /24 hours ordered  -- Smoking cessation encouraged  5. Group and Therapy: -- Encouraged patient to participate in unit milieu and in scheduled group therapies     Patient was counseled repeatedly regarding need to abstain completely from illicit drug use after discharge, he agrees.  -- Short Term Goals: Ability to identify changes in lifestyle to reduce recurrence of condition will improve, Ability to verbalize feelings will improve, Ability to disclose and discuss suicidal ideas, Ability to demonstrate self-control will improve, and Ability to identify and develop effective coping behaviors will improve  -- Long Term Goals: Improvement in symptoms so as ready for  discharge  6. Discharge Planning:   -- Social work and case management to assist with discharge planning and identification of hospital follow-up needs prior to discharge  -- Estimated LOS: 5-7 days  -- Discharge Concerns: Need to establish a safety plan; Medication compliance and effectiveness  -- Discharge Goals: Return home with outpatient referrals for mental health follow-up including medication management/psychotherapy      Total Time Spent in Direct Patient Care:  I personally spent 35 minutes on the unit in direct patient care. The direct patient care time included face-to-face time with the patient, reviewing the patient's chart, communicating with other professionals, and coordinating care. Greater than 50% of this time was spent in counseling or coordinating care with the patient regarding goals of hospitalization, psycho-education, and discharge planning needs.   Cole Henderson 05-22-1985, MD 09/07/2022, 12:27 PM

## 2022-09-07 NOTE — Plan of Care (Signed)
  Problem: Coping: Goal: Ability to identify and develop effective coping behavior will improve Outcome: Progressing   Problem: Self-Concept: Goal: Ability to identify factors that promote anxiety will improve Outcome: Progressing Goal: Level of anxiety will decrease Outcome: Progressing Goal: Ability to modify response to factors that promote anxiety will improve Outcome: Progressing   Problem: Activity: Goal: Will verbalize the importance of balancing activity with adequate rest periods Outcome: Progressing

## 2022-09-07 NOTE — Progress Notes (Signed)
   09/07/22 0800  Psych Admission Type (Psych Patients Only)  Admission Status Involuntary  Psychosocial Assessment  Patient Complaints Anxiety  Eye Contact Fair  Facial Expression Animated;Anxious  Affect Anxious  Speech Logical/coherent;Slurred  Interaction Assertive  Motor Activity Other (Comment) (WNL)  Appearance/Hygiene Unremarkable  Behavior Characteristics Cooperative;Appropriate to situation  Mood Anxious;Pleasant  Thought Process  Coherency WDL  Content WDL  Delusions None reported or observed  Perception WDL  Hallucination None reported or observed  Judgment Poor  Confusion None  Danger to Self  Current suicidal ideation? Denies  Agreement Not to Harm Self Yes  Description of Agreement Verbal contract  Danger to Others  Danger to Others None reported or observed

## 2022-09-08 DIAGNOSIS — F2089 Other schizophrenia: Secondary | ICD-10-CM | POA: Diagnosis not present

## 2022-09-08 MED ORDER — GABAPENTIN 100 MG PO CAPS
200.0000 mg | ORAL_CAPSULE | Freq: Three times a day (TID) | ORAL | Status: DC
  Administered 2022-09-08 – 2022-09-10 (×6): 200 mg via ORAL
  Filled 2022-09-08 (×9): qty 2

## 2022-09-08 MED ORDER — FLUPHENAZINE HCL 5 MG PO TABS
10.0000 mg | ORAL_TABLET | Freq: Two times a day (BID) | ORAL | Status: DC
  Administered 2022-09-08 – 2022-09-10 (×4): 10 mg via ORAL
  Filled 2022-09-08 (×6): qty 1

## 2022-09-08 NOTE — Progress Notes (Cosign Needed Addendum)
PMHNP student called pt's daughter Cloyde Reams to discuss [name]'s perception of pt going to [facility] post d/c.  Called twice.  No answer both times.  This Probation officer left HIPAA compliant voicemail, requesting call back.  [name]'s phone number per pt:  [phone].

## 2022-09-08 NOTE — Group Note (Signed)
Recreation Therapy Group Note   Group Topic:Problem Solving  Group Date: 09/08/2022 Start Time: 1005 End Time: 1035 Facilitators: Rasa Degrazia-McCall, LRT,CTRS Location: [address] Dayroom   Goal Area(s) Addresses:  Patient will effectively work with peer towards shared goal.  Patient will identify skills used to make activity successful.  Patient will identify how skills used during activity can be used to reach post d/c goals.    Group Description:  Lily Pad. Working together, patients were asked to use colored discs to get the entire team from one end of the hall way to the other. Patients were only allowed to move down and back the hallway by stepping on the discs, the team was provided 1 additional disc to assist with them completing task.  In addition, LRT would place cones in the path of the group forcing them to adjust their path.   Affect/Mood: Appropriate   Participation Level: N/A   Participation Quality: N/A   Behavior: N/A   Speech/Thought Process: N/A   Insight: N/A   Judgement: N/A   Modes of Intervention: Cooperative Play   Patient Response to Interventions:  N/A   Education Outcome:  Acknowledges education and In group clarification offered    Clinical Observations/Individualized Feedback: Pt listened to the directions but decided not to participate in group.  Pt left and did not return.    Plan: Continue to engage patient in RT group sessions 2-3x/week.   Tymara Saur-McCall, LRT,CTRS 09/08/2022 12:39 PM

## 2022-09-08 NOTE — Plan of Care (Signed)
?  Problem: Coping: ?Goal: Ability to identify and develop effective coping behavior will improve ?Outcome: Progressing ?  ?Problem: Self-Concept: ?Goal: Ability to identify factors that promote anxiety will improve ?Outcome: Progressing ?Goal: Level of anxiety will decrease ?Outcome: Progressing ?Goal: Ability to modify response to factors that promote anxiety will improve ?Outcome: Progressing ?  ?

## 2022-09-08 NOTE — Progress Notes (Signed)
[facility] MD Progress Note  09/08/2022 4:26 PM Cole Henderson  MRN:  [id number] Principal Problem: Schizophrenia (HCC) Diagnosis: Principal Problem:   Schizophrenia (HCC) Active Problems:   Insomnia   Anxiety state   Tardive dyskinesia  History of Present Illness: Cole Henderson is a 46 yo Caucasian male with a prior mental health history of Schizophrenia, paranoid personality d/o, MDD and methamphetamine abuse, who was taken to the [facility] by the [facility] police dept under involuntary status for worsening psychosis & property destruction in the context of Methamphetamine abuse and medication non compliance. Pt was transferred to this [facility] for treatment of his psychosis and mental health stabilization.   24 hr chart review: BP continues to run slightly elevated and HR also slightlmy elevated earlier today morning at 116. He is continuing to be compliant with his scheduled medications and required Trazodone 50 mg last night for insomnia. Pt has attended most group sessions in the past 24 hrs and has been cooperative and appropriate.  Assessment note (10/6): Pt presents today with an anxious mood and affect is congruent. His attention to personal hygiene and grooming is fair, eye contact is good, speech is clear & coherent. Thought are organized and logical, and pt currently denies SI/HI/VH or paranoia. There is no evidence of delusional thoughts.  Patient however, reports auditory hallucinations of voices which returned earlier today morning, and are loud.  Patient states that the voices are talking about "democratic abuse over the Republicans".  Patient reports that whenever he is stressing out, he hears more voices.  Patient reports that the voices are multiple and random.   There  continues to be a day to day improvement in pt's involuntary movements as compared to at admission. He also reports that the numbness to his fingers is resolving with the addition of Gabapentin to his medication regimen.  We are increasing Gabapentin from 100 mg TID to 200 mg TID for neuropathy & anxiety. We are increasing Prolixin to 10 mg BID for management of psychosis. We are continuing other medications as noted below. Pt reports a good appetite, reports a good sleep quality. He reports that he had a headache earlier today morning, took some Tylenol, and it has resolved.  AIMS: 5 today which is an improvement today as compared to 14 yesterday.   Patient has reported that upon discharge he will be going to live with his daughter and mother.  Patient provided phone numbers with the attending provider called and no answer was gotten.  Writer and NP student also called a number but there was no answer.  A HIPAA compliant voicemail was left, awaiting return call.  We will revisit discharge planning with patient tomorrow, and also talked to him about the possibility of going to a shelter after discharge, in the event that he cannot go live with his family or friends.  Total Time spent with patient: 20 minutes  Past Psychiatric History: Schizophrenia  Past Medical History:  Past Medical History:  Diagnosis Date   Drug abuse (HCC)    Hypertension    Schizophrenia (HCC)    Tardive dyskinesia    History reviewed. No pertinent surgical history. Family History: History reviewed. No pertinent family history. Family Psychiatric  History: See H & P Social History:  Social History   Substance and Sexual Activity  Alcohol Use Yes     Social History   Substance and Sexual Activity  Drug Use Yes   Types: Methamphetamines    Social History  Socioeconomic History   Marital status: Single    Spouse name: Not on file   Number of children: Not on file   Years of education: Not on file   Highest education level: Not on file  Occupational History   Not on file  Tobacco Use   Smoking status: Every Day    Packs/day: 0.50    Types: Cigarettes   Smokeless tobacco: Never  Substance and Sexual Activity   Alcohol  use: Yes   Drug use: Yes    Types: Methamphetamines   Sexual activity: Not on file  Other Topics Concern   Not on file  Social History Narrative   Not on file   Social Determinants of Health   Financial Resource Strain: Not on file  Food Insecurity: No Food Insecurity (09/04/2022)   Hunger Vital Sign    Worried About Running Out of Food in the Last Year: Never true    Ran Out of Food in the Last Year: Never true  Transportation Needs: No Transportation Needs (09/04/2022)   PRAPARE - Administrator, Civil Service (Medical): No    Lack of Transportation (Non-Medical): No  Physical Activity: Not on file  Stress: Not on file  Social Connections: Not on file   Additional Social History:    Sleep: Good  Appetite:  Good  Current Medications: Current Facility-Administered Medications  Medication Dose Route Frequency Provider Last Rate Last Admin   acetaminophen (TYLENOL) tablet 650 mg  650 mg Oral Q6H PRN Eligha Bridegroom, NP   650 mg at 09/08/22 0802   alum & mag hydroxide-simeth (MAALOX/MYLANTA) 200-200-20 MG/5ML suspension 30 mL  30 mL Oral Q4H PRN Eligha Bridegroom, NP       diphenhydrAMINE (BENADRYL) injection 50 mg  50 mg Intramuscular Q6H PRN Lisbeth Puller, NP       Or   diphenhydrAMINE (BENADRYL) capsule 50 mg  50 mg Oral Q6H PRN Therma Lasure, NP       fluPHENAZine (PROLIXIN) tablet 10 mg  10 mg Oral BID Philip Eckersley, NP       gabapentin (NEURONTIN) capsule 200 mg  200 mg Oral TID Starleen Blue, NP       hydrOXYzine (ATARAX) tablet 25 mg  25 mg Oral Q6H PRN Chane Cowden, NP       LORazepam (ATIVAN) tablet 1 mg  1 mg Oral Q6H PRN Tong Pieczynski, NP       Or   LORazepam (ATIVAN) injection 1 mg  1 mg Intravenous Q6H PRN Abigaile Rossie, NP       magnesium hydroxide (MILK OF MAGNESIA) suspension 30 mL  30 mL Oral Daily PRN Eligha Bridegroom, NP       nicotine (NICODERM CQ - dosed in mg/24 hours) patch 21 mg  21 mg Transdermal Daily Taryn Nave, NP   21 mg at  09/08/22 0805   sertraline (ZOLOFT) tablet 100 mg  100 mg Oral Daily Marguerite Jarboe, NP   100 mg at 09/08/22 0804   traZODone (DESYREL) tablet 50 mg  50 mg Oral QHS PRN Starleen Blue, NP   50 mg at 09/07/22 2028   trihexyphenidyl (ARTANE) tablet 5 mg  5 mg Oral TID Starleen Blue, NP   5 mg at 09/08/22 1159    Lab Results:  Results for orders placed or performed during the hospital encounter of 09/04/22 (from the past 48 hour(s))  TSH     Status: None   Collection Time: 09/06/22  6:43 PM  Result Value Ref  Range   TSH 1.653 0.350 - 4.500 uIU/mL    Comment: Performed by a 3rd Generation assay with a functional sensitivity of <=0.01 uIU/mL. Performed at [facility], [address] [place], [place] [address]   Hemoglobin A1c     Status: None   Collection Time: 09/06/22  6:43 PM  Result Value Ref Range   Hgb A1c MFr Bld 5.5 4.8 - 5.6 %    Comment: (NOTE) Pre diabetes:          5.7%-6.4%  Diabetes:              >6.4%  Glycemic control for   <7.0% adults with diabetes    Mean Plasma Glucose 111.15 mg/dL    Comment: Performed at [facility], [address] [place], [place] [address]  Lipid panel     Status: Abnormal   Collection Time: 09/06/22  6:43 PM  Result Value Ref Range   Cholesterol 163 0 - 200 mg/dL   Triglycerides 025172 (H) <150 mg/dL   HDL 52 >42>40 mg/dL   Total CHOL/HDL Ratio 3.1 RATIO   VLDL 34 0 - 40 mg/dL   LDL Cholesterol 77 0 - 99 mg/dL    Comment:        Total Cholesterol/HDL:CHD Risk Coronary Heart Disease Risk Table                     Men   Women  1/2 Average Risk   3.4   3.3  Average Risk       5.0   4.4  2 X Average Risk   9.6   7.1  3 X Average Risk  23.4   11.0        Use the calculated Patient Ratio above and the CHD Risk Table to determine the patient's CHD Risk.        ATP III CLASSIFICATION (LDL):  <100     mg/dL   Optimal  [id number]  mg/dL   Near or Above                    Optimal  130-159  mg/dL   Borderline   [id number]  mg/dL   High  >176>190     mg/dL   Very High Performed at [facility], [address] [place], [place] [address]   VITAMIN D 25 Hydroxy (Vit-D Deficiency, Fractures)     Status: None   Collection Time: 09/06/22  6:43 PM  Result Value Ref Range   Vit D, 25-Hydroxy 33.57 30 - 100 ng/mL    Comment: (NOTE) Vitamin D deficiency has been defined by the Institute of Medicine  and an Endocrine Society practice guideline as a level of serum 25-OH  vitamin D less than 20 ng/mL (1,2). The Endocrine Society went on to  further define vitamin D insufficiency as a level between 21 and 29  ng/mL (2).  1. IOM (Institute of Medicine). 2010. Dietary reference intakes for  calcium and D. [place]: [employer] Academ[employer]. 2. Holick MF, Binkley [place], Bischoff-Ferrari HA, et al. Evaluation,  treatment, and prevention of vitamin D deficiency: an Endocrine  Society clinical practice guideline, JCEM. 2011 Jul; 96(7): 1911-30.  Performed at [facility], [address] [place], [place] [address]   Vitamin B12     Status: None   Collection Time: 09/06/22  6:43 PM  Result Value Ref Range   Vitamin B-12 290 180 -  914 pg/mL    Comment: (NOTE) This assay is not validated for testing neonatal or myeloproliferative syndrome specimens for Vitamin B12 levels. Performed at [facility], [address], [place], [place] [address]     Blood Alcohol level:  Lab Results  Component Value Date   ETH <10 09/02/2022   ETH <10 08/12/2022    Metabolic Disorder Labs: Lab Results  Component Value Date   HGBA1C 5.5 09/06/2022   MPG 111.15 09/06/2022   MPG 114.02 05/13/2022   No results found for: "PROLACTIN" Lab Results  Component Value Date   CHOL 163 09/06/2022   TRIG 172 (H) 09/06/2022   HDL 52 09/06/2022   CHOLHDL 3.1 09/06/2022   VLDL 34 09/06/2022   LDLCALC 77 09/06/2022   LDLCALC 53 05/13/2022    Physical Findings: AIMS: Facial and  Oral Movements Muscles of Facial Expression: None, normal Lips and Perioral Area: None, normal Jaw: Minimal Tongue: None, normal,Extremity Movements Upper (arms, wrists, hands, fingers): Minimal Lower (legs, knees, ankles, toes): Minimal, Trunk Movements Neck, shoulders, hips: None, normal, Overall Severity Severity of abnormal movements (highest score from questions above): Minimal Incapacitation due to abnormal movements: Minimal Patient's awareness of abnormal movements (rate only patient's report): No Awareness, Dental Status Current problems with teeth and/or dentures?: No Does patient usually wear dentures?: No  CIWA:  CIWA-Ar Total: 1 COWS:     Musculoskeletal: Strength & Muscle Tone: within normal limits Gait & Station: normal Patient leans: N/A  Psychiatric Specialty Exam:  Presentation  General Appearance:  Casual  Eye Contact: Good  Speech: Clear and Coherent  Speech Volume: Normal  Handedness: Right   Mood and Affect  Mood: Depressed  Affect: Congruent   Thought Process  Thought Processes: Coherent  Descriptions of Associations:Intact  Orientation:Full (Time, Place and Person)  Thought Content:Logical; WDL  History of Schizophrenia/Schizoaffective disorder:Yes  Duration of Psychotic Symptoms:Greater than six months  Hallucinations:Hallucinations: Auditory Description of Auditory Hallucinations: voices talking about the "democrats abuse on the republicans"  Ideas of Reference:None  Suicidal Thoughts:Suicidal Thoughts: No  Homicidal Thoughts:Homicidal Thoughts: No   Sensorium  Memory: Immediate Good  Judgment: Fair  Insight: Fair   Chartered certified accountant: Fair  Attention Span: Fair  Recall: Fair  Fund of Knowledge: Fair  Language: Fair   Psychomotor Activity  Psychomotor Activity:Psychomotor Activity: Extrapyramidal Side Effects (EPS) Extrapyramidal Side Effects (EPS): Tardive Dyskinesia AIMS  Completed?: Yes (AIMS: 5)   Assets  Assets: Manufacturing systems engineer; Resilience   Sleep  Sleep:Sleep: Good  Physical Exam: Physical Exam Constitutional:      Appearance: Normal appearance.  HENT:     Head: Normocephalic.  Eyes:     Pupils: Pupils are equal, round, and reactive to light.  Musculoskeletal:     Cervical back: Normal range of motion.  Neurological:     Mental Status: He is alert and oriented to person, place, and time.  Psychiatric:        Behavior: Behavior normal.    Review of Systems  Constitutional: Negative.   HENT: Negative.    Eyes: Negative.   Respiratory: Negative.    Cardiovascular: Negative.   Gastrointestinal: Negative.   Genitourinary: Negative.   Musculoskeletal: Negative.   Skin: Negative.   Neurological: Negative.   Psychiatric/Behavioral:  Positive for depression, hallucinations and substance abuse. Negative for memory loss and suicidal ideas. The patient is nervous/anxious. The patient does not have insomnia.    Blood pressure 129/88, pulse (!) 116, temperature (!) 97.5 F (36.4 C), temperature  source Oral, resp. rate 18, height 6' (1.829 m), weight 94.1 kg, SpO2 100 %. Body mass index is 28.13 kg/m.  Treatment Plan Summary:   ASSESSMENT:   Diagnoses / Active Problems: Principal Problem: Schizophrenia (HCC) Diagnosis: Principal Problem:   Schizophrenia (HCC) Active Problems:   Insomnia   Anxiety state   Tardive dyskinesia     PLAN: Safety and Monitoring:             -- Involuntary admission to inpatient psychiatric unit for safety, stabilization and treatment             -- Daily contact with patient to assess and evaluate symptoms and progress in treatment             -- Patient's case to be discussed in multi-disciplinary team meeting             -- Observation Level : q15 minute checks             -- Vital signs:  q12 hours             -- Precautions: suicide, elopement, and assault   2. Medications:  -Increase  Prolixin to 10 mg twice daily for mood and psychosis, effective and well-tolerated -Increase gabapentin 200 mg 3 times daily for anxiety and chronic pain -Continue Zoloft 100 mg daily for depression -Continue Artane 5 mg 3 times daily for EPS -Continue Agitation protocol (Ativan/Benadryl PRN)-See MAR               The risks/benefits/side-effects/alternatives to this medication were discussed in detail with the patient and time was given for questions. The patient consents to medication trial.                -- Metabolic profile and EKG monitoring obtained while on an atypical antipsychotic (BMI: Lipid Panel: HbgA1c: QTc:)                           3. Pertinent labs:Labs Reviewed independently today, 09/08/2022: CMP WNL. CBC WNL. Baseline UA WNL. EKG NSR with QTC-418. Lipid panel with triglycerides at 172. Will need PCP f/u after discharge. , hemoglobin A1C WNL, TSH WNL, Vitamin D & B12 WNL. Tox screen positive for THC, Amphetamines and Opiates-denies Opiate use.        Lab ordered: Repeat CK-previous is 511 (9/26)   4. Tobacco Use Disorder             -- Nicotine patch 21mg /24 hours ordered             -- Smoking cessation encouraged   5. Group and Therapy: -- Encouraged patient to participate in unit milieu and in scheduled group therapies                           Patient was counseled repeatedly regarding need to abstain completely from illicit drug use after discharge, he agrees.             -- Short Term Goals: Ability to identify changes in lifestyle to reduce recurrence of condition will improve, Ability to verbalize feelings will improve, Ability to disclose and discuss suicidal ideas, Ability to demonstrate self-control will improve, and Ability to identify and develop effective coping behaviors will improve             -- Long Term Goals: Improvement in symptoms so as ready for discharge   6. Discharge Planning:              --  Social work and case management to assist with  discharge planning and identification of hospital follow-up needs prior to discharge             -- Estimated LOS: 5-7 days             -- Discharge Concerns: Need to establish a safety plan; Medication compliance and effectiveness             -- Discharge Goals: Return home with outpatient referrals for mental health follow-up including medication management/psychotherapy    Nicholes Rough, NP 09/08/2022, 4:26 PM

## 2022-09-08 NOTE — Progress Notes (Signed)
   09/08/22 0800  Psych Admission Type (Psych Patients Only)  Admission Status Involuntary  Psychosocial Assessment  Patient Complaints Anxiety  Eye Contact Fair  Facial Expression Anxious  Affect Anxious  Speech Slurred  Interaction Assertive  Motor Activity Slow  Appearance/Hygiene Unremarkable  Behavior Characteristics Cooperative;Unable to participate  Mood Pleasant  Thought Process  Coherency WDL  Content WDL  Delusions None reported or observed  Perception WDL  Hallucination None reported or observed  Judgment Poor  Confusion None  Danger to Self  Current suicidal ideation? Denies  Agreement Not to Harm Self Yes  Description of Agreement Verbal contract  Danger to Others  Danger to Others None reported or observed

## 2022-09-08 NOTE — BHH Group Notes (Signed)
  Spirituality group facilitated by Kathrynn Humble, [place].   Group Description: Group focused on topic of strength. Patients participated in facilitated discussion around topic, connecting with one another around experiences and definitions for hope. Group members engaged with visual explorer photos, reflecting on what strength looks like for them today. Group engaged in discussion around how their definitions of hope are present today in hospital.   Modalities: Psycho-social ed, Adlerian, Narrative, MI   Patient Progress: Did not attend.

## 2022-09-08 NOTE — Group Note (Signed)
LCSW Group Therapy Note   Group Date: 09/08/2022 Start Time: 1100 End Time: 1200   Type of Therapy and Topic:  Group Therapy: Boundaries  Participation Level:  Active  Description of Group: This group will address the use of boundaries in their personal lives. Patients will explore why boundaries are important, the difference between healthy and unhealthy boundaries, and negative and postive outcomes of different boundaries and will look at how boundaries can be crossed.  Patients will be encouraged to identify current boundaries in their own lives and identify what kind of boundary is being set. Facilitators will guide patients in utilizing problem-solving interventions to address and correct types boundaries being used and to address when no boundary is being used. Understanding and applying boundaries will be explored and addressed for obtaining and maintaining a balanced life. Patients will be encouraged to explore ways to assertively make their boundaries and needs known to significant others in their lives, using other group members and facilitator for role play, support, and feedback.  Therapeutic Goals:  1.  Patient will identify areas in their life where setting clear boundaries could be  used to improve their life.  2.  Patient will identify signs/triggers that a boundary is not being respected. 3.  Patient will identify two ways to set boundaries in order to achieve balance in  their lives: 4.  Patient will demonstrate ability to communicate their needs and set boundaries  through discussion and/or role plays  Summary of Patient Progress:  Cole Henderson was present/active throughout the session and proved open to feedback from Cole Henderson and peers. Patient demonstrated good insight into the subject matter, was respectful of peers, and was present throughout the entire session. Patient identified his daughters as individuals he would like to improve boundaries with.  Therapeutic Modalities:    Cognitive Behavioral Therapy Solution-Focused Therapy  Cole Conch, LCSW 09/08/2022  1:06 PM

## 2022-09-08 NOTE — Progress Notes (Signed)
   09/08/22 2200  Psych Admission Type (Psych Patients Only)  Admission Status Involuntary  Psychosocial Assessment  Patient Complaints Anxiety  Eye Contact Fair  Facial Expression Animated  Affect Appropriate to circumstance  Speech Slurred  Interaction Assertive  Motor Activity Slow  Appearance/Hygiene Unremarkable  Behavior Characteristics Appropriate to situation;Cooperative  Mood Pleasant  Thought Process  Coherency WDL  Content WDL  Delusions None reported or observed  Perception WDL  Hallucination None reported or observed  Judgment Poor  Confusion None  Danger to Self  Current suicidal ideation? Denies  Agreement Not to Harm Self Yes  Description of Agreement verbal  Danger to Others  Danger to Others None reported or observed

## 2022-09-08 NOTE — B[facility] Group Notes (Signed)
Patient did not attend goals group, despite encouragement.  

## 2022-09-09 DIAGNOSIS — F2089 Other schizophrenia: Secondary | ICD-10-CM | POA: Diagnosis not present

## 2022-09-09 NOTE — Progress Notes (Signed)
   09/09/22 0800  Psych Admission Type (Psych Patients Only)  Admission Status Involuntary  Psychosocial Assessment  Patient Complaints Anxiety  Eye Contact Fair  Facial Expression Animated  Affect Appropriate to circumstance  Speech Slurred  Interaction Assertive  Motor Activity Slow  Appearance/Hygiene Unremarkable  Behavior Characteristics Appropriate to situation;Cooperative  Mood Pleasant  Thought Process  Coherency WDL  Content WDL  Delusions None reported or observed  Perception WDL  Hallucination None reported or observed  Judgment Poor  Confusion None  Danger to Self  Current suicidal ideation? Denies  Agreement Not to Harm Self Yes  Description of Agreement Verbal  Danger to Others  Danger to Others None reported or observed

## 2022-09-09 NOTE — Plan of Care (Signed)
  Problem: Coping: Goal: Ability to identify and develop effective coping behavior will improve Outcome: Progressing   Problem: Activity: Goal: Will verbalize the importance of balancing activity with adequate rest periods Outcome: Progressing   Problem: Self-Concept: Goal: Ability to identify factors that promote anxiety will improve Outcome: Progressing Goal: Level of anxiety will decrease Outcome: Progressing Goal: Ability to modify response to factors that promote anxiety will improve Outcome: Progressing

## 2022-09-09 NOTE — Group Note (Signed)
LCSW Group Therapy Note  09/09/2022      Type of Therapy and Topic:  Group Therapy: Gratitude  Participation Level:  Active   Description of Group:   In this group, patients shared and discussed the importance of acknowledging the elements in their lives for which they are grateful and how this can positively impact their mood.  The group discussed how bringing the positive elements of their lives to the forefront of their minds can help with recovery from any illness, physical or mental.  An exercise was done as a group in which a list was made of gratitude items in order to encourage participants to consider other potential positives in their lives.  Therapeutic Goals: Patients will identify one or more item for which they are grateful in each of 6 categories:  people, experience, thing, place, skill, and other. Patients will discuss how it is possible to seek out gratitude in even bad situations. Patients will explore other possible items of gratitude that they could remember.   Summary of Patient Progress:  The patient shared that to them, gratitude meant "being blessed". They shared they were grateful for "my grandkids, I spoil them". They were able to listen to other group members thoughts and contribute to the conversation throughout group.  Therapeutic Modalities:   Solution-Focused Therapy Activity  Dorothe Elmore, LCSWA 1:19 PM

## 2022-09-09 NOTE — B[facility] Group Notes (Signed)
Psychoeducational Group Note  Date: 09/09/22 Time: 0900-1000    Goal Setting   Purpose of Group: This group helps to provide patients with the steps of setting a goal that is specific, measurable, attainable, realistic and time specific. A discussion on how we keep ourselves stuck with negative self talk. Homework given for Patients to write 20 positive attributes about themselves and share it with the group.    Participation Level:  did not attend  Paulino Rily

## 2022-09-09 NOTE — Progress Notes (Signed)
[facility] MD Progress Note  09/09/2022 1:53 PM Cole Henderson  MRN:  [id number] Principal Problem: Schizophrenia (HCC) Diagnosis: Principal Problem:   Schizophrenia (HCC) Active Problems:   Insomnia   Anxiety state   Tardive dyskinesia  History of Present Illness: Cole Henderson is a 46 yo Caucasian male with a prior mental health history of Schizophrenia, paranoid personality d/o, MDD and methamphetamine abuse, who was taken to the [facility] ER by the [facility] police dept under involuntary status for worsening psychosis & property destruction in the context of Methamphetamine abuse and medication non compliance. Pt was transferred to this [facility] for treatment of his psychosis and mental health stabilization.   24 hr chart review: BP over the past 24 hours has been within normal limits. HR was slightly elevated earlier today morning at 110. Pt is continuing to be compliant with his scheduled medications and only as needed medication taken in the past 24 hours has been Tylenol 650 mg given yesterday morning for pain, and milk of mag given yesterday evening for constipation.  Pt has attended most group sessions in the past 24 hrs and has been cooperative and appropriate.  Patient has not presented any behavioral issues on the unit in the past 24 hours.  Assessment note (10/7): Pt presents today with a euthymic mood, his attention to personal hygiene and grooming is fair, eye contact is good, speech is clear & coherent. Thought contents are organized and logical, and pt currently denies SI/HI/AVH or paranoia. There is no evidence of delusional thoughts.    Patient reports that the increase in the dose of his Prolixin to 10 mg twice daily was very helpful, and that his auditory hallucinations have resolved.  He denies any medication related side effects, and the involuntary movements to his upper and lower extremities are currently very minimal.  Aims at 3.  Patient reports that his sleep quality last night was  good, and that his appetite is good.  He reports that he had a bowel movement today, reported a mild headache but stated that Tylenol was helpful.  There are currently no concerns reported by patient, and he is verbalizing readiness for discharge.  Writer spoke to patient's daughter [name], at [phone].  Patient's daughter is agreeable to patient coming back to her home, she reports that she lives here in [place], and that patient's boss from work will pick him up tomorrow at discharge.  We will plan to discharge patient tomorrow, and continue medications as listed below.  Total Time spent with patient: 20 minutes  Past Psychiatric History: Schizophrenia  Past Medical History:  Past Medical History:  Diagnosis Date   Drug abuse (HCC)    Hypertension    Schizophrenia (HCC)    Tardive dyskinesia    History reviewed. No pertinent surgical history. Family History: History reviewed. No pertinent family history. Family Psychiatric  History: See H & P Social History:  Social History   Substance and Sexual Activity  Alcohol Use Yes     Social History   Substance and Sexual Activity  Drug Use Yes   Types: Methamphetamines    Social History   Socioeconomic History   Marital status: Single    Spouse name: Not on file   Number of children: Not on file   Years of education: Not on file   Highest education level: Not on file  Occupational History   Not on file  Tobacco Use   Smoking status: Every Day    Packs/day: 0.50  Types: Cigarettes   Smokeless tobacco: Never  Substance and Sexual Activity   Alcohol use: Yes   Drug use: Yes    Types: Methamphetamines   Sexual activity: Not on file  Other Topics Concern   Not on file  Social History Narrative   Not on file   Social Determinants of Health   Financial Resource Strain: Not on file  Food Insecurity: No Food Insecurity (09/04/2022)   Hunger Vital Sign    Worried About Running Out of Food in the Last Year: Never true     Ran Out of Food in the Last Year: Never true  Transportation Needs: No Transportation Needs (09/04/2022)   PRAPARE - Administrator, Civil Service (Medical): No    Lack of Transportation (Non-Medical): No  Physical Activity: Not on file  Stress: Not on file  Social Connections: Not on file   Additional Social History:    Sleep: Good  Appetite:  Good  Current Medications: Current Facility-Administered Medications  Medication Dose Route Frequency Provider Last Rate Last Admin   acetaminophen (TYLENOL) tablet 650 mg  650 mg Oral Q6H PRN Eligha Bridegroom, NP   650 mg at 09/08/22 0802   alum & mag hydroxide-simeth (MAALOX/MYLANTA) 200-200-20 MG/5ML suspension 30 mL  30 mL Oral Q4H PRN Eligha Bridegroom, NP       diphenhydrAMINE (BENADRYL) injection 50 mg  50 mg Intramuscular Q6H PRN Taleen Prosser, NP       Or   diphenhydrAMINE (BENADRYL) capsule 50 mg  50 mg Oral Q6H PRN Starleen Blue, NP       fluPHENAZine (PROLIXIN) tablet 10 mg  10 mg Oral BID Starleen Blue, NP   10 mg at 09/09/22 0805   gabapentin (NEURONTIN) capsule 200 mg  200 mg Oral TID Starleen Blue, NP   200 mg at 09/09/22 1154   hydrOXYzine (ATARAX) tablet 25 mg  25 mg Oral Q6H PRN Yazmina Pareja, NP       LORazepam (ATIVAN) tablet 1 mg  1 mg Oral Q6H PRN Heriberto Stmartin, NP       Or   LORazepam (ATIVAN) injection 1 mg  1 mg Intravenous Q6H PRN Anzley Dibbern, NP       magnesium hydroxide (MILK OF MAGNESIA) suspension 30 mL  30 mL Oral Daily PRN Eligha Bridegroom, NP   30 mL at 09/08/22 1834   nicotine (NICODERM CQ - dosed in mg/24 hours) patch 21 mg  21 mg Transdermal Daily Kamyah Wilhelmsen, NP   21 mg at 09/09/22 0805   sertraline (ZOLOFT) tablet 100 mg  100 mg Oral Daily Nahzir Pohle, NP   100 mg at 09/09/22 0805   traZODone (DESYREL) tablet 50 mg  50 mg Oral QHS PRN Starleen Blue, NP   50 mg at 09/07/22 2028   trihexyphenidyl (ARTANE) tablet 5 mg  5 mg Oral TID Starleen Blue, NP   5 mg at 09/09/22 1154     Lab Results:  No results found for this or any previous visit (from the past 48 hour(s)).   Blood Alcohol level:  Lab Results  Component Value Date   ETH <10 09/02/2022   ETH <10 08/12/2022    Metabolic Disorder Labs: Lab Results  Component Value Date   HGBA1C 5.5 09/06/2022   MPG 111.15 09/06/2022   MPG 114.02 05/13/2022   No results found for: "PROLACTIN" Lab Results  Component Value Date   CHOL 163 09/06/2022   TRIG 172 (H) 09/06/2022   HDL 52 09/06/2022  CHOLHDL 3.1 09/06/2022   VLDL 34 09/06/2022   LDLCALC 77 09/06/2022   LDLCALC 53 05/13/2022    Physical Findings: AIMS: Facial and Oral Movements Muscles of Facial Expression: None, normal Lips and Perioral Area: None, normal Jaw: None, normal Tongue: None, normal,Extremity Movements Upper (arms, wrists, hands, fingers): Minimal Lower (legs, knees, ankles, toes): Minimal, Trunk Movements Neck, shoulders, hips: None, normal, Overall Severity Severity of abnormal movements (highest score from questions above): None, normal Incapacitation due to abnormal movements: Minimal Patient's awareness of abnormal movements (rate only patient's report): No Awareness, Dental Status Current problems with teeth and/or dentures?: No Does patient usually wear dentures?: No  CIWA:  CIWA-Ar Total: 1 COWS:     Musculoskeletal: Strength & Muscle Tone: within normal limits Gait & Station: normal Patient leans: N/A  Psychiatric Specialty Exam:  Presentation  General Appearance:  Appropriate for Environment; Fairly Groomed  Eye Contact: Good  Speech: Clear and Coherent  Speech Volume: Normal  Handedness: Right   Mood and Affect  Mood: Euthymic  Affect: Congruent   Thought Process  Thought Processes: Coherent  Descriptions of Associations:Intact  Orientation:Full (Time, Place and Person)  Thought Content:Logical; WDL  History of Schizophrenia/Schizoaffective disorder:Yes  Duration of  Psychotic Symptoms:Greater than six months  Hallucinations:Hallucinations: None Description of Auditory Hallucinations: voices talking about the "democrats abuse on the republicans"  Ideas of Reference:None  Suicidal Thoughts:Suicidal Thoughts: No  Homicidal Thoughts:Homicidal Thoughts: No  Sensorium  Memory: Immediate Good  Judgment: Good  Insight: Good  Executive Functions  Concentration: Good  Attention Span: Good  Recall: Good  Fund of Knowledge: Good  Language: Good  Psychomotor Activity  Psychomotor Activity:Psychomotor Activity: Normal Extrapyramidal Side Effects (EPS): Tardive Dyskinesia AIMS Completed?: Yes (AIMS: 3)  Assets  Assets: Communication Skills; Housing  Sleep  Sleep:Sleep: Good  Physical Exam: Physical Exam Constitutional:      Appearance: Normal appearance.  HENT:     Head: Normocephalic.  Eyes:     Pupils: Pupils are equal, round, and reactive to light.  Musculoskeletal:     Cervical back: Normal range of motion.  Neurological:     Mental Status: He is alert and oriented to person, place, and time.  Psychiatric:        Behavior: Behavior normal.    Review of Systems  Constitutional: Negative.   HENT: Negative.    Eyes: Negative.   Respiratory: Negative.    Cardiovascular: Negative.   Gastrointestinal: Negative.   Genitourinary: Negative.   Musculoskeletal: Negative.   Skin: Negative.   Neurological: Negative.   Psychiatric/Behavioral:  Positive for depression and substance abuse. Negative for hallucinations, memory loss and suicidal ideas. The patient is nervous/anxious. The patient does not have insomnia.    Blood pressure 117/83, pulse (!) 110, temperature 97.7 F (36.5 C), temperature source Oral, resp. rate 16, height 6' (1.829 m), weight 94.1 kg, SpO2 99 %. Body mass index is 28.13 kg/m.  Treatment Plan Summary:   ASSESSMENT:   Diagnoses / Active Problems: Principal Problem: Schizophrenia  (HCC) Diagnosis: Principal Problem:   Schizophrenia (HCC) Active Problems:   Insomnia   Anxiety state   Tardive dyskinesia     PLAN: Safety and Monitoring:             -- Involuntary admission to inpatient psychiatric unit for safety, stabilization and treatment             -- Daily contact with patient to assess and evaluate symptoms and progress in treatment             --  Patient's case to be discussed in multi-disciplinary team meeting             -- Observation Level : q15 minute checks             -- Vital signs:  q12 hours             -- Precautions: suicide, elopement, and assault   2. Medications:  -Continue Prolixin to 10 mg twice daily for mood and psychosis, effective and well-tolerated -Increase gabapentin 200 mg 3 times daily for anxiety and chronic pain -Continue Zoloft 100 mg daily for depression -Continue Artane 5 mg 3 times daily for EPS -Continue Agitation protocol (Ativan/Benadryl PRN)-See MAR               The risks/benefits/side-effects/alternatives to this medication were discussed in detail with the patient and time was given for questions. The patient consents to medication trial.                -- Metabolic profile and EKG monitoring obtained while on an atypical antipsychotic (BMI: Lipid Panel: HbgA1c: QTc:)                           3. Pertinent labs:Labs Reviewed independently today, 09/08/2022: CMP WNL. CBC WNL. Baseline UA WNL. EKG NSR with QTC-418. Lipid panel with triglycerides at 172. Will need PCP f/u after discharge. , hemoglobin A1C WNL, TSH WNL, Vitamin D & B12 WNL. Tox screen positive for THC, Amphetamines and Opiates-denies Opiate use.        Lab ordered: Repeat CK pending-previous is 511 (9/26)-Will need PCP F/u after discharge   4. Tobacco Use Disorder             -- Nicotine patch 21mg /24 hours ordered             -- Smoking cessation encouraged   5. Group and Therapy: -- Encouraged patient to participate in unit milieu and in  scheduled group therapies                           Patient was counseled repeatedly regarding need to abstain completely from illicit drug use after discharge, he agrees.             -- Short Term Goals: Ability to identify changes in lifestyle to reduce recurrence of condition will improve, Ability to verbalize feelings will improve, Ability to disclose and discuss suicidal ideas, Ability to demonstrate self-control will improve, and Ability to identify and develop effective coping behaviors will improve             -- Long Term Goals: Improvement in symptoms so as ready for discharge   6. Discharge Planning:              -- Social work and case management to assist with discharge planning and identification of hospital follow-up needs prior to discharge             -- Estimated LOS: 5-7 days             -- Discharge Concerns: Need to establish a safety plan; Medication compliance and effectiveness             -- Discharge Goals: Return home with outpatient referrals for mental health follow-up including medication management/psychotherapy    Nicholes Rough, NP 09/09/2022, 1:53 PMPatient ID: Cole Henderson, male   DOB: [birth year], 46 y.o.   MRN:  [id number]  

## 2022-09-09 NOTE — Progress Notes (Signed)
Adult Psychoeducational Group Note  Date:  09/09/2022 Time:  9:07 PM  Group Topic/Focus:  Wrap-Up Group:   The focus of this group is to help patients review their daily goal of treatment and discuss progress on daily workbooks.  Participation Level:  Active  Participation Quality:  Appropriate  Affect:  Appropriate  Cognitive:  Alert  Insight: Appropriate  Engagement in Group:  Engaged  Modes of Intervention:  Discussion  Additional Comments:   Pt states that he had an okay day. He states that he had an issue with staff earlier in the day but was able to have good conversation with his peers through out the day. Pt denies everything and states that he's excited about D/C tomorrow. Pt states that one of his goals t better himself is to work on his anger. He states that he has coping skills that he utilizes to work towards this goal.   Gerhard Perches 09/09/2022, 9:07 PM

## 2022-09-10 DIAGNOSIS — F200 Paranoid schizophrenia: Secondary | ICD-10-CM

## 2022-09-10 MED ORDER — FLUPHENAZINE HCL 10 MG PO TABS: 10.0000 mg | ORAL_TABLET | Freq: Two times a day (BID) | ORAL | 0 refills | Status: DC

## 2022-09-10 MED ORDER — NICOTINE 21 MG/24HR TD PT24: 21.0000 mg | MEDICATED_PATCH | Freq: Every day | TRANSDERMAL | 0 refills | Status: DC

## 2022-09-10 MED ORDER — TRIHEXYPHENIDYL HCL 5 MG PO TABS: 5.0000 mg | ORAL_TABLET | Freq: Three times a day (TID) | ORAL | 0 refills | Status: DC

## 2022-09-10 MED ORDER — GABAPENTIN 100 MG PO CAPS: 200.0000 mg | ORAL_CAPSULE | Freq: Three times a day (TID) | ORAL | 0 refills | Status: DC

## 2022-09-10 MED ORDER — TRAZODONE HCL 50 MG PO TABS: 50.0000 mg | ORAL_TABLET | Freq: Every evening | ORAL | 0 refills | Status: DC | PRN

## 2022-09-10 MED ORDER — SERTRALINE HCL 100 MG PO TABS: 100.0000 mg | ORAL_TABLET | Freq: Every day | ORAL | 0 refills | Status: DC

## 2022-09-10 MED ORDER — HYDROXYZINE HCL 25 MG PO TABS: 25.0000 mg | ORAL_TABLET | Freq: Four times a day (QID) | ORAL | 0 refills | Status: DC | PRN

## 2022-09-10 NOTE — Progress Notes (Signed)
  [facility] Adult Case Management Discharge Plan :  Will you be returning to the same living situation after discharge:  Yes,  living with daughter At discharge, do you have transportation home?: Yes,  patient states that his boss will be picking him up Do you have the ability to pay for your medications: Yes,  provided patient assistance  Release of information consent forms completed and in the chart;  Patient's signature needed at discharge.  Patient to Follow up at:  Follow-up Information     [facility] [place]. Go on 09/12/2022.   Why: 09/12/22 at 3:30 pm, in person Contact information: [place] [address] [phone]                 Next level of care provider has access to [place] and Suicide Prevention discussed: Yes,  done by provider, Hilma Favors., with patient daughter Cloyde Reams     Has patient been referred to the Quitline?: Patient refused referral  Patient has been referred for addiction treatment: Yes- provided at [facility].   [place], LCSW 09/10/2022, 9:40 AM

## 2022-09-10 NOTE — [facility] Suicide Risk Assessment (Addendum)
[place] INPATIENT:  Family/Significant Other Suicide Prevention Education  Suicide Prevention Education:  Contact Attempts: Cloyde Reams [phone] (daughter) (name of family member/significant other) has been identified by the patient as the family member/significant other with whom the patient will be residing, and identified as the person(s) who will aid the patient in the event of a mental health crisis.  With written consent from the patient, two attempts were made to provide suicide prevention education, prior to and/or following the patient's discharge.  We were unsuccessful in providing suicide prevention education.  A suicide education pamphlet was given to the patient to share with family/significant other.  Date and time of first attempt:09/06/2022  / 4pm Date and time of second attempt:09/07/2022 / 2pm  Provider, Nicholes Rough, was able to contact and did safety planning on CSW behalf.  Patient will be returning to stay with daughter and provided safety planning and confirmed no guns/weapons in the house.  Daughter states that patient boss will pick patient up between 11am-12pm.   Delrae Hagey E Makalah Asberry 09/10/2022, 9:18 AM

## 2022-09-10 NOTE — Group Note (Signed)
LCSW Group Therapy Note   Group Date: 04/19/2022 Start Time: 1100 End Time: 1200   Type of Therapy and Topic:  Group Therapy: Wise Mind  Participation Level:  Did not attend  Description of Group: Group members were presented with topic about wise mind, reasonable mind, and emotional mind.  Group members asked to identify situations were they used their wise mind, reasonable mind and emotional mind.  Members asked to identify how behaviors affected them after using parts of their mind and identified alternative behaviors they can use when they are in crisis.    Therapeutic Goals:  1. Patients will identify consequences of using emotional mind and rational mind.  2. Patients will engage in discussion on how they cope when they are in crisis 3.  Patients will discuss coping mechanisms to engage their wise mind     Summary of Patient Progress:    Did not attend  Therapeutic Modalities:  DBT Solution focused therapy    Sharayah Renfrow E Amandy Chubbuck, LCSW   

## 2022-09-10 NOTE — Progress Notes (Addendum)
D:  Patient's self inventory sheet, patient sleeps good, sleep medicine helpful.  Good concentration.  Rated depression, anxiety and  coping skills #6.  Denied withdrawals.  Denied SI.  Physical problems, headaches, worst pain #7 in past 24 hours, Pain L hand.  Pain medicine is helpful.  Goal is not get mad.  Plans to leave hospital.  Does have discharge plans. A:  Medications administered per MD orders.  Emotional support and encouragement given patient. R:  Denied SI and HI, contracts for safety.  Denied A/V hallucinations.  Safety maintained with 15 minute checks.

## 2022-09-10 NOTE — Progress Notes (Signed)
   09/09/22 2100  Psych Admission Type (Psych Patients Only)  Admission Status Involuntary  Psychosocial Assessment  Patient Complaints Anxiety;Insomnia  Eye Contact Fair  Facial Expression Animated  Affect Appropriate to circumstance  Speech Slurred  Interaction Assertive  Motor Activity Slow  Appearance/Hygiene Improved  Behavior Characteristics Appropriate to situation;Cooperative  Mood Pleasant  Thought Process  Coherency WDL  Content WDL  Delusions None reported or observed  Perception WDL  Hallucination None reported or observed  Judgment Poor  Confusion None  Danger to Self  Current suicidal ideation? Denies  Agreement Not to Harm Self Yes  Description of Agreement verbal  Danger to Others  Danger to Others None reported or observed

## 2022-09-10 NOTE — Plan of Care (Signed)
Nurse discussed anxiety, depression and coping skills wih patient
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
## 2022-09-10 NOTE — Progress Notes (Signed)
Adult Psychoeducational Group Note  Date:  09/10/2022 Time:  12:09 PM  Pt did not attend goals group.

## 2022-09-10 NOTE — BHH Suicide Risk Assessment (Signed)
[facility] Discharge Suicide Risk Assessment   Principal Problem: Schizophrenia [facility]) Discharge Diagnoses: Principal Problem:   Schizophrenia ([place]) Active Problems:   Insomnia   Anxiety state   Tardive dyskinesia   Total Time spent with patient: 45 minutes  Reason for admission: Cole Henderson is a 46 yo Caucasian male with a prior mental health history of Schizophrenia, paranoid personality d/o, MDD and methamphetamine abuse, who was taken to the [facility] ER by the [facility] police dept under involuntary status for worsening psychosis & property destruction in the context of Methamphetamine abuse and medication non compliance. Pt was transferred to this [facility] for treatment of his psychosis and mental health stabilization.   PTA Medications:  Medications Prior to Admission  Medication Sig Dispense Refill Last Dose   ARIPiprazole ER (ABILIFY MAINTENA) 400 MG SRER injection Inject 2 mLs (400 mg total) into the muscle every 28 (twenty-eight) days. (Patient not taking: Reported on 09/02/2022) 1 each 11     OLANZapine (ZYPREXA) 5 MG tablet Take 1 tablet (5 mg total) by mouth at bedtime for 7 days. (Patient not taking: Reported on 09/02/2022)         trihexyphenidyl (ARTANE) 5 MG tablet Take 1 tablet (5 mg total) by mouth 2 (two) times daily with a meal. (Patient not taking: Reported on 09/02/2022)       Hospital Course:   During the patient's hospitalization, patient had extensive initial psychiatric evaluation, and follow-up psychiatric evaluations every day.  Psychiatric diagnoses provided upon initial assessment: Principal Problem:   Schizophrenia ([place]) Active Problems:   Insomnia   Anxiety state   Tardive dyskinesia  Patient's psychiatric medications were adjusted on admission: Schizophrenia -Start Prolixin 5 mg daily for psychosis   Depressive symptoms -Start Zoloft 50 mg today (10/3), and increase to 100 mg tomorrow (10/4)   TD -Continue Artane 5 mg and increase to TID before meals    Anxiety -Continue Hydroxyzine 25 mg every 6 hours PRN   Insomnia -Start Trazodone 50 mg nightly PRN for insomnia   Agitation Protocol -Start Benadryl 50 mg PO/IM Q 6 H PRN for agitation -Start Ativan 1 mg PO/IM Q 6 H PRN for agitation  During the hospitalization, other adjustments were made to the patient's psychiatric medication regimen: prolixin was titrated to 10 mg bid for psychosis/AVH, zoloft was titrated to 100 mg daily for depression, gabapentin was added and titrated to 200 mg bid for neuropathic pain/chronic.Artane was continued 5 mg TID  Patient's care was discussed during the interdisciplinary team meeting every day during the hospitalization.  The patient denied having side effects to prescribed psychiatric medication.  Gradually, patient started adjusting to milieu. The patient was evaluated each day by a clinical provider to ascertain response to treatment. Improvement was noted by the patient's report of decreasing symptoms, improved sleep and appetite, affect, medication tolerance, behavior, and participation in unit programming.  Patient was asked each day to complete a self inventory noting mood, mental status, pain, new symptoms, anxiety and concerns.    Symptoms were reported as significantly decreased or resolved completely by discharge.   On day of discharge, patient was evaluated on 09/11/23, the patient reports that their mood is stable. The patient denied having suicidal thoughts for more than 48 hours prior to discharge.  Patient denies having homicidal thoughts.  Patient denies having auditory hallucinations.  Patient denies any visual hallucinations or other symptoms of psychosis. The patient was motivated to continue taking medication with a goal of continued improvement in mental health.  The patient reports their target psychiatric symptoms of psychosis, avh, as well as depression and hopelessness responded well to the psychiatric medications, also hand tremors  and involuntary movements improved with artane restarted, and the patient reports overall benefit other psychiatric hospitalization. Supportive psychotherapy was provided to the patient. The patient also participated in regular group therapy while hospitalized. Coping skills, problem solving as well as relaxation therapies were also part of the unit programming.  Labs were reviewed with the patient, and abnormal results were discussed with the patient.  The patient is able to verbalize their individual safety plan to this provider.  Behavioral Events: none  Restraints: none  Groups:attended and participated  Medications Changes: as above  D/C Medications: as above  Sleep  Sleep:Sleep: Good   Musculoskeletal: Strength & Muscle Tone: within normal limits Gait & Station: normal Patient leans: N/A  Psychiatric Specialty Exam  General Appearance: appears at stated age, fairly dressed and groomed  Behavior: pleasant and cooperative  Psychomotor Activity:No psychomotor agitation or retardation noted   Eye Contact: good Speech: normal amount, tone, volume and latency   Mood: euthymic Affect: congruent, pleasant and interactive  Thought Process: linear, goal directed, no circumstantial or tangential thought process noted, no racing thoughts or flight of ideas Descriptions of Associations: intact Thought Content: Hallucinations: denies AH, VH , does not appear responding to stimuli Delusions: No paranoia or other delusions noted Suicidal Thoughts: denies SI, intention, plan  Homicidal Thoughts: denies HI, intention, plan   Alertness/Orientation: alert and fully oriented  Insight: fair, improved Judgment: fair, improved  Memory: intact  Executive Functions  Concentration: intact  Attention Span: Fair Recall: intact Fund of Knowledge: fair   Community education officer  Concentration: intact Attention Span: Fair Recall: intact Fund of Knowledge: fair   Assets   Assets: Armed forces logistics/support/administrative officer; Housing   Physical Exam: Physical Exam ROS Blood pressure 113/78, pulse (!) 117, temperature 98.3 F (36.8 C), temperature source Oral, resp. rate 20, height 6' (1.829 m), weight 94.1 kg, SpO2 99 %. Body mass index is 28.13 kg/m.  Mental Status Per Nursing Assessment::   On Admission:  NA  Demographic Factors:  Male, Divorced or widowed, Caucasian, Low socioeconomic status, and Unemployed  Loss Factors: NA  Historical Factors: NA  Risk Reduction Factors:   Living with another person, especially a relative  Continued Clinical Symptoms: improved significantly while hospitalized Schizophrenia:   Command hallucinatons Paranoid or undifferentiated type  Cognitive Features That Contribute To Risk:  None    Suicide Risk:  Minimal: No identifiable suicidal ideation.  Patients presenting with no risk factors but with morbid ruminations; may be classified as minimal risk based on the severity of the depressive symptoms   Follow-up Information     [facility], [place]. Go on 09/12/2022.   Why: 09/12/22 at 3:30 pm, in person Contact information: [address] [place] [address] [phone]                 Plan Of Care/Follow-up recommendations:   Discharge recommendations:    Activity: as tolerated  Diet: heart healthy  # It is recommended to the patient to continue psychiatric medications as prescribed, after discharge from the hospital.     # It is recommended to the patient to follow up with your outpatient psychiatric provider and PCP.   # It was discussed with the patient, the impact of alcohol, drugs, tobacco have been there overall psychiatric and medical wellbeing, and total abstinence from substance use was recommended  the patient.ed.   # Prescriptions provided or sent directly to preferred pharmacy at discharge. Patient agreeable to plan. Given opportunity to ask questions. Appears to feel comfortable  with discharge.    # In the event of worsening symptoms, the patient is instructed to call the crisis hotline, 911 and or go to the nearest ED for appropriate evaluation and treatment of symptoms. To follow-up with primary care provider for other medical issues, concerns and or health care needs   # Patient was discharged home with a plan to follow up as noted above.  -Follow-up with outpatient primary care doctor and other specialists -for management of chronic medical disease, including: recommended Op follow up with PCP for chronic neuropathic pain.   Patient agrees with D/C instructions and plan.  The patient received suicide prevention pamphlet:  Yes Belongings returned:  Clothing and Valuables  Total Time Spent in Direct Patient Care:  I personally spent 45 minutes on the unit in direct patient care. The direct patient care time included face-to-face time with the patient, reviewing the patient's chart, communicating with other professionals, and coordinating care. Greater than 50% of this time was spent in counseling or coordinating care with the patient regarding goals of hospitalization, psycho-education, and discharge planning needs.   Olivia Pavelko 09/10/2022, 9:26 AM   Salsabeel Gorelick Winfred Leeds, MD 09/10/2022, 9:26 AM

## 2022-09-10 NOTE — Plan of Care (Addendum)
Patient has taken his medications this morning, attended groups, is in good spirits, and stated he is ready to discharge home.

## 2022-09-10 NOTE — Progress Notes (Signed)
Discharged Note:  Patient discharged with his boss, plans to go to work this afternoon.  Suicide prevention information given and discussed with patient who stated he understood and had no questions.  Denied SI and HI.  Denied A/V hallucinations.  Patient stated he received all his belongings, clothing, toiletries, misc items, etc.  Patient stated he appreciated all assistance received from [facility] staff.  All required discharge information given.

## 2022-10-17 ENCOUNTER — Telehealth (HOSPITAL_COMMUNITY): Payer: Self-pay | Admitting: *Deleted

## 2022-10-17 NOTE — Telephone Encounter (Signed)
Checking Med List

## 2022-11-06 DIAGNOSIS — J40 Bronchitis, not specified as acute or chronic: Secondary | ICD-10-CM | POA: Diagnosis not present

## 2022-11-06 DIAGNOSIS — R051 Acute cough: Secondary | ICD-10-CM | POA: Diagnosis not present

## 2022-11-06 DIAGNOSIS — R059 Cough, unspecified: Secondary | ICD-10-CM | POA: Diagnosis not present

## 2022-11-15 DIAGNOSIS — M25562 Pain in left knee: Secondary | ICD-10-CM | POA: Diagnosis not present

## 2022-11-15 DIAGNOSIS — R051 Acute cough: Secondary | ICD-10-CM | POA: Diagnosis not present

## 2022-11-15 DIAGNOSIS — Y999 Unspecified external cause status: Secondary | ICD-10-CM | POA: Diagnosis not present

## 2022-11-15 DIAGNOSIS — W109XXA Fall (on) (from) unspecified stairs and steps, initial encounter: Secondary | ICD-10-CM | POA: Diagnosis not present

## 2022-11-15 DIAGNOSIS — R059 Cough, unspecified: Secondary | ICD-10-CM | POA: Diagnosis not present

## 2023-01-23 ENCOUNTER — Other Ambulatory Visit: Payer: Self-pay

## 2023-01-23 ENCOUNTER — Emergency Department (HOSPITAL_COMMUNITY): Payer: 59

## 2023-01-23 ENCOUNTER — Inpatient Hospital Stay (HOSPITAL_COMMUNITY)
Admission: EM | Admit: 2023-01-23 | Discharge: 2023-02-01 | DRG: 896 | Disposition: A | Discharge status: Home / Self Care | Payer: 59 | Attending: Family Medicine | Admitting: Family Medicine

## 2023-01-23 ENCOUNTER — Encounter (HOSPITAL_COMMUNITY): Payer: Self-pay | Admitting: Emergency Medicine

## 2023-01-23 DIAGNOSIS — Z1152 Encounter for screening for COVID-19: Secondary | ICD-10-CM

## 2023-01-23 DIAGNOSIS — Z6830 Body mass index (BMI) 30.0-30.9, adult: Secondary | ICD-10-CM

## 2023-01-23 DIAGNOSIS — E441 Mild protein-calorie malnutrition: Secondary | ICD-10-CM | POA: Diagnosis not present

## 2023-01-23 DIAGNOSIS — E876 Hypokalemia: Secondary | ICD-10-CM | POA: Diagnosis not present

## 2023-01-23 DIAGNOSIS — Z91013 Allergy to seafood: Secondary | ICD-10-CM

## 2023-01-23 DIAGNOSIS — R34 Anuria and oliguria: Secondary | ICD-10-CM

## 2023-01-23 DIAGNOSIS — F10231 Alcohol dependence with withdrawal delirium: Principal | ICD-10-CM | POA: Diagnosis present

## 2023-01-23 DIAGNOSIS — Z79899 Other long term (current) drug therapy: Secondary | ICD-10-CM

## 2023-01-23 DIAGNOSIS — F10931 Alcohol use, unspecified with withdrawal delirium: Secondary | ICD-10-CM | POA: Diagnosis not present

## 2023-01-23 DIAGNOSIS — I10 Essential (primary) hypertension: Secondary | ICD-10-CM

## 2023-01-23 DIAGNOSIS — E871 Hypo-osmolality and hyponatremia: Secondary | ICD-10-CM | POA: Diagnosis not present

## 2023-01-23 DIAGNOSIS — E162 Hypoglycemia, unspecified: Secondary | ICD-10-CM

## 2023-01-23 DIAGNOSIS — R1311 Dysphagia, oral phase: Secondary | ICD-10-CM | POA: Diagnosis present

## 2023-01-23 DIAGNOSIS — Z781 Physical restraint status: Secondary | ICD-10-CM

## 2023-01-23 DIAGNOSIS — Z87891 Personal history of nicotine dependence: Secondary | ICD-10-CM

## 2023-01-23 DIAGNOSIS — F205 Residual schizophrenia: Secondary | ICD-10-CM | POA: Diagnosis present

## 2023-01-23 DIAGNOSIS — G928 Other toxic encephalopathy: Secondary | ICD-10-CM | POA: Diagnosis present

## 2023-01-23 DIAGNOSIS — B370 Candidal stomatitis: Secondary | ICD-10-CM | POA: Diagnosis present

## 2023-01-23 DIAGNOSIS — R739 Hyperglycemia, unspecified: Secondary | ICD-10-CM

## 2023-01-23 DIAGNOSIS — Z888 Allergy status to other drugs, medicaments and biological substances status: Secondary | ICD-10-CM

## 2023-01-23 DIAGNOSIS — E860 Dehydration: Secondary | ICD-10-CM | POA: Diagnosis present

## 2023-01-23 DIAGNOSIS — F10239 Alcohol dependence with withdrawal, unspecified: Secondary | ICD-10-CM | POA: Diagnosis not present

## 2023-01-23 DIAGNOSIS — Z91018 Allergy to other foods: Secondary | ICD-10-CM

## 2023-01-23 DIAGNOSIS — Y900 Blood alcohol level of less than 20 mg/100 ml: Secondary | ICD-10-CM | POA: Diagnosis present

## 2023-01-23 LAB — RESP PANEL BY RT-PCR (RSV, FLU A&B, COVID)  RVPGX2
Influenza A by PCR: NEGATIVE
Influenza B by PCR: NEGATIVE
Resp Syncytial Virus by PCR: NEGATIVE
SARS Coronavirus 2 by RT PCR: NEGATIVE

## 2023-01-23 LAB — CBC WITH DIFFERENTIAL/PLATELET
Abs Immature Granulocytes: 0.03 10*3/uL (ref 0.00–0.07)
Basophils Absolute: 0.1 10*3/uL (ref 0.0–0.1)
Basophils Relative: 1 %
Eosinophils Absolute: 0.6 10*3/uL — ABNORMAL HIGH (ref 0.0–0.5)
Eosinophils Relative: 6 %
HCT: 36.1 % — ABNORMAL LOW (ref 39.0–52.0)
Hemoglobin: 11.6 g/dL — ABNORMAL LOW (ref 13.0–17.0)
Immature Granulocytes: 0 %
Lymphocytes Relative: 20 %
Lymphs Abs: 2 10*3/uL (ref 0.7–4.0)
MCH: 27 pg (ref 26.0–34.0)
MCHC: 32.1 g/dL (ref 30.0–36.0)
MCV: 84 fL (ref 80.0–100.0)
Monocytes Absolute: 0.9 10*3/uL (ref 0.1–1.0)
Monocytes Relative: 9 %
Neutro Abs: 6.2 10*3/uL (ref 1.7–7.7)
Neutrophils Relative %: 64 %
Platelets: 260 10*3/uL (ref 150–400)
RBC: 4.3 MIL/uL (ref 4.22–5.81)
RDW: 15.2 % (ref 11.5–15.5)
WBC: 9.8 10*3/uL (ref 4.0–10.5)
nRBC: 0 % (ref 0.0–0.2)

## 2023-01-23 LAB — COMPREHENSIVE METABOLIC PANEL
ALT: 29 U/L (ref 0–44)
AST: 38 U/L (ref 15–41)
Albumin: 3.7 g/dL (ref 3.5–5.0)
Alkaline Phosphatase: 58 U/L (ref 38–126)
Anion gap: 11 (ref 5–15)
BUN: 23 mg/dL — ABNORMAL HIGH (ref 6–20)
CO2: 22 mmol/L (ref 22–32)
Calcium: 9 mg/dL (ref 8.9–10.3)
Chloride: 103 mmol/L (ref 98–111)
Creatinine, Ser: 1.16 mg/dL (ref 0.61–1.24)
GFR, Estimated: >60 mL/min (ref >60)
Glucose, Bld: 151 mg/dL — ABNORMAL HIGH (ref 70–99)
Potassium: 3.6 mmol/L (ref 3.5–5.1)
Sodium: 136 mmol/L (ref 135–145)
Total Bilirubin: 0.9 mg/dL (ref 0.3–1.2)
Total Protein: 6.3 g/dL — ABNORMAL LOW (ref 6.5–8.1)

## 2023-01-23 LAB — ETHANOL: Alcohol, Ethyl (B): <10 mg/dL (ref <10)

## 2023-01-23 LAB — SALICYLATE LEVEL: Salicylate Lvl: <7 mg/dL — ABNORMAL LOW (ref 7.0–30.0)

## 2023-01-23 LAB — ACETAMINOPHEN LEVEL: Acetaminophen (Tylenol), Serum: <10 ug/mL — ABNORMAL LOW (ref 10–30)

## 2023-01-23 MED ORDER — LORAZEPAM 2 MG/ML IJ SOLN
4.0000 mg | Freq: Once | INTRAMUSCULAR | Status: AC
  Administered 2023-01-23: 4 mg via INTRAVENOUS
  Filled 2023-01-23: qty 2

## 2023-01-23 MED ORDER — DEXMEDETOMIDINE HCL IN NACL 400 MCG/100ML IV SOLN
0.0000 ug/kg/h | INTRAVENOUS | Status: DC
  Administered 2023-01-23: 0.4 ug/kg/h via INTRAVENOUS
  Filled 2023-01-23: qty 100

## 2023-01-23 MED ORDER — THIAMINE MONONITRATE 100 MG PO TABS
100.0000 mg | ORAL_TABLET | Freq: Every day | ORAL | Status: DC
  Administered 2023-01-23 – 2023-01-26 (×3): 100 mg via ORAL
  Filled 2023-01-23 (×4): qty 1

## 2023-01-23 MED ORDER — THIAMINE HCL 100 MG/ML IJ SOLN
100.0000 mg | Freq: Every day | INTRAMUSCULAR | Status: DC
  Administered 2023-01-24: 100 mg via INTRAVENOUS
  Filled 2023-01-23: qty 2

## 2023-01-23 MED ORDER — LORAZEPAM 1 MG PO TABS: 2.0000 mg | ORAL_TABLET | Freq: Four times a day (QID) | ORAL | Status: DC

## 2023-01-23 MED ORDER — LORAZEPAM 1 MG PO TABS: 2.0000 mg | ORAL_TABLET | Freq: Two times a day (BID) | ORAL | Status: DC

## 2023-01-23 MED ORDER — PHENOBARBITAL SODIUM 130 MG/ML IJ SOLN
130.0000 mg | Freq: Once | INTRAMUSCULAR | Status: AC
  Administered 2023-01-23: 130 mg via INTRAVENOUS
  Filled 2023-01-23: qty 1

## 2023-01-23 MED ORDER — LORAZEPAM 2 MG/ML IJ SOLN
0.0000 mg | Freq: Four times a day (QID) | INTRAMUSCULAR | Status: DC
  Administered 2023-01-23: 4 mg via INTRAVENOUS
  Filled 2023-01-23: qty 2

## 2023-01-23 MED ORDER — LORAZEPAM 1 MG PO TABS: 0.0000 mg | ORAL_TABLET | Freq: Four times a day (QID) | ORAL | Status: DC

## 2023-01-23 MED ORDER — LORAZEPAM 1 MG PO TABS: 0.0000 mg | ORAL_TABLET | Freq: Two times a day (BID) | ORAL | Status: DC

## 2023-01-23 MED ORDER — LORAZEPAM 2 MG/ML IJ SOLN: 0.0000 mg | Freq: Two times a day (BID) | INTRAMUSCULAR | Status: DC

## 2023-01-23 MED ORDER — LORAZEPAM 1 MG PO TABS
0.0000 mg | ORAL_TABLET | Freq: Four times a day (QID) | ORAL | Status: DC
  Administered 2023-01-23: 3 mg via ORAL
  Filled 2023-01-23: qty 3

## 2023-01-23 NOTE — ED Notes (Signed)
Pt with sob, raspy sounding. PA at bedside, oncoming provider at bedside also. 2L  placed.

## 2023-01-23 NOTE — ED Triage Notes (Signed)
Pt c/o cough for a couple days and alcohol withdrawal. Pt states he drinks gallon of whiskey every day and has not had a drink in the past 16 hours. Hx of alcoholic seizures.

## 2023-01-23 NOTE — ED Notes (Signed)
Pt actively hallucinating, pulling at wallpaper and sheets on the bed. Pt moved to room after continually walking around unit, redirectable at times. Pt reports hx of seizures with alcohol withdrawal.

## 2023-01-23 NOTE — ED Provider Triage Note (Signed)
Emergency Medicine Provider Triage Evaluation Note  Cole Henderson , a 46 y.o. male  was evaluated in triage.  Pt complains of with a past medical history of schizophrenia and alcohol abuse who presents for bad cough.  He states this has been going on for 1 week.  Patient states that he also needs to see psychiatry because "the [employer] damn democrats took the medicine I used to take off formulary."  And he wants to be admitted to get back on his psych meds.  Mitts to daily alcohol abuse.  Review of Systems  Positive: Cough, hoarseness Negative: Fever  Physical Exam  BP (!) 141/96   Pulse (!) 115   Temp 98.7 F (37.1 C)   Resp (!) 24   Ht 5' 10"$  (1.778 m)   Wt 102.1 kg   SpO2 99%   BMI 32.28 kg/m  Gen:   Awake, no distress   Resp:  Normal effort  MSK:   Moves extremities without difficulty  Other:    Medical Decision Making  Medically screening exam initiated at 6:53 PM.  Appropriate orders placed.  Cole Henderson was informed that the remainder of the evaluation will be completed by another provider, this initial triage assessment does not replace that evaluation, and the importance of remaining in the ED until their evaluation is complete.     Cole Mail, PA-C 01/23/23 [phone]

## 2023-01-23 NOTE — ED Notes (Signed)
Cardama made aware of pts status. RT at the bedside

## 2023-01-23 NOTE — ED Notes (Signed)
Cardama and [place] PA at the bedsidde

## 2023-01-23 NOTE — ED Provider Notes (Cosign Needed Addendum)
[facility] Provider Note   CSN: [id number] Arrival date & time: 01/23/23  1704     History  Chief Complaint  Patient presents with   Withdrawal    Cole Henderson is a 47 y.o. male with a past medical history of schizophrenia, amphetamine abuse and alcohol use disorder presenting today with concern for withdrawals.  He reportedly drinks a gallon of whiskey every day and has not had anything to drink today.  Endorses visual hallucinations however is unable to elaborate.  He also me that he has had a cough for couple of days and it has caused his voice to be hoarse.  HPI     Home Medications Prior to Admission medications   Medication Sig Start Date End Date Taking? Authorizing Provider  fluPHENAZine (PROLIXIN) 10 MG tablet Take 1 tablet (10 mg total) by mouth 2 (two) times daily. 09/10/22   Dian Situ, MD  gabapentin (NEURONTIN) 100 MG capsule Take 2 capsules (200 mg total) by mouth 3 (three) times daily. 09/10/22   Dian Situ, MD  hydrOXYzine (ATARAX) 25 MG tablet Take 1 tablet (25 mg total) by mouth every 6 (six) hours as needed for anxiety. 09/10/22   Dian Situ, MD  nicotine (NICODERM CQ - DOSED IN MG/24 HOURS) 21 mg/24hr patch Place 1 patch (21 mg total) onto the skin daily. 09/11/22   Dian Situ, MD  sertraline (ZOLOFT) 100 MG tablet Take 1 tablet (100 mg total) by mouth daily. 09/11/22   Dian Situ, MD  traZODone (DESYREL) 50 MG tablet Take 1 tablet (50 mg total) by mouth at bedtime as needed for sleep. 09/10/22   Dian Situ, MD  trihexyphenidyl (ARTANE) 5 MG tablet Take 1 tablet (5 mg total) by mouth 3 (three) times daily. 09/10/22   Dian Situ, MD      Allergies    Benztropine mesylate, Fish allergy, Haloperidol, Haloperidol lactate, Shellfish allergy, Cephalexin, Cogentin [benztropine], Ingrezza [valbenazine tosylate], Risperidone, Risperidone and related, Strawberry extract, and Geodon [ziprasidone]    Review of  Systems   Review of Systems  Physical Exam Updated Vital Signs BP (!) 131/102   Pulse (!) 103   Temp 98.7 F (37.1 C)   Resp (!) 24   Ht 5' 10"$  (1.778 m)   Wt 102.1 kg   SpO2 99%   BMI 32.28 kg/m  Physical Exam Vitals and nursing note reviewed.  Constitutional:      General: He is not in acute distress.    Appearance: Normal appearance.  HENT:     Head: Normocephalic and atraumatic.  Eyes:     General: No scleral icterus.    Conjunctiva/sclera: Conjunctivae normal.  Cardiovascular:     Rate and Rhythm: Regular rhythm. Tachycardia present.  Pulmonary:     Effort: Pulmonary effort is normal. No respiratory distress.  Skin:    General: Skin is warm and dry.     Findings: No rash.  Neurological:     Mental Status: He is alert.     Comments: Oriented to self and location  Psychiatric:        Mood and Affect: Mood normal.     ED Results / Procedures / Treatments   Labs (all labs ordered are listed, but only abnormal results are displayed) Labs Reviewed  COMPREHENSIVE METABOLIC PANEL - Abnormal; Notable for the following components:      Result Value   Glucose, Bld 151 (*)    BUN 23 (*)    Total Protein  6.3 (*)    All other components within normal limits  CBC WITH DIFFERENTIAL/PLATELET - Abnormal; Notable for the following components:   Hemoglobin 11.6 (*)    HCT 36.1 (*)    Eosinophils Absolute 0.6 (*)    All other components within normal limits  SALICYLATE LEVEL - Abnormal; Notable for the following components:   Salicylate Lvl [id number] (*)    All other components within normal limits  ACETAMINOPHEN LEVEL - Abnormal; Notable for the following components:   Acetaminophen (Tylenol), Serum <10 (*)    All other components within normal limits  RESP PANEL BY RT-PCR (RSV, FLU A&B, COVID)  [id number]  ETHANOL  RAPID URINE DRUG SCREEN, HOSP PERFORMED    EKG None  Radiology DG Chest 2 View  Result Date: 01/23/2023 CLINICAL DATA:  Withdrawal. EXAM: CHEST - 2 VIEW  COMPARISON:  Chest radiograph dated 11/15/2022. FINDINGS: The heart size and mediastinal contours are within normal limits. Both lungs are clear. The visualized skeletal structures are unremarkable. IMPRESSION: No active cardiopulmonary disease. Electronically Signed   By: Anner Crete M.D.   On: 01/23/2023 19:12    Procedures .Critical Care  Performed by: Rhae Hammock, PA-C Authorized by: Rhae Hammock, PA-C   Critical care provider statement:    Critical care time (minutes):  60   Critical care time was exclusive of:  Separately billable procedures and treating other patients   Critical care was necessary to treat or prevent imminent or life-threatening deterioration of the following conditions:  Respiratory failure and CNS failure or compromise   Critical care was time spent personally by me on the following activities:  Discussions with consultants, development of treatment plan with patient or surrogate, discussions with primary provider, evaluation of patient's response to treatment, obtaining history from patient or surrogate, review of old charts, re-evaluation of patient's condition, pulse oximetry, ordering and review of laboratory studies, ordering and performing treatments and interventions and ordering and review of radiographic studies   I assumed direction of critical care for this patient from another provider in my specialty: no     Care discussed with: admitting provider      Medications Ordered in ED Medications  LORazepam (ATIVAN) tablet 2 mg ( Oral See Alternative 01/23/23 2105)    Or  LORazepam (ATIVAN) tablet 0-4 mg (3 mg Oral Given 01/23/23 2105)  LORazepam (ATIVAN) tablet 2 mg (has no administration in time range)    Or  LORazepam (ATIVAN) tablet 0-4 mg (has no administration in time range)  thiamine (VITAMIN B1) tablet 100 mg (100 mg Oral Given 01/23/23 2106)    Or  thiamine (VITAMIN B1) injection 100 mg ( Intravenous See Alternative 01/23/23 2106)     ED Course/ Medical Decision Making/ A&P Clinical Course as of 01/23/23 2348  Tue Jan 23, 2023  2229 Patient is agitated in the emergency department.  Appears to be responding to internal stimuli.  He is rolling on the hallway floor.  Tremulous.  [MR]  2252 Spoke with Dr. Lynetta Mare with critical care.  Recommends 130 of phenobarb and he will come and see the patient in consult [MR]  2326 Spoke with Dr. Lynetta Mare about patient's worsening status.  Which apparently recommends Precedex, no intubation and every 15 minute Ativan. Rn made aware [MR]    Clinical Course User Index [MR] Kester Stimpson A, PA-C  Medical Decision Making Risk OTC drugs. Prescription drug management. Decision regarding hospitalization.   47 year old male presenting today with concern for alcohol withdrawal.  He reports that he drinks nearly a gallon of liquor daily but has not had anything to drink today.  Also says that he is out of his psychiatric medications.  Physical exam: Tremulous, mildly tachycardic, appears to be responding to internal stimuli.  Actively hallucinating.  Workup: Benign.  Ethanol negative  Imaging: X-ray ordered in triage.  I viewed and interpreted this and agree that there are no signs of pneumonia.  MDM/disposition: 47 year old male presenting today with concern for alcohol withdrawals.  Reports that he has had several alcohol withdrawal seizures.  Has not had anything to drink for over 24 hours.  He is unable to provide a substantial history to me.  Most of my history has been piece together by previous providers and nursing staff.  Patient appears to be agitated with worsening alcohol withdrawals.  He was given 3 of Ativan which does not seem to have helped him.  Nursing will be giving more Ativan at this time.  I suspect the patient requires admission for alcohol withdrawals.    Spoke with Dr. Lynetta Mare with critical care who recommends phenobarbital and says  that he will come and see the patient.  Patient continues to decline with phenobarb.  Spoke with critical care given and Dr. Lynetta Mare that the Precedex is appropriate and that he will come and see the patient.  My attending, Dr. Leonette Monarch, also saw the patient and both attendings do not believe the patient needs to be intubated at this time.  Placed on nasal cannula oxygen to keep saturations above 91.   At this time patient is handed off to [place], Suella Broad.  She will follow-up with critical care about patient's ultimate disposition.  Final Clinical Impression(s) / ED Diagnoses Final diagnoses:  Alcohol withdrawal syndrome, with delirium [facility])    Rx / DC Orders ED Discharge Orders     None         Zian Mohamed, [place], PA-C 01/23/23 2355    Rhae Hammock, PA-C 01/23/23 2358    Fatima Blank, MD 01/24/23 1753

## 2023-01-24 DIAGNOSIS — F205 Residual schizophrenia: Secondary | ICD-10-CM | POA: Diagnosis not present

## 2023-01-24 DIAGNOSIS — E860 Dehydration: Secondary | ICD-10-CM | POA: Diagnosis not present

## 2023-01-24 DIAGNOSIS — E876 Hypokalemia: Secondary | ICD-10-CM | POA: Diagnosis not present

## 2023-01-24 DIAGNOSIS — E871 Hypo-osmolality and hyponatremia: Secondary | ICD-10-CM | POA: Diagnosis not present

## 2023-01-24 DIAGNOSIS — R1311 Dysphagia, oral phase: Secondary | ICD-10-CM | POA: Diagnosis not present

## 2023-01-24 DIAGNOSIS — B370 Candidal stomatitis: Secondary | ICD-10-CM | POA: Diagnosis not present

## 2023-01-24 DIAGNOSIS — Z1152 Encounter for screening for COVID-19: Secondary | ICD-10-CM | POA: Diagnosis not present

## 2023-01-24 DIAGNOSIS — G928 Other toxic encephalopathy: Secondary | ICD-10-CM | POA: Diagnosis not present

## 2023-01-24 DIAGNOSIS — Z4659 Encounter for fitting and adjustment of other gastrointestinal appliance and device: Secondary | ICD-10-CM | POA: Diagnosis not present

## 2023-01-24 DIAGNOSIS — E162 Hypoglycemia, unspecified: Secondary | ICD-10-CM | POA: Diagnosis not present

## 2023-01-24 DIAGNOSIS — Z87891 Personal history of nicotine dependence: Secondary | ICD-10-CM | POA: Diagnosis not present

## 2023-01-24 DIAGNOSIS — E441 Mild protein-calorie malnutrition: Secondary | ICD-10-CM | POA: Diagnosis not present

## 2023-01-24 DIAGNOSIS — Z91013 Allergy to seafood: Secondary | ICD-10-CM | POA: Diagnosis not present

## 2023-01-24 DIAGNOSIS — Y900 Blood alcohol level of less than 20 mg/100 ml: Secondary | ICD-10-CM | POA: Diagnosis not present

## 2023-01-24 DIAGNOSIS — F10231 Alcohol dependence with withdrawal delirium: Secondary | ICD-10-CM | POA: Diagnosis not present

## 2023-01-24 DIAGNOSIS — Z781 Physical restraint status: Secondary | ICD-10-CM | POA: Diagnosis not present

## 2023-01-24 DIAGNOSIS — Z888 Allergy status to other drugs, medicaments and biological substances status: Secondary | ICD-10-CM | POA: Diagnosis not present

## 2023-01-24 DIAGNOSIS — F10931 Alcohol use, unspecified with withdrawal delirium: Secondary | ICD-10-CM | POA: Diagnosis present

## 2023-01-24 DIAGNOSIS — R739 Hyperglycemia, unspecified: Secondary | ICD-10-CM

## 2023-01-24 DIAGNOSIS — Z6830 Body mass index (BMI) 30.0-30.9, adult: Secondary | ICD-10-CM | POA: Diagnosis not present

## 2023-01-24 DIAGNOSIS — Z79899 Other long term (current) drug therapy: Secondary | ICD-10-CM | POA: Diagnosis not present

## 2023-01-24 DIAGNOSIS — Z91018 Allergy to other foods: Secondary | ICD-10-CM | POA: Diagnosis not present

## 2023-01-24 DIAGNOSIS — I10 Essential (primary) hypertension: Secondary | ICD-10-CM | POA: Diagnosis not present

## 2023-01-24 LAB — RAPID URINE DRUG SCREEN, HOSP PERFORMED
Amphetamines: POSITIVE — AB
Barbiturates: POSITIVE — AB
Benzodiazepines: POSITIVE — AB
Cocaine: NOT DETECTED
Opiates: NOT DETECTED
Tetrahydrocannabinol: NOT DETECTED

## 2023-01-24 LAB — POCT I-STAT 7, (LYTES, BLD GAS, ICA,H+H)
Acid-base deficit: 2 mmol/L (ref 0.0–2.0)
Bicarbonate: 24.6 mmol/L (ref 20.0–28.0)
Calcium, Ion: 1.27 mmol/L (ref 1.15–1.40)
HCT: 32 % — ABNORMAL LOW (ref 39.0–52.0)
Hemoglobin: 10.9 g/dL — ABNORMAL LOW (ref 13.0–17.0)
O2 Saturation: 96 %
Patient temperature: 97.9
Potassium: 3.6 mmol/L (ref 3.5–5.1)
Sodium: 138 mmol/L (ref 135–145)
TCO2: 26 mmol/L (ref 22–32)
pCO2 arterial: 47.9 mmHg (ref 32–48)
pH, Arterial: 7.316 — ABNORMAL LOW (ref 7.35–7.45)
pO2, Arterial: 87 mmHg (ref 83–108)

## 2023-01-24 LAB — URINALYSIS, ROUTINE W REFLEX MICROSCOPIC
Bilirubin Urine: NEGATIVE
Glucose, UA: NEGATIVE mg/dL
Hgb urine dipstick: NEGATIVE
Ketones, ur: 5 mg/dL — AB
Leukocytes,Ua: NEGATIVE
Nitrite: NEGATIVE
Protein, ur: NEGATIVE mg/dL
Specific Gravity, Urine: 1.029 (ref 1.005–1.030)
pH: 5 (ref 5.0–8.0)

## 2023-01-24 LAB — CBC
HCT: 34.1 % — ABNORMAL LOW (ref 39.0–52.0)
Hemoglobin: 11 g/dL — ABNORMAL LOW (ref 13.0–17.0)
MCH: 27.1 pg (ref 26.0–34.0)
MCHC: 32.3 g/dL (ref 30.0–36.0)
MCV: 84 fL (ref 80.0–100.0)
Platelets: 230 10*3/uL (ref 150–400)
RBC: 4.06 MIL/uL — ABNORMAL LOW (ref 4.22–5.81)
RDW: 15.1 % (ref 11.5–15.5)
WBC: 7.7 10*3/uL (ref 4.0–10.5)
nRBC: 0 % (ref 0.0–0.2)

## 2023-01-24 LAB — GLUCOSE, CAPILLARY
Glucose-Capillary: 110 mg/dL — ABNORMAL HIGH (ref 70–99)
Glucose-Capillary: 117 mg/dL — ABNORMAL HIGH (ref 70–99)
Glucose-Capillary: 128 mg/dL — ABNORMAL HIGH (ref 70–99)
Glucose-Capillary: 134 mg/dL — ABNORMAL HIGH (ref 70–99)
Glucose-Capillary: 185 mg/dL — ABNORMAL HIGH (ref 70–99)
Glucose-Capillary: 215 mg/dL — ABNORMAL HIGH (ref 70–99)
Glucose-Capillary: 85 mg/dL (ref 70–99)

## 2023-01-24 LAB — COMPREHENSIVE METABOLIC PANEL
ALT: 27 U/L (ref 0–44)
AST: 39 U/L (ref 15–41)
Albumin: 3.4 g/dL — ABNORMAL LOW (ref 3.5–5.0)
Alkaline Phosphatase: 56 U/L (ref 38–126)
Anion gap: 10 (ref 5–15)
BUN: 19 mg/dL (ref 6–20)
CO2: 24 mmol/L (ref 22–32)
Calcium: 8.5 mg/dL — ABNORMAL LOW (ref 8.9–10.3)
Chloride: 103 mmol/L (ref 98–111)
Creatinine, Ser: 0.92 mg/dL (ref 0.61–1.24)
GFR, Estimated: >60 mL/min (ref >60)
Glucose, Bld: 110 mg/dL — ABNORMAL HIGH (ref 70–99)
Potassium: 3.4 mmol/L — ABNORMAL LOW (ref 3.5–5.1)
Sodium: 137 mmol/L (ref 135–145)
Total Bilirubin: 1.3 mg/dL — ABNORMAL HIGH (ref 0.3–1.2)
Total Protein: 5.9 g/dL — ABNORMAL LOW (ref 6.5–8.1)

## 2023-01-24 LAB — HIV ANTIBODY (ROUTINE TESTING W REFLEX): HIV Screen 4th Generation wRfx: NONREACTIVE

## 2023-01-24 LAB — PHOSPHORUS: Phosphorus: 4.1 mg/dL (ref 2.5–4.6)

## 2023-01-24 LAB — MAGNESIUM: Magnesium: 2 mg/dL (ref 1.7–2.4)

## 2023-01-24 LAB — MRSA NEXT GEN BY PCR, NASAL: MRSA by PCR Next Gen: NOT DETECTED

## 2023-01-24 LAB — HEMOGLOBIN A1C
Hgb A1c MFr Bld: 5.5 % (ref 4.8–5.6)
Mean Plasma Glucose: 111.15 mg/dL

## 2023-01-24 MED ORDER — DEXMEDETOMIDINE HCL IN NACL 400 MCG/100ML IV SOLN
0.2000 ug/kg/h | INTRAVENOUS | Status: DC
  Administered 2023-01-24: 0.6 ug/kg/h via INTRAVENOUS
  Administered 2023-01-24: 0.5 ug/kg/h via INTRAVENOUS
  Administered 2023-01-25: 0.3 ug/kg/h via INTRAVENOUS
  Administered 2023-01-25: 0.2 ug/kg/h via INTRAVENOUS
  Filled 2023-01-24 (×2): qty 100

## 2023-01-24 MED ORDER — DOCUSATE SODIUM 100 MG PO CAPS: 100.0000 mg | ORAL_CAPSULE | Freq: Two times a day (BID) | ORAL | Status: DC | PRN

## 2023-01-24 MED ORDER — PHENOBARBITAL SODIUM 65 MG/ML IJ SOLN: 32.5000 mg | Freq: Three times a day (TID) | INTRAMUSCULAR | Status: DC

## 2023-01-24 MED ORDER — INFUVITE ADULT IV SOLN
Freq: Once | INTRAVENOUS | Status: AC
  Filled 2023-01-24: qty 10

## 2023-01-24 MED ORDER — PHENOBARBITAL SODIUM 65 MG/ML IJ SOLN: 65.0000 mg | Freq: Three times a day (TID) | INTRAMUSCULAR | Status: DC

## 2023-01-24 MED ORDER — STERILE WATER FOR INJECTION IJ SOLN
INTRAMUSCULAR | Status: AC
  Administered 2023-01-24: 10 mL
  Filled 2023-01-24: qty 10

## 2023-01-24 MED ORDER — POTASSIUM CHLORIDE 10 MEQ/100ML IV SOLN
10.0000 meq | INTRAVENOUS | Status: AC
  Administered 2023-01-24 (×4): 10 meq via INTRAVENOUS
  Filled 2023-01-24 (×4): qty 100

## 2023-01-24 MED ORDER — FOLIC ACID 5 MG/ML IJ SOLN
1.0000 mg | Freq: Every day | INTRAMUSCULAR | Status: DC
  Administered 2023-01-24 – 2023-01-25 (×2): 1 mg via INTRAVENOUS
  Filled 2023-01-24 (×2): qty 0.2

## 2023-01-24 MED ORDER — POLYETHYLENE GLYCOL 3350 17 G PO PACK: 17.0000 g | PACK | Freq: Every day | ORAL | Status: DC | PRN

## 2023-01-24 MED ORDER — ORAL CARE MOUTH RINSE: 15.0000 mL | OROMUCOSAL | Status: DC | PRN

## 2023-01-24 MED ORDER — ENOXAPARIN SODIUM 40 MG/0.4ML IJ SOSY
40.0000 mg | PREFILLED_SYRINGE | INTRAMUSCULAR | Status: DC
  Administered 2023-01-24 – 2023-01-30 (×8): 40 mg via SUBCUTANEOUS
  Filled 2023-01-24 (×9): qty 0.4

## 2023-01-24 MED ORDER — LORAZEPAM 2 MG/ML IJ SOLN: 1.0000 mg | INTRAMUSCULAR | Status: DC | PRN

## 2023-01-24 MED ORDER — ONDANSETRON HCL 4 MG/2ML IJ SOLN: 4.0000 mg | Freq: Four times a day (QID) | INTRAMUSCULAR | Status: DC | PRN

## 2023-01-24 MED ORDER — OLANZAPINE 10 MG IM SOLR
10.0000 mg | Freq: Once | INTRAMUSCULAR | Status: AC
  Administered 2023-01-24: 10 mg via INTRAMUSCULAR
  Filled 2023-01-24: qty 10

## 2023-01-24 MED ORDER — LACTATED RINGERS IV BOLUS
1000.0000 mL | Freq: Once | INTRAVENOUS | Status: AC
  Administered 2023-01-24: 1000 mL via INTRAVENOUS

## 2023-01-24 MED ORDER — PHENOBARBITAL SODIUM 130 MG/ML IJ SOLN
97.5000 mg | Freq: Three times a day (TID) | INTRAMUSCULAR | Status: DC
  Administered 2023-01-24 – 2023-01-25 (×4): 97.5 mg via INTRAVENOUS
  Filled 2023-01-24 (×4): qty 1

## 2023-01-24 MED ORDER — NICOTINE 21 MG/24HR TD PT24
21.0000 mg | MEDICATED_PATCH | Freq: Every day | TRANSDERMAL | Status: DC
  Administered 2023-01-24 – 2023-02-01 (×9): 21 mg via TRANSDERMAL
  Filled 2023-01-24 (×8): qty 1

## 2023-01-24 MED ORDER — CHLORHEXIDINE GLUCONATE CLOTH 2 % EX PADS
6.0000 | MEDICATED_PAD | Freq: Every day | CUTANEOUS | Status: DC
  Administered 2023-01-24 – 2023-01-31 (×9): 6 via TOPICAL

## 2023-01-24 NOTE — ED Notes (Signed)
Critical care at the bedside

## 2023-01-24 NOTE — H&P (Signed)
   NAME:  Cole Henderson, MRN:  [id number], DOB:  May 13, 1976, LOS: 0 ADMISSION DATE:  01/23/2023, CONSULTATION DATE:  01/23/2023 REFERRING MD:  Leonette Monarch ED [facility], CHIEF COMPLAINT:  alcohol withdrawal   History of Present Illness:  47 year old man with severe alcohol withdrawal with delirium tremens. Normally drinks 1 gallon of whiskey but none today. Reported visual hallucinations.  Pertinent  Medical History   Past Medical History:  Diagnosis Date   Drug abuse ([place])    Hypertension    Schizophrenia ([place])    Tardive dyskinesia    Significant Hospital Events: Including procedures, antibiotic start and stop dates in addition to other pertinent events   2/21 admitted with delirium tremens.   Interim History / Subjective:  Worsening agitation and confusion despite 2m Ativan and phenobarbital load.   Objective   Blood pressure (!) 146/93, pulse 99, temperature 98.5 F (36.9 C), temperature source Oral, resp. rate (!) 33, height 5' 10"$  (1.778 m), weight 102.1 kg, SpO2 99 %.       No intake or output data in the 24 hours ending 01/24/23 0026 Filed Weights   01/23/23 1744  Weight: 102.1 kg    Examination: General: ill kempt, agitation.  HENT: sonorous breathing. No icterus.  Lungs: clear  Cardiovascular: tachycardic, dry mucosae  Abdomen: soft Extremities: no edema Neuro: agitation, non focal, no verbal response but not tremulous GU: no foley  Ancillary tests recently reviewed  Unremarkable Ethoh <10 Assessment & Plan:  Critically ill due to alcohol withdrawal with delirium tremens Chronic alcohol abuse Schizophrenia with prior auditory hallucinations.   Plan: - Dexmedetomidine infusion - Continue symptom triggered lorazepam - Phenobarbital taper - Monitor for airway compromise - IV fluids.  Best Practice (right click and "Reselect all SmartList Selections" daily)   Diet/type: NPO w/ oral meds DVT prophylaxis: LMWH GI prophylaxis: N/A Lines: N/A Foley:  N/A Code  Status:  full code Last date of multidisciplinary goals of care discussion [No family present]  CRITICAL CARE Performed by: RKipp Brood  Total critical care time: 40 minutes  Critical care time was exclusive of separately billable procedures and treating other patients.  Critical care was necessary to treat or prevent imminent or life-threatening deterioration.  Critical care was time spent personally by me on the following activities: development of treatment plan with patient and/or surrogate as well as nursing, discussions with consultants, evaluation of patient's response to treatment, examination of patient, obtaining history from patient or surrogate, ordering and performing treatments and interventions, ordering and review of laboratory studies, ordering and review of radiographic studies, pulse oximetry, re-evaluation of patient's condition and participation in multidisciplinary rounds.  RKipp Brood MD [facility] Physician CMunden Pager: [phone]: [phone] hours: [phone].

## 2023-01-24 NOTE — ED Notes (Signed)
Report called to 26M.

## 2023-01-24 NOTE — ED Notes (Signed)
No seizure pads available. Blankets applied to bed railing instead.

## 2023-01-24 NOTE — Progress Notes (Signed)
eLink Physician-Brief Progress Note Patient Name: Cole Henderson DOB: 02/22/1976 MRN: [id number]   Date of Service  01/24/2023  HPI/Events of Note  Patient admitted with severe ETOH withdrawal, required several doses of iv Ativan in the ED, he is currently somnolent.  eICU Interventions  New Patient Evaluation. Stat ABG ordered, PCCM ground crew requested to evaluate patient at bedside for possible intubation.        [name] Shaindel Sweeten 01/24/2023, 1:35 AM

## 2023-01-24 NOTE — Progress Notes (Signed)
   NAME:  Tedford Mcgowen, MRN:  [id number], DOB:  02-13-1976, LOS: 0 ADMISSION DATE:  01/23/2023, CONSULTATION DATE:  01/23/2023 REFERRING MD:  Leonette [facility] ED [facility], CHIEF COMPLAINT:  alcohol withdrawal   History of Present Illness:  47 year old man with severe alcohol withdrawal with delirium tremens. Normally drinks 1 gallon of whiskey but none today. Reported visual hallucinations.   Pertinent  Medical History   Past Medical History:  Diagnosis Date   Drug abuse ([place])    Hypertension    Schizophrenia ([place])    Tardive dyskinesia    Significant Hospital Events: Including procedures, antibiotic start and stop dates in addition to other pertinent events   2/21 admitted with delirium tremens.   Interim History / Subjective:  Lethargic; sluggish withdraws to painful stimuli  Objective   Blood pressure 96/70, pulse 64, temperature (!) 97.4 F (36.3 C), temperature source Axillary, resp. rate (!) 23, height 5' 10"$  (1.778 m), weight 99 kg, SpO2 99 %.        Intake/Output Summary (Last 24 hours) at 01/24/2023 0846 Last data filed at 01/24/2023 0700 Gross per 24 hour  Intake 467.67 ml  Output --  Net 467.67 ml   Filed Weights   01/23/23 1744 01/24/23 0500  Weight: 102.1 kg 99 kg    Examination: General:   ill appearing male in NAD HEENT: MM pink/moist; [place] in place Neuro: lethargic on precedex (recently received phenobarb and ativan); was agitated overnight; PERRL CV: s1s2, RRR, no m/r/g PULM:  dim clear BS bilaterally; [place] 4L GI: soft, bsx4 active  Extremities: warm/dry, no edema  Skin: no rashes or lesions   Ancillary tests recently reviewed  Unremarkable Ethoh <10 Assessment & Plan:  Alcohol withdrawal with delirium tremens Chronic alcohol abuse: drinks about 1 gallon of whiskey dailys Plan: -phenobarb taper -wean precedex as able -prn ativan -monitor for seizures -thiamine, folic acid  Schizophrenia with prior auditory hallucinations.  Plan: -unknown if patient is  taking home meds regularly. Only sertraline and Artane refilled recently -check qtc -consider resuming home meds when able to take po  Hyperglycemia -cbg 215 this am Plan: -cbg monitoring -check A1c  Hypokalemia Plan: -K repleted  Chronic Anemia Plan: -trend cbc  Tobacco abuse Plan: -nicotine patch  Best Practice (right click and "Reselect all SmartList Selections" daily)   Diet/type: NPO w/ oral meds DVT prophylaxis: LMWH GI prophylaxis: N/A Lines: N/A Foley:  N/A Code Status:  full code Last date of multidisciplinary goals of care discussion [2/21 attempted to call friend Elta Guadeloupe, Mother Jackelyn Poling, and daughter Glenard Haring but no answer over phone]  CRITICAL CARE Performed by: Mick Sell   Total critical care time: 35 minutes   JD Rexene Agent [facility] 01/24/2023, 9:13 AM  Please see [email or web address] for pager details.  From 7A-7P if no response, please call [phone]. After hours, please call ELink [phone].

## 2023-01-24 NOTE — Progress Notes (Signed)
This RN received patient from ED. Patient on precedex drip, 4L [place], arrived via bed along with ED RN and ED tech. Patient belongings include clothing (pants, belt, undergarment), shoes, backpack with (cigarettes, prescription Trihexyphenidyl). Will send down cigarettes and prescription to get locked away. Patient is successfully transferred to 3m5.

## 2023-01-24 NOTE — Progress Notes (Signed)
eLink Physician-Brief Progress Note Patient Name: Cole Henderson DOB: 1976-09-04 MRN: [id number]   Date of Service  01/24/2023  HPI/Events of Note  K+ 3.4.  eICU Interventions  K+ replaced per electrolyte replacement protocol.        [name] [name] 01/24/2023, 6:02 AM

## 2023-01-25 DIAGNOSIS — R34 Anuria and oliguria: Secondary | ICD-10-CM

## 2023-01-25 DIAGNOSIS — E876 Hypokalemia: Secondary | ICD-10-CM

## 2023-01-25 DIAGNOSIS — E162 Hypoglycemia, unspecified: Secondary | ICD-10-CM

## 2023-01-25 LAB — BASIC METABOLIC PANEL
Anion gap: 9 (ref 5–15)
BUN: 13 mg/dL (ref 6–20)
CO2: 24 mmol/L (ref 22–32)
Calcium: 8.3 mg/dL — ABNORMAL LOW (ref 8.9–10.3)
Chloride: 105 mmol/L (ref 98–111)
Creatinine, Ser: 0.77 mg/dL (ref 0.61–1.24)
GFR, Estimated: >60 mL/min (ref >60)
Glucose, Bld: 86 mg/dL (ref 70–99)
Potassium: 3.4 mmol/L — ABNORMAL LOW (ref 3.5–5.1)
Sodium: 138 mmol/L (ref 135–145)

## 2023-01-25 LAB — CBC
HCT: 32.8 % — ABNORMAL LOW (ref 39.0–52.0)
Hemoglobin: 10.4 g/dL — ABNORMAL LOW (ref 13.0–17.0)
MCH: 27.1 pg (ref 26.0–34.0)
MCHC: 31.7 g/dL (ref 30.0–36.0)
MCV: 85.4 fL (ref 80.0–100.0)
Platelets: 206 10*3/uL (ref 150–400)
RBC: 3.84 MIL/uL — ABNORMAL LOW (ref 4.22–5.81)
RDW: 15.3 % (ref 11.5–15.5)
WBC: 7.2 10*3/uL (ref 4.0–10.5)
nRBC: 0 % (ref 0.0–0.2)

## 2023-01-25 LAB — GLUCOSE, CAPILLARY
Glucose-Capillary: 110 mg/dL — ABNORMAL HIGH (ref 70–99)
Glucose-Capillary: 118 mg/dL — ABNORMAL HIGH (ref 70–99)
Glucose-Capillary: 127 mg/dL — ABNORMAL HIGH (ref 70–99)
Glucose-Capillary: 172 mg/dL — ABNORMAL HIGH (ref 70–99)
Glucose-Capillary: 175 mg/dL — ABNORMAL HIGH (ref 70–99)
Glucose-Capillary: 89 mg/dL (ref 70–99)

## 2023-01-25 LAB — MAGNESIUM: Magnesium: 1.9 mg/dL (ref 1.7–2.4)

## 2023-01-25 LAB — AMMONIA: Ammonia: 35 umol/L (ref 9–35)

## 2023-01-25 MED ORDER — ADULT MULTIVITAMIN W/MINERALS CH
1.0000 | ORAL_TABLET | Freq: Every day | ORAL | Status: DC
  Administered 2023-01-25 – 2023-01-30 (×4): 1 via ORAL
  Filled 2023-01-25 (×5): qty 1

## 2023-01-25 MED ORDER — PHENOBARBITAL 32.4 MG PO TABS: 32.4000 mg | ORAL_TABLET | Freq: Three times a day (TID) | ORAL | Status: DC

## 2023-01-25 MED ORDER — ACETAMINOPHEN 325 MG PO TABS
650.0000 mg | ORAL_TABLET | Freq: Four times a day (QID) | ORAL | Status: DC | PRN
  Administered 2023-01-25 – 2023-01-31 (×3): 650 mg via ORAL
  Filled 2023-01-25 (×3): qty 2

## 2023-01-25 MED ORDER — POTASSIUM CHLORIDE 10 MEQ/100ML IV SOLN
10.0000 meq | INTRAVENOUS | Status: AC
  Administered 2023-01-25 (×4): 10 meq via INTRAVENOUS
  Filled 2023-01-25 (×3): qty 100

## 2023-01-25 MED ORDER — DEXTROSE IN LACTATED RINGERS 5 % IV SOLN: INTRAVENOUS | Status: DC

## 2023-01-25 MED ORDER — LACTATED RINGERS IV BOLUS
1000.0000 mL | Freq: Once | INTRAVENOUS | Status: AC
  Administered 2023-01-25: 1000 mL via INTRAVENOUS

## 2023-01-25 MED ORDER — SERTRALINE HCL 100 MG PO TABS
100.0000 mg | ORAL_TABLET | Freq: Every day | ORAL | Status: DC
  Administered 2023-01-25 – 2023-01-30 (×4): 100 mg via ORAL
  Filled 2023-01-25 (×5): qty 1

## 2023-01-25 MED ORDER — TRIHEXYPHENIDYL HCL 5 MG PO TABS
5.0000 mg | ORAL_TABLET | Freq: Three times a day (TID) | ORAL | Status: DC
  Administered 2023-01-25 – 2023-01-30 (×9): 5 mg via ORAL
  Filled 2023-01-25 (×15): qty 1

## 2023-01-25 MED ORDER — PANTOPRAZOLE SODIUM 40 MG PO TBEC
40.0000 mg | DELAYED_RELEASE_TABLET | Freq: Every day | ORAL | Status: DC
  Administered 2023-01-25 – 2023-01-27 (×3): 40 mg via ORAL
  Filled 2023-01-25 (×3): qty 1

## 2023-01-25 MED ORDER — PHENOBARBITAL 32.4 MG PO TABS
64.8000 mg | ORAL_TABLET | Freq: Three times a day (TID) | ORAL | Status: DC
  Administered 2023-01-26 – 2023-01-27 (×4): 64.8 mg via ORAL
  Filled 2023-01-25 (×4): qty 2

## 2023-01-25 MED ORDER — LORAZEPAM 2 MG/ML IJ SOLN
1.0000 mg | INTRAMUSCULAR | Status: AC | PRN
  Administered 2023-01-25: 2 mg via INTRAVENOUS
  Administered 2023-01-26 (×2): 4 mg via INTRAVENOUS
  Administered 2023-01-26: 2 mg via INTRAVENOUS
  Administered 2023-01-26: 4 mg via INTRAVENOUS
  Administered 2023-01-26: 2 mg via INTRAVENOUS
  Administered 2023-01-26: 4 mg via INTRAVENOUS
  Administered 2023-01-26: 2 mg via INTRAVENOUS
  Administered 2023-01-27 (×2): 4 mg via INTRAVENOUS
  Administered 2023-01-27: 2 mg via INTRAVENOUS
  Administered 2023-01-27: 4 mg via INTRAVENOUS
  Administered 2023-01-27: 2 mg via INTRAVENOUS
  Administered 2023-01-27 – 2023-01-28 (×2): 4 mg via INTRAVENOUS
  Filled 2023-01-25 (×2): qty 2
  Filled 2023-01-25 (×3): qty 1
  Filled 2023-01-25 (×2): qty 2
  Filled 2023-01-25: qty 1
  Filled 2023-01-25 (×3): qty 2
  Filled 2023-01-25: qty 1
  Filled 2023-01-25: qty 2
  Filled 2023-01-25: qty 1
  Filled 2023-01-25: qty 2
  Filled 2023-01-25: qty 1

## 2023-01-25 MED ORDER — LORAZEPAM 1 MG PO TABS
1.0000 mg | ORAL_TABLET | ORAL | Status: AC | PRN
  Filled 2023-01-25: qty 4

## 2023-01-25 MED ORDER — FOLIC ACID 1 MG PO TABS
1.0000 mg | ORAL_TABLET | Freq: Every day | ORAL | Status: DC
  Administered 2023-01-26: 1 mg via ORAL
  Filled 2023-01-25 (×2): qty 1

## 2023-01-25 MED ORDER — MAGNESIUM SULFATE 2 GM/50ML IV SOLN
2.0000 g | Freq: Once | INTRAVENOUS | Status: AC
  Administered 2023-01-25: 2 g via INTRAVENOUS
  Filled 2023-01-25: qty 50

## 2023-01-25 MED ORDER — HYDROXYZINE HCL 25 MG PO TABS
25.0000 mg | ORAL_TABLET | Freq: Four times a day (QID) | ORAL | Status: DC | PRN
  Administered 2023-01-25 – 2023-01-31 (×7): 25 mg via ORAL
  Filled 2023-01-25 (×7): qty 1

## 2023-01-25 MED ORDER — LORAZEPAM 2 MG/ML IJ SOLN: 1.0000 mg | INTRAMUSCULAR | Status: DC | PRN

## 2023-01-25 MED ORDER — PHENOBARBITAL 32.4 MG PO TABS
97.2000 mg | ORAL_TABLET | Freq: Three times a day (TID) | ORAL | Status: AC
  Administered 2023-01-25 (×2): 97.2 mg via ORAL
  Filled 2023-01-25 (×2): qty 3

## 2023-01-25 MED ORDER — FLUPHENAZINE HCL 5 MG PO TABS
10.0000 mg | ORAL_TABLET | Freq: Two times a day (BID) | ORAL | Status: DC
  Administered 2023-01-25 – 2023-01-29 (×5): 10 mg via ORAL
  Filled 2023-01-25 (×9): qty 2

## 2023-01-25 NOTE — TOC CAGE-AID Note (Signed)
Transition of Care [facility]) - CAGE-AID Screening   Patient Details  Name: Cole Henderson MRN: [id number] Date of Birth: 1976/03/03  Transition of Care [facility]) CM/SW Contact:    Milinda Antis, [place] Phone Number: 01/25/2023, 3:13 PM   Clinical Narrative: LCSW received consult for substance use and met with the patient at bedside.  The patient speech was garbled and very difficult to understand, but the patient was receptive and requested that substance resources be left at bedside.    TOC following.   CAGE-AID Screening:

## 2023-01-25 NOTE — Evaluation (Signed)
Clinical/Bedside Swallow Evaluation Patient Details  Name: Cole Henderson MRN: [id number] Date of Birth: 04-13-1976  Today's Date: 01/25/2023 Time: SLP Start Time (ACUTE ONLY): 0930 SLP Stop Time (ACUTE ONLY): 0955 SLP Time Calculation (min) (ACUTE ONLY): 25 min  Past Medical History:  Past Medical History:  Diagnosis Date   Drug abuse ([place])    Hypertension    Schizophrenia ([place])    Tardive dyskinesia    Past Surgical History: History reviewed. No pertinent surgical history. HPI:  Pt is a 47 yo male presenting with severe alcohol withdrawal and visual hallucinations. PMH includes chronic alcohol abuse, tobacco abuse, schizophrenia, HTN, tardive dyskinesia, and chronic anemia.    Assessment / Plan / Recommendation  Clinical Impression  RN reports coughing with thin liquids and a hoarse quality of voice that is somewhat improved with pt manually pushing against his throat. RN turned off Precedex at beginning of SLP session. Oral motor exam was [facility], with a noted dry tongue coating. His voice was hoarse, low in quality, and had noted hypernasality that was not improved with increased volume. Pt reports onset of one week prior. Pt was observed with trials of thin liquids, purees, and solids. A delayed cough was present after sequential sips of thin liquids. Oral residue and pocketing was noted with purees and solids but was cleared with a liquid wash and as trials progressed, pt became more aware of residue and it was independently cleared. Suspect likely esophageal issues due to daily consumption of large amounts of alcohol, although pt denied any history of reflux. Expect pt's swallowing will improve as his mentation improves, although it is likely to fluctuate. Recommend Dys 2 diet with thin liquids and meds crushed in purees. SLP will f/u to assess pt's ability to safely swallow upgraded textures. SLP Visit Diagnosis: Dysphagia, unspecified (R13.10)    Aspiration Risk  Mild aspiration risk     Diet Recommendation Dysphagia 2 (Fine chop);Thin liquid   Liquid Administration via: Straw;Cup Medication Administration: Crushed with puree Supervision: Full supervision/cueing for compensatory strategies Compensations: Slow rate;Small sips/bites;Minimize environmental distractions;Follow solids with liquid Postural Changes: Seated upright at 90 degrees;Remain upright for at least 30 minutes after po intake    Other  Recommendations Oral Care Recommendations: Oral care BID    Recommendations for follow up therapy are one component of a multi-disciplinary discharge planning process, led by the attending physician.  Recommendations may be updated based on patient status, additional functional criteria and insurance authorization.  Follow up Recommendations No SLP follow up      Assistance Recommended at Discharge    Functional Status Assessment Patient has had a recent decline in their functional status and demonstrates the ability to make significant improvements in function in a reasonable and predictable amount of time.  Frequency and Duration min 2x/week  2 weeks       Prognosis Prognosis for improved oropharyngeal function: Good Barriers to Reach Goals: Medication      Swallow Study   General HPI: Pt is a 47 yo male presenting with severe alcohol withdrawal and visual hallucinations. PMH includes chronic alcohol abuse, tobacco abuse, schizophrenia, HTN, tardive dyskinesia, and chronic anemia. Type of Study: Bedside Swallow Evaluation Previous Swallow Assessment: none in chart Diet Prior to this Study: Clear liquid diet Temperature Spikes Noted: No Respiratory Status: Nasal cannula History of Recent Intubation: No Behavior/Cognition: Alert;Cooperative;Lethargic/Drowsy Oral Cavity Assessment: Dry (dry tongue coating) Oral Care Completed by SLP: No Oral Cavity - Dentition: Edentulous;Poor condition (dentures with pt belongings but not placed  for eval) Vision: Functional for  self-feeding Self-Feeding Abilities: Able to feed self Patient Positioning: Upright in bed Baseline Vocal Quality: Hoarse;Low vocal intensity (noted hypernasality) Volitional Cough: Strong Volitional Swallow: Able to elicit    Oral/Motor/Sensory Function Overall Oral Motor/Sensory Function: Within functional limits   Ice Chips Ice chips: Not tested   Thin Liquid Thin Liquid: Impaired Presentation: Straw;Self Fed Pharyngeal  Phase Impairments: Cough - Delayed    Nectar Thick Nectar Thick Liquid: Not tested   Honey Thick Honey Thick Liquid: Not tested   Puree Puree: Impaired Presentation: Spoon;Self Fed Oral Phase Functional Implications: Left lateral sulci pocketing;Right lateral sulci pocketing;Oral residue   Solid     Solid: Impaired Presentation: Self Fed Oral Phase Functional Implications: Oral residue      Fabio Asa., Student SLP  01/25/2023,11:22 AM

## 2023-01-25 NOTE — Progress Notes (Signed)
eLink Physician-Brief Progress Note Patient Name: Cole Henderson DOB: 1976/08/29 MRN: [id number]   Date of Service  01/25/2023  HPI/Events of Note  Patient is oliguric and has minimal oral intake due to his ETOH withdrawal delirium.  eICU Interventions  D 5 % LR  gtt ordered to run at 75 ml / hour x 48 hours.        Frederik Pear 01/25/2023, 2:39 AM

## 2023-01-25 NOTE — Progress Notes (Signed)
[facility] ADULT ICU REPLACEMENT PROTOCOL   The patient does apply for the [facility] Adult ICU Electrolyte Replacment Protocol based on the criteria listed below:   1.Exclusion criteria: TCTS, ECMO, Dialysis, and Myasthenia Gravis patients 2. Is GFR >/= 30 ml/min? Yes.    Patient's GFR today is >60 3. Is SCr </= 2? Yes.   Patient's SCr is 0.77 mg/dL 4. Did SCr increase >/= 0.5 in 24 hours? No. 5.Pt's weight >40kg  Yes.   6. Abnormal electrolyte(s):   K 3.4, Mg 1.9  7. Electrolytes replaced per protocol 8.  Call MD STAT for K+ </= 2.5, Phos </= 1, or Mag </= 1 Physician:  Lowella Bandy R Zaeda Mcferran 01/25/2023 4:24 AM

## 2023-01-25 NOTE — Progress Notes (Signed)
   NAME:  Cole Henderson, MRN:  [id number], DOB:  07/18/1976, LOS: 1 ADMISSION DATE:  01/23/2023, CONSULTATION DATE:  01/23/2023 REFERRING MD:  Leonette Monarch ED MC, CHIEF COMPLAINT:  alcohol withdrawal   History of Present Illness:  47 year old man with severe alcohol withdrawal with delirium tremens. Normally drinks 1 gallon of whiskey but none today. Reported visual hallucinations.   Pertinent  Medical History   Past Medical History:  Diagnosis Date   Drug abuse ([place])    Hypertension    Schizophrenia ([place])    Tardive dyskinesia    Significant Hospital Events: Including procedures, antibiotic start and stop dates in addition to other pertinent events   2/21 admitted with delirium tremens.   Interim History / Subjective:  Hypoglycemic overnight; patient is npo due to lethargy; started on IV dextrose Precedex restarted overnight. Currently on 0.3 UOP low, given 1L LR bolus yesterday with 400 UOP last 24 hours UDS positive for amphetamines  Objective   Blood pressure 101/74, pulse 71, temperature 97.6 F (36.4 C), temperature source Axillary, resp. rate 20, height 5' 10"$  (1.778 m), weight 101.1 kg, SpO2 98 %.        Intake/Output Summary (Last 24 hours) at 01/25/2023 0824 Last data filed at 01/25/2023 0500 Gross per 24 hour  Intake 199.5 ml  Output 400 ml  Net -200.5 ml    Filed Weights   01/23/23 1744 01/24/23 0500 01/25/23 0402  Weight: 102.1 kg 99 kg 101.1 kg    Examination: General:  ill appearing male in NAD HEENT: MM pink/dry; [place] in place Neuro: Patient awake/following commands CV: s1s2, RRR, no m/r/g PULM: dim clear BS bilaterally; [place] 3L GI: soft, bsx4 active  Extremities: warm/dry, no edema  Skin: no rashes or lesions   Ancillary tests recently reviewed  Unremarkable Ethoh <10 Assessment & Plan:  Alcohol withdrawal with delirium tremens Chronic alcohol abuse: drinks about 1 gallon of whiskey dailys -UDS positive for amphetamines (also benzo and barbiturates  which were given in hospital) Plan: -cont phenobarb taper -wean precedex  -prn ativan -monitor for seizures -thiamine, folic acid, mvi  Schizophrenia with prior auditory hallucinations.  -unknown if patient is taking home meds regularly. Only sertraline and Artane refilled recently Plan: -resume home sertraline and prn hydroxyzine for now  Hyperglycemia/Hypoglycemia -A1c 5.5 Plan: -cbg monitoring -started on dextrose overnight; will dc -advance diet as tolerated today  Oliguria -likely dehydration Plan: -will give IV fluids -push po fluids today  -monitor UOP  Hypokalemia Plan: -K repleted this am -trend bmp  Chronic Anemia Plan: -trend cbc  Tobacco abuse Plan: -nicotine patch  Best Practice (right click and "Reselect all SmartList Selections" daily)   Diet/type: clear liquids DVT prophylaxis: LMWH GI prophylaxis: N/A Lines: N/A Foley:  N/A Code Status:  full code Last date of multidisciplinary goals of care discussion [2/22 attempted to call family over phone; no answer]  CRITICAL CARE Performed by: Mick Sell   Total critical care time: 35 minutes   JD Rexene Agent [place] [facility] 01/25/2023, 8:24 AM  Please see [email or web address] for pager details.  From 7A-7P if no response, please call [phone]. After hours, please call ELink [phone].

## 2023-01-26 LAB — CBC
HCT: 31.8 % — ABNORMAL LOW (ref 39.0–52.0)
Hemoglobin: 10.3 g/dL — ABNORMAL LOW (ref 13.0–17.0)
MCH: 27.3 pg (ref 26.0–34.0)
MCHC: 32.4 g/dL (ref 30.0–36.0)
MCV: 84.4 fL (ref 80.0–100.0)
Platelets: 204 10*3/uL (ref 150–400)
RBC: 3.77 MIL/uL — ABNORMAL LOW (ref 4.22–5.81)
RDW: 15.1 % (ref 11.5–15.5)
WBC: 6.7 10*3/uL (ref 4.0–10.5)
nRBC: 0.3 % — ABNORMAL HIGH (ref 0.0–0.2)

## 2023-01-26 LAB — BASIC METABOLIC PANEL
Anion gap: 9 (ref 5–15)
BUN: 10 mg/dL (ref 6–20)
CO2: 26 mmol/L (ref 22–32)
Calcium: 8.4 mg/dL — ABNORMAL LOW (ref 8.9–10.3)
Chloride: 101 mmol/L (ref 98–111)
Creatinine, Ser: 0.74 mg/dL (ref 0.61–1.24)
GFR, Estimated: >60 mL/min (ref >60)
Glucose, Bld: 112 mg/dL — ABNORMAL HIGH (ref 70–99)
Potassium: 3.8 mmol/L (ref 3.5–5.1)
Sodium: 136 mmol/L (ref 135–145)

## 2023-01-26 LAB — GLUCOSE, CAPILLARY
Glucose-Capillary: 96 mg/dL (ref 70–99)
Glucose-Capillary: 145 mg/dL — ABNORMAL HIGH (ref 70–99)
Glucose-Capillary: 141 mg/dL — ABNORMAL HIGH (ref 70–99)
Glucose-Capillary: 131 mg/dL — ABNORMAL HIGH (ref 70–99)
Glucose-Capillary: 140 mg/dL — ABNORMAL HIGH (ref 70–99)
Glucose-Capillary: 114 mg/dL — ABNORMAL HIGH (ref 70–99)

## 2023-01-26 LAB — FOLATE: Folate: 8.3 ng/mL (ref >5.9)

## 2023-01-26 LAB — VITAMIN B12: Vitamin B-12: 254 pg/mL (ref 180–914)

## 2023-01-26 LAB — MAGNESIUM: Magnesium: 2.1 mg/dL (ref 1.7–2.4)

## 2023-01-26 MED ORDER — CLONIDINE HCL 0.1 MG PO TABS
0.1000 mg | ORAL_TABLET | Freq: Two times a day (BID) | ORAL | Status: DC
  Administered 2023-01-26 – 2023-01-30 (×4): 0.1 mg via ORAL
  Filled 2023-01-26 (×5): qty 1

## 2023-01-26 MED ORDER — HYDRALAZINE HCL 20 MG/ML IJ SOLN
5.0000 mg | Freq: Four times a day (QID) | INTRAMUSCULAR | Status: DC | PRN
  Administered 2023-01-26: 5 mg via INTRAVENOUS
  Filled 2023-01-26 (×2): qty 1

## 2023-01-26 NOTE — Progress Notes (Signed)
PROGRESS NOTE    Cole Henderson  [id number] DOB: January 10, 1976 DOA: 01/23/2023 PCP: Patient, No Pcp Per   Brief Narrative:  47 year old male with history of hypertension, schizophrenia, tardive dyskinesia, drug abuse, ankle abuse presented with severe alcohol withdrawal with delirium tremens and visual hallucinations; apparently drinks 1 gallon of whiskey every day at home.  He was admitted to ICU and started on Precedex drip.  UDS positive for amphetamines.  He was also subsequently started on oral phenobarbital.  Since then, his Precedex has been stopped.  He was transferred to [facility] service from 01/26/2023 onwards.  Assessment & Plan:   Severe alcohol withdrawal with delirium tremens Acute toxic encephalopathy Alcohol abuse -Patient apparently drinks about 1 gallon of whiskey daily.  UDS positive for amphetamines (also benzodiazepines and barbiturates which were given in the hospital) -Initially required Precedex drip in ICU.  Precedex drip has subsequently been weaned off.  Transferred to [facility] service from 01/26/2023 onwards -Currently on phenobarb taper along with as needed Ativan.  Monitor for seizures.  Continue thiamine, multivitamin, folic acid.  TOC consulted as well  Schizophrenia with prior auditory hallucinations -Currently on fluphenazine, sertraline, trihexyphenidyl.  Outpatient follow-up with psychiatry  Hypokalemia -Resolved  Dehydration/oliguria -Treated with IV fluids.  Improving.  Encourage oral intake  Normocytic anemia -Questionable cause.  Possibly from alcohol abuse.  Hemoglobin stable.  Tobacco abuse -Nicotine patch  DVT prophylaxis: Lovenox Code Status: Full Family Communication: None at bedside Disposition Plan: Status is: Inpatient Remains inpatient appropriate because: Of severity of illness  Consultants: PCCM  Procedures: None  Antimicrobials: None   Subjective: Patient seen and examined at bedside.  Wakes up slightly, slow to respond, poor  historian.  No seizures, vomiting or agitation or fever reported by nursing staff.  Objective: Vitals:   01/26/23 1000 01/26/23 1100 01/26/23 1151 01/26/23 1200  BP: (!) 142/102 (!) 149/87  (!) 173/106  Pulse: 95 98  89  Resp: 20 (!) 21  (!) 27  Temp:   97.6 F (36.4 C)   TempSrc:   Oral   SpO2: 93% 94%  95%  Weight:      Height:        Intake/Output Summary (Last 24 hours) at 01/26/2023 1258 Last data filed at 01/26/2023 1200 Gross per 24 hour  Intake 803.33 ml  Output 4350 ml  Net -3546.67 ml   Filed Weights   01/24/23 0500 01/25/23 0402 01/26/23 0500  Weight: 99 kg 101.1 kg 101.5 kg    Examination:  General exam: Appears calm and comfortable.  Chronically ill and deconditioned.  On room air. Respiratory system: Bilateral decreased breath sounds at bases with some scattered crackles and intermittent tachypnea  cardiovascular system: S1 & S2 heard, Rate controlled Gastrointestinal system: Abdomen is nondistended, soft and nontender. Normal bowel sounds heard. Extremities: No cyanosis, clubbing, edema  Central nervous system: Wakes up slightly, slow to respond, poor historian.  No focal neurological deficits. Moving extremities Skin: No rashes, lesions or ulcers Psychiatry: Flat affect.  Not agitated currently.   Data Reviewed: I have personally reviewed following labs and imaging studies  CBC: Recent Labs  Lab 01/23/23 1859 01/24/23 0049 01/24/23 0145 01/25/23 0145 01/26/23 0137  WBC 9.8 7.7  --  7.2 6.7  NEUTROABS 6.2  --   --   --   --   HGB 11.6* 11.0* 10.9* 10.4* 10.3*  HCT 36.1* 34.1* 32.0* 32.8* 31.8*  MCV 84.0 84.0  --  85.4 84.4  PLT 260 230  --  206  0000000   Basic Metabolic Panel: Recent Labs  Lab 01/23/23 1859 01/24/23 0049 01/24/23 0145 01/25/23 0145 01/26/23 0137  NA 136 137 138 138 136  K 3.6 3.4* 3.6 3.4* 3.8  CL 103 103  --  105 101  CO2 22 24  --  24 26  GLUCOSE 151* 110*  --  86 112*  BUN 23* 19  --  13 10  CREATININE 1.16 0.92  --   0.77 0.74  CALCIUM 9.0 8.5*  --  8.3* 8.4*  MG  --  2.0  --  1.9 2.1  PHOS  --  4.1  --   --   --    GFR: Estimated Creatinine Clearance: 136.3 mL/min (by C-G formula based on SCr of 0.74 mg/dL). Liver Function Tests: Recent Labs  Lab 01/23/23 1859 01/24/23 0049  AST 38 39  ALT 29 27  ALKPHOS 58 56  BILITOT 0.9 1.3*  PROT 6.3* 5.9*  ALBUMIN 3.7 3.4*   No results for input(s): "LIPASE", "AMYLASE" in the last 168 hours. Recent Labs  Lab 01/25/23 0145  AMMONIA 35   Coagulation Profile: No results for input(s): "INR", "PROTIME" in the last 168 hours. Cardiac Enzymes: No results for input(s): "CKTOTAL", "CKMB", "CKMBINDEX", "TROPONINI" in the last 168 hours. BNP (last 3 results) No results for input(s): "PROBNP" in the last 8760 hours. HbA1C: Recent Labs    01/24/23 0019  HGBA1C 5.5   CBG: Recent Labs  Lab 01/25/23 1949 01/25/23 2334 01/26/23 0349 01/26/23 0747 01/26/23 1149  GLUCAP 118* 89 96 145* 141*   Lipid Profile: No results for input(s): "CHOL", "HDL", "LDLCALC", "TRIG", "CHOLHDL", "LDLDIRECT" in the last 72 hours. Thyroid Function Tests: No results for input(s): "TSH", "T4TOTAL", "FREET4", "T3FREE", "THYROIDAB" in the last 72 hours. Anemia Panel: Recent Labs    01/26/23 0137  VITAMINB12 254  FOLATE 8.3   Sepsis Labs: No results for input(s): "PROCALCITON", "LATICACIDVEN" in the last 168 hours.  Recent Results (from the past 240 hour(s))  Resp panel by RT-PCR (RSV, Flu A&B, Covid) Anterior Nasal Swab     Status: None   Collection Time: 01/23/23  9:01 PM   Specimen: Anterior Nasal Swab  Result Value Ref Range Status   SARS Coronavirus 2 by RT PCR NEGATIVE NEGATIVE Final   Influenza A by PCR NEGATIVE NEGATIVE Final   Influenza B by PCR NEGATIVE NEGATIVE Final    Comment: (NOTE) The Xpert Xpress SARS-CoV-2/FLU/RSV plus assay is intended as an aid in the diagnosis of influenza from Nasopharyngeal swab specimens and should not be used as a sole  basis for treatment. Nasal washings and aspirates are unacceptable for Xpert Xpress SARS-CoV-2/FLU/RSV testing.  Fact Sheet for Patients: EntrepreneurPulse.com.au  Fact Sheet for Healthcare Providers: IncredibleEmployment.be  This test is not yet approved or cleared by the [place] FDA and has been authorized for detection and/or diagnosis of SARS-CoV-2 by FDA under an Emergency Use Authorization (EUA). This EUA will remain in effect (meaning this test can be used) for the duration of the COVID-19 declaration under Section 564(b)(1) of the Act, 21 U.S.C. section 360bbb-3(b)(1), unless the authorization is terminated or revoked.     Resp Syncytial Virus by PCR NEGATIVE NEGATIVE Final    Comment: (NOTE) Fact Sheet for Patients: EntrepreneurPulse.com.au  Fact Sheet for Healthcare Providers: IncredibleEmployment.be  This test is not yet approved or cleared by the [place] FDA and has been authorized for detection and/or diagnosis of SARS-CoV-2 by FDA under an Emergency Use Authorization (EUA). This  EUA will remain in effect (meaning this test can be used) for the duration of the COVID-19 declaration under Section 564(b)(1) of the Act, 21 U.S.C. section 360bbb-3(b)(1), unless the authorization is terminated or revoked.  Performed at [facility], [place] [address] [place], [place] [address]   MRSA Next Gen by PCR, Nasal     Status: None   Collection Time: 01/24/23  1:45 AM   Specimen: Nasal Mucosa; Nasal Swab  Result Value Ref Range Status   MRSA by PCR Next Gen NOT DETECTED NOT DETECTED Final    Comment: (NOTE) The GeneXpert MRSA Assay (FDA approved for NASAL specimens only), is one component of a comprehensive MRSA colonization surveillance program. It is not intended to diagnose MRSA infection nor to guide or monitor treatment for MRSA infections. Test performance is not FDA approved in  patients less than 68 years old. Performed at [facility], [place] [address], [place], [place] [address]          Radiology Studies: No results found.      Scheduled Meds:  Chlorhexidine Gluconate Cloth  6 each Topical Q0600   enoxaparin (LOVENOX) injection  40 mg Subcutaneous Q24H   fluPHENAZine  10 mg Oral BID   folic acid  1 mg Oral Daily   multivitamin with minerals  1 tablet Oral Daily   nicotine  21 mg Transdermal Daily   pantoprazole  40 mg Oral Q1200   phenobarbital  64.8 mg Oral Q8H   Followed by   Derrill Memo ON 01/28/2023] phenobarbital  32.4 mg Oral Q8H   sertraline  100 mg Oral Daily   thiamine  100 mg Oral Daily   trihexyphenidyl  5 mg Oral TID   Continuous Infusions:        Aline August, MD [facility] 01/26/2023, 12:58 PM

## 2023-01-26 NOTE — Progress Notes (Signed)
Aileen Fass, MD, [facility], made aware of patients SBP in 180's, consistently. Order placed for hydralazine '5mg'$  IV PRN Q6 hrs. For SBP>180 & DBP>105. Continuing to monitor patient closely.

## 2023-01-26 NOTE — Progress Notes (Signed)
Speech Language Pathology Treatment: Dysphagia  Patient Details Name: Cole Henderson MRN: [id number] DOB: 08-04-1976 Today's Date: 01/26/2023 Time: [id number] SLP Time Calculation (min) (ACUTE ONLY): 16 min  Assessment / Plan / Recommendation Clinical Impression  Pt's upper dentures were donned per his request as he consumed advanced trials of solids along with thin liquids. He is impulsive, taking large pieces of cracker at once into his mouth, and not waiting until his mouth is fully cleared before he takes more. Oral residue still present in at least mild amounts but pt does not necessarily clear it on his own. It does clear fairly well with use of liquid wash. Throat clearing and coughing were noted only in the presence of more solid textures. Recommend staying with Dys 2 diet and thin liquids.   Although SLP not ordered for speech, note that pt continues to present with changes to his speech that include consonant imprecision, reduced volume, impaired resonance, and self-reports of impaired sensation along his face. Unclear timing of onset but he seems to think that this is a change from his baseline. Question if MD may wish to assess further for possible etiology.    HPI HPI: Pt is a 47 yo male presenting with severe alcohol withdrawal and visual hallucinations. PMH includes chronic alcohol abuse, tobacco abuse, schizophrenia, HTN, tardive dyskinesia, and chronic anemia.      SLP Plan  Continue with current plan of care      Recommendations for follow up therapy are one component of a multi-disciplinary discharge planning process, led by the attending physician.  Recommendations may be updated based on patient status, additional functional criteria and insurance authorization.    Recommendations  Diet recommendations: Dysphagia 2 (fine chop);Thin liquid Liquids provided via: Cup;Straw Medication Administration: Crushed with puree Supervision: Staff to assist with self  feeding Compensations: Slow rate;Small sips/bites;Minimize environmental distractions;Follow solids with liquid Postural Changes and/or Swallow Maneuvers: Seated upright 90 degrees;Upright 30-60 min after meal                Oral Care Recommendations: Oral care BID Follow Up Recommendations: No SLP follow up Assistance recommended at discharge: Frequent or constant Supervision/Assistance SLP Visit Diagnosis: Dysphagia, unspecified (R13.10) Plan: Continue with current plan of care           Osie Bond., M.A. [place] Office [phone]  Secure chat preferred   01/26/2023, 12:50 PM

## 2023-01-27 LAB — GLUCOSE, CAPILLARY
Glucose-Capillary: 119 mg/dL — ABNORMAL HIGH (ref 70–99)
Glucose-Capillary: 121 mg/dL — ABNORMAL HIGH (ref 70–99)
Glucose-Capillary: 98 mg/dL (ref 70–99)
Glucose-Capillary: 96 mg/dL (ref 70–99)
Glucose-Capillary: 104 mg/dL — ABNORMAL HIGH (ref 70–99)

## 2023-01-27 LAB — BASIC METABOLIC PANEL
Anion gap: 10 (ref 5–15)
BUN: 6 mg/dL (ref 6–20)
CO2: 24 mmol/L (ref 22–32)
Calcium: 8.9 mg/dL (ref 8.9–10.3)
Chloride: 101 mmol/L (ref 98–111)
Creatinine, Ser: 0.76 mg/dL (ref 0.61–1.24)
GFR, Estimated: >60 mL/min (ref >60)
Glucose, Bld: 114 mg/dL — ABNORMAL HIGH (ref 70–99)
Potassium: 3.7 mmol/L (ref 3.5–5.1)
Sodium: 135 mmol/L (ref 135–145)

## 2023-01-27 LAB — MAGNESIUM: Magnesium: 2.1 mg/dL (ref 1.7–2.4)

## 2023-01-27 MED ORDER — ORAL CARE MOUTH RINSE: 15.0000 mL | OROMUCOSAL | Status: DC

## 2023-01-27 MED ORDER — ORAL CARE MOUTH RINSE: 15.0000 mL | OROMUCOSAL | Status: DC | PRN

## 2023-01-27 MED ORDER — PHENOBARBITAL SODIUM 65 MG/ML IJ SOLN
65.0000 mg | Freq: Three times a day (TID) | INTRAMUSCULAR | Status: AC
  Administered 2023-01-27 (×2): 65 mg via INTRAVENOUS
  Filled 2023-01-27 (×2): qty 1

## 2023-01-27 MED ORDER — ORAL CARE MOUTH RINSE
15.0000 mL | OROMUCOSAL | Status: DC
  Administered 2023-01-27 – 2023-02-01 (×15): 15 mL via OROMUCOSAL

## 2023-01-27 MED ORDER — PHENOBARBITAL SODIUM 65 MG/ML IJ SOLN
32.5000 mg | Freq: Three times a day (TID) | INTRAMUSCULAR | Status: DC
  Administered 2023-01-28: 32.5 mg via INTRAVENOUS
  Filled 2023-01-27: qty 1

## 2023-01-27 NOTE — Progress Notes (Signed)
PT Cancellation Note  Patient Details Name: Cole Henderson MRN: [id number] DOB: 1976-05-12   Cancelled Treatment:    Reason Eval/Treat Not Completed: Fatigue/lethargy limiting ability to participate Pt obtunded, difficult to arouse. Given ativan last night. Will follow up when pt more alert and able to participate.   Cole Henderson 01/27/2023, 11:28 AM Cole Henderson, PT, DPT Acute Rehabilitation Services Secure chat preferred Office [phone]

## 2023-01-27 NOTE — Plan of Care (Signed)
  Problem: Clinical Measurements: Goal: Ability to maintain clinical measurements within normal limits will improve Outcome: Progressing Goal: Will remain free from infection Outcome: Progressing Goal: Respiratory complications will improve Outcome: Progressing Goal: Cardiovascular complication will be avoided Outcome: Progressing   

## 2023-01-27 NOTE — Progress Notes (Signed)
PROGRESS NOTE    Cole Henderson  [id number] DOB: 1976/11/19 DOA: 01/23/2023 PCP: Patient, No Pcp Per   Brief Narrative:  47 year old male with history of hypertension, schizophrenia, tardive dyskinesia, drug abuse, ankle abuse presented with severe alcohol withdrawal with delirium tremens and visual hallucinations; apparently drinks 1 gallon of whiskey every day at home.  He was admitted to ICU and started on Precedex drip.  UDS positive for amphetamines.  He was also subsequently started on oral phenobarbital.  Since then, his Precedex has been stopped.  He was transferred to [facility] service from 01/26/2023 onwards.  Assessment & Plan:   Severe alcohol withdrawal with delirium tremens Acute toxic encephalopathy Alcohol abuse -Patient apparently drinks about 1 gallon of whiskey daily.  UDS positive for amphetamines (also benzodiazepines and barbiturates which were given in the hospital) -Initially required Precedex drip in ICU.  Precedex drip has subsequently been weaned off.  Transferred to [facility] service from 01/26/2023 onwards -Currently on phenobarb taper along with as needed Ativan.  Requiring intermittent Ativan.  Added clonidine as well on 01/26/2023 for restlessness and increased blood pressure.  Monitor for seizures.  Continue thiamine, multivitamin, folic acid.  TOC consulted as well  Hypertension -Blood pressure intermittently elevated.  Continue clonidine and as needed hydralazine  Schizophrenia with prior auditory hallucinations -Currently on fluphenazine, sertraline, trihexyphenidyl.  Outpatient follow-up with psychiatry  Hypokalemia -Resolved  Dehydration/oliguria -Treated with IV fluids.  Improving.  Encourage oral intake  Normocytic anemia -Questionable cause.  Possibly from alcohol abuse.  Hemoglobin stable.  Tobacco abuse -Nicotine patch  Physical deconditioning -PT eval  DVT prophylaxis: Lovenox Code Status: Full Family Communication: None at bedside Disposition  Plan: Status is: Inpatient Remains inpatient appropriate because: Of severity of illness  Consultants: PCCM  Procedures: None  Antimicrobials: None   Subjective: Patient seen and examined at bedside.  Poor historian.  Patient received intermittent Ativan overnight as per nursing staff.  No fever, seizures, vomiting reported. Objective: Vitals:   01/27/23 0500 01/27/23 0600 01/27/23 0639 01/27/23 0739  BP: (!) 124/107 (!) 126/93    Pulse: 96 91 (!) 101   Resp: (!) 30 (!) 33    Temp:    98.1 F (36.7 C)  TempSrc:    Axillary  SpO2: 100% 98%    Weight: 97.7 kg     Height:        Intake/Output Summary (Last 24 hours) at 01/27/2023 0755 Last data filed at 01/27/2023 0544 Gross per 24 hour  Intake 860 ml  Output 4425 ml  Net -3565 ml    Filed Weights   01/25/23 0402 01/26/23 0500 01/27/23 0500  Weight: 101.1 kg 101.5 kg 97.7 kg    Examination:  General: On room air.  No distress.  Looks chronically ill and deconditioned. ENT/neck: No thyromegaly.  JVD is not elevated  respiratory: Decreased breath sounds at bases bilaterally with some crackles; no wheezing.  Intermittently tachypneic CVS: S1-S2 heard, intermittently tachycardic  abdominal: Soft, nontender, slightly distended; no organomegaly, bowel sounds are heard Extremities: Trace lower extremity edema; no cyanosis  CNS: Still very slow to respond.  Poor historian.  No focal neurologic deficit.  Moves extremities Lymph: No obvious lymphadenopathy Skin: No obvious ecchymosis/lesions  psych: Not agitated.  Affect is extremely flat.   Musculoskeletal: No obvious joint swelling/deformity    Data Reviewed: I have personally reviewed following labs and imaging studies  CBC: Recent Labs  Lab 01/23/23 1859 01/24/23 0049 01/24/23 0145 01/25/23 0145 01/26/23 0137  WBC 9.8 7.7  --  7.2 6.7  NEUTROABS 6.2  --   --   --   --   HGB 11.6* 11.0* 10.9* 10.4* 10.3*  HCT 36.1* 34.1* 32.0* 32.8* 31.8*  MCV 84.0 84.0  --   85.4 84.4  PLT 260 230  --  206 0000000    Basic Metabolic Panel: Recent Labs  Lab 01/23/23 1859 01/24/23 0049 01/24/23 0145 01/25/23 0145 01/26/23 0137 01/27/23 0025  NA 136 137 138 138 136 135  K 3.6 3.4* 3.6 3.4* 3.8 3.7  CL 103 103  --  105 101 101  CO2 22 24  --  '24 26 24  '$ GLUCOSE 151* 110*  --  86 112* 114*  BUN 23* 19  --  '13 10 6  '$ CREATININE 1.16 0.92  --  0.77 0.74 0.76  CALCIUM 9.0 8.5*  --  8.3* 8.4* 8.9  MG  --  2.0  --  1.9 2.1 2.1  PHOS  --  4.1  --   --   --   --     GFR: Estimated Creatinine Clearance: 133.8 mL/min (by C-G formula based on SCr of 0.76 mg/dL). Liver Function Tests: Recent Labs  Lab 01/23/23 1859 01/24/23 0049  AST 38 39  ALT 29 27  ALKPHOS 58 56  BILITOT 0.9 1.3*  PROT 6.3* 5.9*  ALBUMIN 3.7 3.4*    No results for input(s): "LIPASE", "AMYLASE" in the last 168 hours. Recent Labs  Lab 01/25/23 0145  AMMONIA 35    Coagulation Profile: No results for input(s): "INR", "PROTIME" in the last 168 hours. Cardiac Enzymes: No results for input(s): "CKTOTAL", "CKMB", "CKMBINDEX", "TROPONINI" in the last 168 hours. BNP (last 3 results) No results for input(s): "PROBNP" in the last 8760 hours. HbA1C: No results for input(s): "HGBA1C" in the last 72 hours.  CBG: Recent Labs  Lab 01/26/23 1551 01/26/23 1936 01/26/23 2320 01/27/23 0316 01/27/23 0738  GLUCAP 131* 140* 114* 119* 121*    Lipid Profile: No results for input(s): "CHOL", "HDL", "LDLCALC", "TRIG", "CHOLHDL", "LDLDIRECT" in the last 72 hours. Thyroid Function Tests: No results for input(s): "TSH", "T4TOTAL", "FREET4", "T3FREE", "THYROIDAB" in the last 72 hours. Anemia Panel: Recent Labs    01/26/23 0137  VITAMINB12 254  FOLATE 8.3    Sepsis Labs: No results for input(s): "PROCALCITON", "LATICACIDVEN" in the last 168 hours.  Recent Results (from the past 240 hour(s))  Resp panel by RT-PCR (RSV, Flu A&B, Covid) Anterior Nasal Swab     Status: None   Collection Time:  01/23/23  9:01 PM   Specimen: Anterior Nasal Swab  Result Value Ref Range Status   SARS Coronavirus 2 by RT PCR NEGATIVE NEGATIVE Final   Influenza A by PCR NEGATIVE NEGATIVE Final   Influenza B by PCR NEGATIVE NEGATIVE Final    Comment: (NOTE) The Xpert Xpress SARS-CoV-2/FLU/RSV plus assay is intended as an aid in the diagnosis of influenza from Nasopharyngeal swab specimens and should not be used as a sole basis for treatment. Nasal washings and aspirates are unacceptable for Xpert Xpress SARS-CoV-2/FLU/RSV testing.  Fact Sheet for Patients: EntrepreneurPulse.com.au  Fact Sheet for Healthcare Providers: IncredibleEmployment.be  This test is not yet approved or cleared by the [place] FDA and has been authorized for detection and/or diagnosis of SARS-CoV-2 by FDA under an Emergency Use Authorization (EUA). This EUA will remain in effect (meaning this test can be used) for the duration of the COVID-19 declaration under Section 564(b)(1) of the Act, 21 U.S.C. section 360bbb-3(b)(1), unless the  authorization is terminated or revoked.     Resp Syncytial Virus by PCR NEGATIVE NEGATIVE Final    Comment: (NOTE) Fact Sheet for Patients: EntrepreneurPulse.com.au  Fact Sheet for Healthcare Providers: IncredibleEmployment.be  This test is not yet approved or cleared by the [place] FDA and has been authorized for detection and/or diagnosis of SARS-CoV-2 by FDA under an Emergency Use Authorization (EUA). This EUA will remain in effect (meaning this test can be used) for the duration of the COVID-19 declaration under Section 564(b)(1) of the Act, 21 U.S.C. section 360bbb-3(b)(1), unless the authorization is terminated or revoked.  Performed at [facility], [place] [address], [place], [place] [address]   MRSA Next Gen by PCR, Nasal     Status: None   Collection Time: 01/24/23  1:45 AM   Specimen:  Nasal Mucosa; Nasal Swab  Result Value Ref Range Status   MRSA by PCR Next Gen NOT DETECTED NOT DETECTED Final    Comment: (NOTE) The GeneXpert MRSA Assay (FDA approved for NASAL specimens only), is one component of a comprehensive MRSA colonization surveillance program. It is not intended to diagnose MRSA infection nor to guide or monitor treatment for MRSA infections. Test performance is not FDA approved in patients less than 65 years old. Performed at [facility], [place] [address] [place], [place] [address]          Radiology Studies: No results found.      Scheduled Meds:  Chlorhexidine Gluconate Cloth  6 each Topical Q0600   cloNIDine  0.1 mg Oral BID   enoxaparin (LOVENOX) injection  40 mg Subcutaneous Q24H   fluPHENAZine  10 mg Oral BID   folic acid  1 mg Oral Daily   multivitamin with minerals  1 tablet Oral Daily   nicotine  21 mg Transdermal Daily   mouth rinse  15 mL Mouth Rinse 4 times per day   pantoprazole  40 mg Oral Q1200   phenobarbital  64.8 mg Oral Q8H   Followed by   Derrill Memo ON 01/28/2023] phenobarbital  32.4 mg Oral Q8H   sertraline  100 mg Oral Daily   thiamine  100 mg Oral Daily   trihexyphenidyl  5 mg Oral TID   Continuous Infusions:        Aline August, MD [facility] 01/27/2023, 7:55 AM

## 2023-01-27 NOTE — Progress Notes (Signed)
SLP Cancellation Note  Patient Details Name: Cole Henderson MRN: [id number] DOB: Oct 29, 1976   Cancelled treatment:       Reason Eval/Treat Not Completed: Patient's level of consciousness. SLP spoke with patient's RN who reports he has been more altered and less alert today. Both in agreement for SLP to wait until next date to attempt swallow evaluation.   Sonia Baller, MA, CCC-SLP Speech Therapy

## 2023-01-28 DIAGNOSIS — F10931 Alcohol use, unspecified with withdrawal delirium: Principal | ICD-10-CM

## 2023-01-28 DIAGNOSIS — I10 Essential (primary) hypertension: Secondary | ICD-10-CM

## 2023-01-28 LAB — BASIC METABOLIC PANEL
Anion gap: 13 (ref 5–15)
BUN: 11 mg/dL (ref 6–20)
CO2: 21 mmol/L — ABNORMAL LOW (ref 22–32)
Calcium: 8.8 mg/dL — ABNORMAL LOW (ref 8.9–10.3)
Chloride: 101 mmol/L (ref 98–111)
Creatinine, Ser: 0.71 mg/dL (ref 0.61–1.24)
GFR, Estimated: >60 mL/min (ref >60)
Glucose, Bld: 104 mg/dL — ABNORMAL HIGH (ref 70–99)
Potassium: 3.8 mmol/L (ref 3.5–5.1)
Sodium: 135 mmol/L (ref 135–145)

## 2023-01-28 LAB — CBC
HCT: 39.7 % (ref 39.0–52.0)
Hemoglobin: 12.7 g/dL — ABNORMAL LOW (ref 13.0–17.0)
MCH: 26.7 pg (ref 26.0–34.0)
MCHC: 32 g/dL (ref 30.0–36.0)
MCV: 83.6 fL (ref 80.0–100.0)
Platelets: 282 10*3/uL (ref 150–400)
RBC: 4.75 MIL/uL (ref 4.22–5.81)
RDW: 15.7 % — ABNORMAL HIGH (ref 11.5–15.5)
WBC: 11.3 10*3/uL — ABNORMAL HIGH (ref 4.0–10.5)
nRBC: 0 % (ref 0.0–0.2)

## 2023-01-28 LAB — MRSA NEXT GEN BY PCR, NASAL: MRSA by PCR Next Gen: NOT DETECTED

## 2023-01-28 LAB — GLUCOSE, CAPILLARY
Glucose-Capillary: 104 mg/dL — ABNORMAL HIGH (ref 70–99)
Glucose-Capillary: 106 mg/dL — ABNORMAL HIGH (ref 70–99)
Glucose-Capillary: 112 mg/dL — ABNORMAL HIGH (ref 70–99)
Glucose-Capillary: 112 mg/dL — ABNORMAL HIGH (ref 70–99)
Glucose-Capillary: 127 mg/dL — ABNORMAL HIGH (ref 70–99)
Glucose-Capillary: 99 mg/dL (ref 70–99)

## 2023-01-28 LAB — MAGNESIUM: Magnesium: 2.1 mg/dL (ref 1.7–2.4)

## 2023-01-28 MED ORDER — LACTATED RINGERS IV SOLN: INTRAVENOUS | Status: DC

## 2023-01-28 MED ORDER — PANTOPRAZOLE SODIUM 40 MG IV SOLR
40.0000 mg | Freq: Every day | INTRAVENOUS | Status: DC
  Administered 2023-01-28 – 2023-01-30 (×3): 40 mg via INTRAVENOUS
  Filled 2023-01-28 (×3): qty 10

## 2023-01-28 MED ORDER — PHENOBARBITAL SODIUM 130 MG/ML IJ SOLN
130.0000 mg | Freq: Three times a day (TID) | INTRAMUSCULAR | Status: AC
  Administered 2023-01-28 (×3): 130 mg via INTRAVENOUS
  Filled 2023-01-28 (×3): qty 1

## 2023-01-28 MED ORDER — LABETALOL HCL 5 MG/ML IV SOLN: 10.0000 mg | INTRAVENOUS | Status: DC | PRN

## 2023-01-28 MED ORDER — PHENOBARBITAL SODIUM 65 MG/ML IJ SOLN
65.0000 mg | Freq: Every day | INTRAMUSCULAR | Status: DC
  Administered 2023-01-30 – 2023-01-31 (×2): 65 mg via INTRAVENOUS
  Filled 2023-01-28 (×2): qty 1

## 2023-01-28 MED ORDER — FOLIC ACID 5 MG/ML IJ SOLN
1.0000 mg | Freq: Every day | INTRAMUSCULAR | Status: DC
  Administered 2023-01-28 – 2023-01-29 (×2): 1 mg via INTRAVENOUS
  Filled 2023-01-28 (×3): qty 0.2

## 2023-01-28 MED ORDER — PHENOBARBITAL SODIUM 65 MG/ML IJ SOLN
65.0000 mg | Freq: Three times a day (TID) | INTRAMUSCULAR | Status: AC
  Administered 2023-01-29 (×3): 65 mg via INTRAVENOUS
  Filled 2023-01-28 (×3): qty 1

## 2023-01-28 MED ORDER — THIAMINE HCL 100 MG/ML IJ SOLN
100.0000 mg | Freq: Every day | INTRAMUSCULAR | Status: DC
  Administered 2023-01-28 – 2023-01-30 (×3): 100 mg via INTRAVENOUS
  Filled 2023-01-28 (×3): qty 2

## 2023-01-28 MED ORDER — DEXMEDETOMIDINE HCL IN NACL 400 MCG/100ML IV SOLN
0.0000 ug/kg/h | INTRAVENOUS | Status: DC
  Administered 2023-01-28: 1 ug/kg/h via INTRAVENOUS
  Administered 2023-01-28: 0.6 ug/kg/h via INTRAVENOUS
  Administered 2023-01-28: 1.2 ug/kg/h via INTRAVENOUS
  Administered 2023-01-28: 0.1 ug/kg/h via INTRAVENOUS
  Administered 2023-01-29: 0.5 ug/kg/h via INTRAVENOUS
  Filled 2023-01-28 (×5): qty 100

## 2023-01-28 NOTE — Progress Notes (Signed)
eLink Physician-Brief Progress Note Patient Name: Cole Henderson DOB: 1975-12-20 MRN: [id number]   Date of Service  01/28/2023  HPI/Events of Note  In ICU from progressive care , post not responding to  10 mg of ativan over the past 5 hours. Patient kicking at nurses and trying to pull out iv's. Patient more confused/agitated. Restraints in place. Tachy, hypertensive, and tachypneic. Transferring to icu for closer monitoring and to be started on precedex.  Admitted on 22 nd for same. HTN Schizophrenia.   Camera: Confused, able to protect airways, MAP 100, sats on nasal o2 96%. Sinus tachy lower side.   eICU Interventions  Agree for current plan of care Asp and sz precautions On MVI CBG goals < 180, SSI VTE lovenox. Follow labs     Intervention Category Major Interventions: Delirium, psychosis, severe agitation - evaluation and management Evaluation Type: New Patient Evaluation  Elmer Sow 01/28/2023, 3:04 AM

## 2023-01-28 NOTE — Progress Notes (Signed)
CCM interval note  Seen in follow up after overnight transfer to ICU for management of etoh withdrawals with severe agitation. He was started on precedex.   Asleep, comfortable appearing NCAT Pink mm Even unlabored respirations  Rrr Soft abdomen No acute joint deformity GU deferred    etoh withdrawal w dts  etoh abuse   P -incr phenobarb -wean precedex as able -add etco2 -cont micronutrient support -NPO    Additional critical care time 20 min  Eliseo Gum MSN, AGACNP-BC [place] 01/28/2023, 8:08 AM

## 2023-01-28 NOTE — Progress Notes (Addendum)
PT Cancellation Note  Patient Details Name: Cole Henderson MRN: [id number] DOB: 26-Jul-1976   Cancelled Treatment:    Reason Eval/Treat Not Completed: Fatigue/lethargy limiting ability to participate  Attempted again at 1257 but pt remains too lethargic at this time.  Sandy Salaam Piper [place] 01/28/2023, 8:57 AM [place] Office: [phone]

## 2023-01-28 NOTE — Progress Notes (Signed)
SLP Cancellation Note  Patient Details Name: Cole Henderson MRN: [id number] DOB: [birth year]   Cancelled treatment:       Reason Eval/Treat Not Completed: Fatigue/lethargy limiting ability to participate. Checked in with RN, who said that pt was starting to wake up a little as sedation was being weaned, but not sure if he will be able to stay off sedation. Will hold eval for today and plan to f/u as mentation better allows for POs.     Osie Bond., M.A. [place] Office [phone]  Secure chat preferred  01/28/2023, 3:40 PM

## 2023-01-28 NOTE — Progress Notes (Addendum)
   NAME:  Cole Henderson, MRN:  [id number], DOB:  Nov 19, 1976, LOS: 4 ADMISSION DATE:  01/23/2023, CONSULTATION DATE:  01/23/2023 REFERRING MD:  Leonette [facility], CHIEF COMPLAINT:  alcohol withdrawal   History of Present Illness:  47 year old man with severe alcohol withdrawal with delirium tremens. Normally drinks 1 gallon of whiskey but none today. Reported visual hallucinations.   2/25 pccm re-consulted for patient with worsening withdrawal symptoms. Given 10 mg of ativan over the past 5 hours. Patient kicking at nurses and trying to pull out iv's. Patient more confused/agitated. Restraints in place. Tachy, hypertensive, and tachypneic. Transferring to icu for closer monitoring and to be started on precedex.  Pertinent  Medical History   Past Medical History:  Diagnosis Date   Drug abuse ([place])    Hypertension    Schizophrenia ([place])    Tardive dyskinesia    Significant Hospital Events: Including procedures, antibiotic start and stop dates in addition to other pertinent events   2/21 admitted with delirium tremens. Phenobarb taper started. Started on precedex 2/22 weaned off precedex  Interim History / Subjective:  See above  Objective   Blood pressure (!) 143/104, pulse (!) 109, temperature 98 F (36.7 C), temperature source Oral, resp. rate (!) 24, height 5' 10"$  (1.778 m), weight 97.7 kg, SpO2 93 %.        Intake/Output Summary (Last 24 hours) at 01/28/2023 0206 Last data filed at 01/27/2023 1800 Gross per 24 hour  Intake --  Output 1450 ml  Net -1450 ml    Filed Weights   01/25/23 0402 01/26/23 0500 01/27/23 0500  Weight: 101.1 kg 101.5 kg 97.7 kg    Examination: General:  ill appearing male with withdrawal HEENT: MM pink/dry Neuro: Patient confused/agitated; follows some commands; able to state name CV: s1s2, tachy 110s, no m/r/g PULM: dim clear BS bilaterally; on room air; rr 30s GI: soft, bsx4 active  Extremities: warm/dry, no edema  Skin: no rashes or lesions    Ancillary tests recently reviewed  Unremarkable Ethoh <10 Assessment & Plan:  Alcohol withdrawal with delirium tremens Chronic alcohol abuse: drinks about 1 gallon of whiskey dailys -UDS positive for amphetamines (also benzo and barbiturates which were given in hospital) Plan: -transfer back to icu and start on precedex -cont phenobarb taper -CIWA protocol -prn ativan -consider starting klonopin  -monitor for seizures -thiamine, folic acid, mvi  Schizophrenia with prior auditory hallucinations.  -unknown if patient is taking home meds regularly. Only sertraline and Artane refilled recently Plan: -continue home meds -f/u outpt w/ psychiatry  HTN Plan: -cont clonidine -prn hydralazine  Hyperglycemia/Hypoglycemia -A1c 5.5 Plan: -cbg monitoring  Oliguria -likely dehydration Plan: -improving -monitor UOP  Hypokalemia Plan: -trend bmp  Chronic Anemia Plan: -trend cbc  Tobacco abuse Plan: -nicotine patch  Best Practice (right click and "Reselect all SmartList Selections" daily)   Diet/type: clear liquids DVT prophylaxis: LMWH GI prophylaxis: PPI Lines: N/A Foley:  N/A Code Status:  full code Last date of multidisciplinary goals of care discussion [pending]  CRITICAL CARE Performed by: Mick Sell   Total critical care time: 35 minutes   JD Rexene Agent [facility] 01/28/2023, 2:06 AM  Please see [email or web address] for pager details.  From 7A-7P if no response, please call [phone]. After hours, please call ELink [phone].

## 2023-01-28 NOTE — Progress Notes (Signed)
Patient has been increasingly agitated since his transfer from the ICU. Despite 10 mg Ativan given between 2130 and 0100, patient was still agitated, trying to get out of bed, pulling his IV and getting physically aggressive when attempt is made to reorient him. Hospitalist came to the bedside after being notified and the decision was made to transfer patient back to the ICU.

## 2023-01-29 ENCOUNTER — Inpatient Hospital Stay (HOSPITAL_COMMUNITY): Payer: 59

## 2023-01-29 DIAGNOSIS — R1310 Dysphagia, unspecified: Secondary | ICD-10-CM

## 2023-01-29 DIAGNOSIS — E441 Mild protein-calorie malnutrition: Secondary | ICD-10-CM

## 2023-01-29 LAB — CBC
HCT: 37.7 % — ABNORMAL LOW (ref 39.0–52.0)
Hemoglobin: 12.1 g/dL — ABNORMAL LOW (ref 13.0–17.0)
MCH: 26.8 pg (ref 26.0–34.0)
MCHC: 32.1 g/dL (ref 30.0–36.0)
MCV: 83.4 fL (ref 80.0–100.0)
Platelets: 262 10*3/uL (ref 150–400)
RBC: 4.52 MIL/uL (ref 4.22–5.81)
RDW: 15.5 % (ref 11.5–15.5)
WBC: 7.5 10*3/uL (ref 4.0–10.5)
nRBC: 0 % (ref 0.0–0.2)

## 2023-01-29 LAB — BASIC METABOLIC PANEL
Anion gap: 9 (ref 5–15)
BUN: 19 mg/dL (ref 6–20)
CO2: 23 mmol/L (ref 22–32)
Calcium: 8.6 mg/dL — ABNORMAL LOW (ref 8.9–10.3)
Chloride: 103 mmol/L (ref 98–111)
Creatinine, Ser: 0.84 mg/dL (ref 0.61–1.24)
GFR, Estimated: >60 mL/min (ref >60)
Glucose, Bld: 104 mg/dL — ABNORMAL HIGH (ref 70–99)
Potassium: 4.2 mmol/L (ref 3.5–5.1)
Sodium: 135 mmol/L (ref 135–145)

## 2023-01-29 LAB — GLUCOSE, CAPILLARY
Glucose-Capillary: 100 mg/dL — ABNORMAL HIGH (ref 70–99)
Glucose-Capillary: 101 mg/dL — ABNORMAL HIGH (ref 70–99)
Glucose-Capillary: 113 mg/dL — ABNORMAL HIGH (ref 70–99)
Glucose-Capillary: 93 mg/dL (ref 70–99)
Glucose-Capillary: 94 mg/dL (ref 70–99)
Glucose-Capillary: 99 mg/dL (ref 70–99)

## 2023-01-29 MED ORDER — MIDAZOLAM HCL 2 MG/2ML IJ SOLN
1.0000 mg | INTRAMUSCULAR | Status: DC | PRN
  Administered 2023-01-29 (×2): 2 mg via INTRAVENOUS
  Filled 2023-01-29 (×2): qty 2

## 2023-01-29 MED ORDER — PROSOURCE TF20 ENFIT COMPATIBL EN LIQD
60.0000 mL | Freq: Every day | ENTERAL | Status: DC
  Administered 2023-01-29 – 2023-01-31 (×3): 60 mL
  Filled 2023-01-29 (×4): qty 60

## 2023-01-29 MED ORDER — VITAL AF 1.2 CAL PO LIQD
1000.0000 mL | ORAL | Status: DC
  Administered 2023-01-29 – 2023-01-30 (×4): 1000 mL
  Filled 2023-01-29 (×5): qty 1000

## 2023-01-29 MED ORDER — VITAL HIGH PROTEIN PO LIQD: 1000.0000 mL | ORAL | Status: DC

## 2023-01-29 MED ORDER — VITAMIN B-12 1000 MCG PO TABS
1000.0000 ug | ORAL_TABLET | Freq: Every day | ORAL | Status: DC
  Administered 2023-01-29 – 2023-02-01 (×4): 1000 ug
  Filled 2023-01-29 (×4): qty 1

## 2023-01-29 MED ORDER — GABAPENTIN 100 MG PO CAPS
200.0000 mg | ORAL_CAPSULE | Freq: Three times a day (TID) | ORAL | Status: DC
  Administered 2023-01-29 – 2023-02-01 (×10): 200 mg via ORAL
  Filled 2023-01-29 (×10): qty 2

## 2023-01-29 MED ORDER — MAGIC MOUTHWASH
15.0000 mL | Freq: Three times a day (TID) | ORAL | Status: DC
  Administered 2023-01-29 – 2023-01-30 (×5): 15 mL via ORAL
  Filled 2023-01-29 (×10): qty 15

## 2023-01-29 NOTE — Progress Notes (Signed)
Off precedex this afternoon. Monitor in ICU overnight, hopefully can go to the floor tomorrow. Versed PRN for agitation ordered; hopefully can avoid restarting precedex.   Julian Hy, DO 01/29/23 4:51 PM [place] [facility]

## 2023-01-29 NOTE — Progress Notes (Signed)
Nutrition Follow-up  DOCUMENTATION CODES:   Not applicable but High Nutrition Risk  INTERVENTION:   Tube Feeding via Cortrak:  Vital AF 1.2 at 75 ml/hr Begin TF at rate of 20 ml/hr, titrate by 10 mL q 8 hours until goal rate of 75 ml/hr This provides 2160 kcals, 135 g of protein and 1458 mL of free water  Continue MVI with Minerals, Thiamine and Folic Acid. Recommend addition of B-12  NUTRITION DIAGNOSIS:   Inadequate oral intake related to social / environmental circumstances, inability to eat as evidenced by NPO status (eating poorly at baseline, 1 meal per day and 1 gallon of whiskey).  GOAL:   Patient will meet greater than or equal to 90% of their needs  MONITOR:   Diet advancement, Labs, Weight trends, TF tolerance  REASON FOR ASSESSMENT:   Consult Enteral/tube feeding initiation and management  ASSESSMENT:   47 yo male admitted with acute EtOH withdrawal-DTs. PMH includes chronic alcohol abuse, tobacco abuse, schizophrenia, HTN, tardive dyskinesia, chronic anemia  2/21 Admitted 2/24 NPO 2/26 Cortrak placed  Pt alert, calm on visit today. Pt just had Cortrak placed. Remains dysarthric but able to answer simple questions appropriately. Currently off precedex.   Pt reports mouth pain; noted thrush on tongue for which treatment has been started today SLP following, currently NPO Recorded po intake of meals on 2/22-2/23 mostly 100% (all except one meal) on Dysphagia 2, Thin Liquid diet. Pt has been NPO since 2/24.  Pt denies N/V, reports right quadrant abdominal pain, tender to palpation. Notified RN  Pt reports he eats 1 meal per day, which is usually Breakfast. He eats breakfast and then heads to work. Pt does not eat while at work and then does not eat when he gets home. Pt does report drinking 1 gallon of Whiskey per Day  Current wt 97.1 kg. Pt reports UBW around 190 pounds, current wt 214 pounds.   Micronutrient Labs 01/26/23: CRP: no results B-12:  254  (low normal)-recommend supplementation Folate: 8.3 (wdl)  Labs: reviewed Meds: MVI with Minerals, IV folic acid, IV thiamine   NUTRITION - FOCUSED PHYSICAL EXAM:  Flowsheet Row Most Recent Value  Orbital Region No depletion  Upper Arm Region No depletion  Thoracic and Lumbar Region No depletion  Buccal Region No depletion  Temple Region Mild depletion  Clavicle Bone Region No depletion  Clavicle and Acromion Bone Region No depletion  Scapular Bone Region No depletion  Dorsal Hand No depletion  Patellar Region Mild depletion  Anterior Thigh Region Mild depletion  Posterior Calf Region Mild depletion  Edema (RD Assessment) Mild       Diet Order:   Diet Order             Diet NPO time specified Except for: Ice Chips  Diet effective now                   EDUCATION NEEDS:   Not appropriate for education at this time  Skin:  Skin Assessment: Reviewed RN Assessment  Last BM:  2/23, +flatus today  Height:   Ht Readings from Last 1 Encounters:  01/23/23 '5\' 10"'$  (1.778 m)    Weight:   Wt Readings from Last 1 Encounters:  01/29/23 97.1 kg     BMI:  Body mass index is 30.72 kg/m.  Estimated Nutritional Needs:   Kcal:  2100-2300 kcals  Protein:  115-135 g  Fluid:  >/= 2L   Kerman Passey MS, RDN, LDN, CNSC Registered Dietitian  3 Clinical Nutrition RD Pager and On-Call Pager Number Located in [place]

## 2023-01-29 NOTE — Evaluation (Signed)
Physical Therapy Evaluation Patient Details Name: Cole Henderson MRN: [id number] DOB: [birth year] Today's Date: 01/29/2023  History of Present Illness  47 y/o male admitted 2/20 with cough, severe ETOH withdrawal with DTs. + amphetamines. PMHx: drug abuse, HTN, schizophrenia and Tardive dyskinesia.  Clinical Impression  Pt with flat affect, decreased vocal volume, impaired balance and decreased orientation. Pt with decreased functional mobility and transfers who will benefit from acute therapy to maximize mobility, safety and independence. Pt reports at baseline he is independent, working  and has 3 daughters.  SPo2 95% RA HR 69     Recommendations for follow up therapy are one component of a multi-disciplinary discharge planning process, led by the attending physician.  Recommendations may be updated based on patient status, additional functional criteria and insurance authorization.  Follow Up Recommendations Home health PT      Assistance Recommended at Discharge Frequent or constant Supervision/Assistance  Patient can return home with the following  A lot of help with walking and/or transfers;A lot of help with bathing/dressing/bathroom;Assist for transportation;Direct supervision/assist for medications management    Equipment Recommendations Rolling walker (2 wheels)  Recommendations for Other Services       Functional Status Assessment Patient has had a recent decline in their functional status and demonstrates the ability to make significant improvements in function in a reasonable and predictable amount of time.     Precautions / Restrictions Precautions Precautions: Fall Restrictions Weight Bearing Restrictions: No      Mobility  Bed Mobility Overal bed mobility: Needs Assistance Bed Mobility: Supine to Sit, Sit to Supine     Supine to sit: Min guard Sit to supine: Min guard   General bed mobility comments: guarding for safety and lines    Transfers Overall  transfer level: Needs assistance   Transfers: Sit to/from Stand Sit to Stand: Min assist           General transfer comment: min assist to stand pt with posterior right lean in sitting and standing with physical assist to balance and side step toward [facility]    Ambulation/Gait               General Gait Details: not yet able  Stairs            Wheelchair Mobility    Modified Rankin (Stroke Patients Only)       Balance Overall balance assessment: Needs assistance   Sitting balance-Leahy Scale: Poor Sitting balance - Comments: posterior right lean Postural control: Posterior lean, Right lateral lean Standing balance support: Single extremity supported Standing balance-Leahy Scale: Poor Standing balance comment: min assist in standing                             Pertinent Vitals/Pain Pain Assessment Pain Assessment: No/denies pain    Home Living Family/patient expects to be discharged to:: Private residence Living Arrangements: Non-relatives/Friends Available Help at Discharge: Friend(s);Available PRN/intermittently Type of Home: House Home Access: Stairs to enter   Entrance Stairs-Number of Steps: 4   Home Layout: Two level;Bed/bath upstairs Home Equipment: None Additional Comments: pt reports he lives with friend Jana Half, works in Architect and has 3 daughters    Prior Function Prior Level of Function : Independent/Modified Independent                     Journalist, newspaper        Extremity/Trunk Assessment   Upper Extremity Assessment Upper Extremity Assessment:  Generalized weakness    Lower Extremity Assessment Lower Extremity Assessment: Generalized weakness    Cervical / Trunk Assessment Cervical / Trunk Assessment: Normal  Communication   Communication: Expressive difficulties  Cognition Arousal/Alertness: Awake/alert Behavior During Therapy: Flat affect Overall Cognitive Status: Impaired/Different from  baseline Area of Impairment: Orientation, Safety/judgement                 Orientation Level: Disoriented to, Time       Safety/Judgement: Decreased awareness of safety, Decreased awareness of deficits              General Comments      Exercises     Assessment/Plan    PT Assessment Patient needs continued PT services  PT Problem List Decreased strength;Decreased mobility;Decreased safety awareness;Decreased activity tolerance;Decreased cognition;Decreased balance;Decreased knowledge of use of DME       PT Treatment Interventions Gait training;Therapeutic exercise;Patient/family education;Stair training;Balance training;Functional mobility training;Neuromuscular re-education;DME instruction;Therapeutic activities    PT Goals (Current goals can be found in the Care Plan section)  Acute Rehab PT Goals Patient Stated Goal: eat, get home PT Goal Formulation: With patient Time For Goal Achievement: 02/12/23 Potential to Achieve Goals: Good    Frequency Min 3X/week     Co-evaluation               AM-PAC PT "6 Clicks" Mobility  Outcome Measure Help needed turning from your back to your side while in a flat bed without using bedrails?: A Little Help needed moving from lying on your back to sitting on the side of a flat bed without using bedrails?: A Little Help needed moving to and from a bed to a chair (including a wheelchair)?: A Lot Help needed standing up from a chair using your arms (e.g., wheelchair or bedside chair)?: A Little Help needed to walk in hospital room?: Total Help needed climbing 3-5 steps with a railing? : Total 6 Click Score: 13    End of Session   Activity Tolerance: Patient tolerated treatment well Patient left: in bed;with call bell/phone within reach;with bed alarm set;with restraints reapplied Nurse Communication: Mobility status PT Visit Diagnosis: Other abnormalities of gait and mobility (R26.89);Muscle weakness (generalized)  (M62.81)    Time: [id number] PT Time Calculation (min) (ACUTE ONLY): 20 min   Charges:   PT Evaluation $PT Eval Moderate Complexity: 1 Mod          Verlee Pope P, PT Acute Rehabilitation Services Office: [phone]   Lamarr Lulas 01/29/2023, 9:56 AM

## 2023-01-29 NOTE — Progress Notes (Signed)
Speech Language Pathology Treatment: Dysphagia  Patient Details Name: Cole Henderson MRN: [id number] DOB: Oct 19, 1976 Today's Date: 01/29/2023 Time: [id number] SLP Time Calculation (min) (ACUTE ONLY): 18 min  Assessment / Plan / Recommendation Clinical Impression  Since pt was last seen by SLP, he had an increase in agitation requiring more sedation. As a result he had been transferred back to ICU and was made NPO. Today he is alert and following instructions. Speech remains dysarthric, with significantly imprecise consonants and hypernasality. It's hard to get a good view of his velar movement, but from what I can see, it appears to be reduced but symmetrical. A layer of secretions was removed from his hard palate with additional secretions removed from his oral cavity in general. Pt has more consistent coughing with thin liquids today and diffuse oral residue with purees, both of which are a decline in function from last SLP visit on 2/23. He required Mod cues to clear puree from his oral cavity with lingual sweep and second swallow. There are 4-5 subswallows noted with each sip of water. Recommend that he remain NPO for today except for meds crushed in puree and ice chips with staff after oral care. Discussed the above findings with MD, PA, and RN. Plan is to order magic mouth wash as well and try to facilitate oral care. Will f/u to see if he makes some improvements with focus on oral hygiene and adding moisture from ice chips, although may need to consider MBS.   HPI HPI: Pt is a 48 yo male presenting with severe alcohol withdrawal and visual hallucinations. PMH includes chronic alcohol abuse, tobacco abuse, schizophrenia, HTN, tardive dyskinesia, and chronic anemia.      SLP Plan  Continue with current plan of care      Recommendations for follow up therapy are one component of a multi-disciplinary discharge planning process, led by the attending physician.  Recommendations may be updated based  on patient status, additional functional criteria and insurance authorization.    Recommendations  Diet recommendations: NPO;Other(comment) (ice chips with staff after oral care) Medication Administration: Crushed with puree                Oral Care Recommendations: Oral care QID Follow Up Recommendations: Home health SLP Assistance recommended at discharge: Frequent or constant Supervision/Assistance SLP Visit Diagnosis: Dysphagia, unspecified (R13.10) Plan: Continue with current plan of care           Osie Bond., M.A. [place] Office [phone]  Secure chat preferred   01/29/2023, 11:06 AM

## 2023-01-29 NOTE — Procedures (Signed)
Cortrak  Person Inserting Tube:  Nashly Olsson T, RD Tube Type:  Cortrak - 43 inches Tube Size:  10 Tube Location:  Left nare Secured by: Bridle Technique Used to Measure Tube Placement:  Marking at nare/corner of mouth Cortrak Secured At:  71 cm   Cortrak Tube Team Note:  Consult received to place a Cortrak feeding tube.   X-ray is required, abdominal x-ray has been ordered by the Cortrak team. Please confirm tube placement before using the Cortrak tube.   If the tube becomes dislodged please keep the tube and contact the Cortrak team at [email or web address] for replacement.  If after hours and replacement cannot be delayed, place a NG tube and confirm placement with an abdominal x-ray.    Samson Frederic RD, LDN For contact information, refer to [facility].

## 2023-01-29 NOTE — Progress Notes (Signed)
   NAME:  Cole Henderson, MRN:  [id number], DOB:  1976/09/24, LOS: 5 ADMISSION DATE:  01/23/2023, CONSULTATION DATE:  01/23/2023 REFERRING MD:  Leonette [facility], CHIEF COMPLAINT:  alcohol withdrawal   History of Present Illness:  47 year old man with severe alcohol withdrawal with delirium tremens. Normally drinks 1 gallon of whiskey but none today. Reported visual hallucinations.   2/25 pccm re-consulted for patient with worsening withdrawal symptoms. Given 10 mg of ativan over the past 5 hours. Patient kicking at nurses and trying to pull out iv's. Patient more confused/agitated. Restraints in place. Tachy, hypertensive, and tachypneic. Transferring to icu for closer monitoring and to be started on precedex.  Pertinent  Medical History   Past Medical History:  Diagnosis Date   Drug abuse ([place])    Hypertension    Schizophrenia ([place])    Tardive dyskinesia    Significant Hospital Events: Including procedures, antibiotic start and stop dates in addition to other pertinent events   2/21 admitted with delirium tremens. Phenobarb taper started. Started on precedex 2/22 weaned off precedex 2/25 back on precedex and tx back to ICU  Interim History / Subjective:  Weaning precedex gradually Getting SLP eval now. He is asking for food.  Objective   Blood pressure 138/86, pulse 70, temperature 98 F (36.7 C), temperature source Oral, resp. rate 20, height 5' 10"$  (1.778 m), weight 97.1 kg, SpO2 98 %.        Intake/Output Summary (Last 24 hours) at 01/29/2023 [id number] Last data filed at 01/29/2023 0900 Gross per 24 hour  Intake 2021.77 ml  Output 700 ml  Net 1321.77 ml    Filed Weights   01/27/23 0500 01/28/23 0302 01/29/23 0348  Weight: 97.7 kg 97.6 kg 97.1 kg    Examination: General: Adult male, chronically ill appearing, in NAD. Neuro: A& O x 3, no deficits but does have speech alteration which is being evaluated by SLP. HEENT: [place]/AT. Sclerae anicteric. Thrush throughout  tongue. Cardiovascular: RRR, no M/R/G.  Lungs: Respirations even and unlabored.  CTA bilaterally, No W/R/R. Abdomen: BS x 4, soft, NT/ND.  Musculoskeletal: No gross deformities, no edema.  Skin: Intact, warm, no rashes.  Ancillary tests recently reviewed  Unremarkable Ethoh <10 Assessment & Plan:   Alcohol withdrawal with delirium tremens Chronic alcohol abuse: drinks about 1 gallon of whiskey daily Polysubstance abuse - UDS positive for amphetamines (also benzo and barbiturates which were given in hospital) -cont Precedex, wean as able -cont phenobarb taper -CIWA protocol -prn ativan -monitor for seizures -thiamine, folic acid, mvi  Schizophrenia with prior auditory hallucinations.  -unknown if patient is taking home meds regularly. Only sertraline and Artane refilled recently -continue home meds -f/u outpt w/ psychiatry  HTN -cont clonidine -prn hydralazine  Hypoglycemia (A1c 5.5) -cbg monitoring  Oliguria - likely dehydration -gentle fluids -monitor UOP  Tobacco abuse -nicotine patch  Dysphagia - SLP eval underway now  Oral thrush - magic mouthwash  Best Practice (right click and "Reselect all SmartList Selections" daily)   Diet/type: NPO - SLP eval now DVT prophylaxis: LMWH GI prophylaxis: PPI Lines: N/A Foley:  N/A Code Status:  full code Last date of multidisciplinary goals of care discussion [pending]   CC time: 30 min   Montey Hora, PA - [facility] For pager details, please see AMION or use Epic chat  After 1900, please call [place] for cross coverage needs 01/29/2023, 10:02 AM

## 2023-01-30 ENCOUNTER — Inpatient Hospital Stay (HOSPITAL_COMMUNITY): Payer: 59

## 2023-01-30 DIAGNOSIS — E871 Hypo-osmolality and hyponatremia: Secondary | ICD-10-CM

## 2023-01-30 DIAGNOSIS — I10 Essential (primary) hypertension: Secondary | ICD-10-CM

## 2023-01-30 LAB — CBC
HCT: 39 % (ref 39.0–52.0)
Hemoglobin: 13 g/dL (ref 13.0–17.0)
MCH: 27.5 pg (ref 26.0–34.0)
MCHC: 33.3 g/dL (ref 30.0–36.0)
MCV: 82.6 fL (ref 80.0–100.0)
Platelets: 302 10*3/uL (ref 150–400)
RBC: 4.72 MIL/uL (ref 4.22–5.81)
RDW: 15.2 % (ref 11.5–15.5)
WBC: 6 10*3/uL (ref 4.0–10.5)
nRBC: 0 % (ref 0.0–0.2)

## 2023-01-30 LAB — GLUCOSE, CAPILLARY
Glucose-Capillary: 92 mg/dL (ref 70–99)
Glucose-Capillary: 105 mg/dL — ABNORMAL HIGH (ref 70–99)
Glucose-Capillary: 111 mg/dL — ABNORMAL HIGH (ref 70–99)
Glucose-Capillary: 121 mg/dL — ABNORMAL HIGH (ref 70–99)
Glucose-Capillary: 144 mg/dL — ABNORMAL HIGH (ref 70–99)

## 2023-01-30 LAB — BASIC METABOLIC PANEL
Anion gap: 12 (ref 5–15)
BUN: 15 mg/dL (ref 6–20)
CO2: 25 mmol/L (ref 22–32)
Calcium: 8.5 mg/dL — ABNORMAL LOW (ref 8.9–10.3)
Chloride: 97 mmol/L — ABNORMAL LOW (ref 98–111)
Creatinine, Ser: 0.76 mg/dL (ref 0.61–1.24)
GFR, Estimated: >60 mL/min (ref >60)
Glucose, Bld: 101 mg/dL — ABNORMAL HIGH (ref 70–99)
Potassium: 4 mmol/L (ref 3.5–5.1)
Sodium: 134 mmol/L — ABNORMAL LOW (ref 135–145)

## 2023-01-30 LAB — PHOSPHORUS: Phosphorus: 3.9 mg/dL (ref 2.5–4.6)

## 2023-01-30 LAB — MAGNESIUM: Magnesium: 2.1 mg/dL (ref 1.7–2.4)

## 2023-01-30 MED ORDER — TRIHEXYPHENIDYL HCL 5 MG PO TABS
5.0000 mg | ORAL_TABLET | Freq: Three times a day (TID) | ORAL | Status: DC
  Administered 2023-01-30 – 2023-02-01 (×6): 5 mg
  Filled 2023-01-30 (×7): qty 1

## 2023-01-30 MED ORDER — ADULT MULTIVITAMIN W/MINERALS CH
1.0000 | ORAL_TABLET | Freq: Every day | ORAL | Status: DC
  Administered 2023-01-31 – 2023-02-01 (×2): 1
  Filled 2023-01-30 (×2): qty 1

## 2023-01-30 MED ORDER — CLONIDINE HCL 0.1 MG PO TABS
0.1000 mg | ORAL_TABLET | Freq: Two times a day (BID) | ORAL | Status: DC
  Administered 2023-01-30 – 2023-02-01 (×4): 0.1 mg
  Filled 2023-01-30 (×4): qty 1

## 2023-01-30 MED ORDER — FAMOTIDINE 20 MG PO TABS
20.0000 mg | ORAL_TABLET | Freq: Every day | ORAL | Status: DC
  Administered 2023-01-30 – 2023-01-31 (×2): 20 mg
  Filled 2023-01-30 (×2): qty 1

## 2023-01-30 MED ORDER — MIDAZOLAM HCL 2 MG/2ML IJ SOLN: 1.0000 mg | INTRAMUSCULAR | Status: DC | PRN

## 2023-01-30 MED ORDER — SERTRALINE HCL 100 MG PO TABS
100.0000 mg | ORAL_TABLET | Freq: Every day | ORAL | Status: DC
  Administered 2023-01-31 – 2023-02-01 (×2): 100 mg
  Filled 2023-01-30 (×2): qty 1

## 2023-01-30 MED ORDER — THIAMINE MONONITRATE 100 MG PO TABS
100.0000 mg | ORAL_TABLET | Freq: Every day | ORAL | Status: DC
  Administered 2023-01-31 – 2023-02-01 (×2): 100 mg
  Filled 2023-01-30 (×2): qty 1

## 2023-01-30 MED ORDER — FOLIC ACID 1 MG PO TABS
1.0000 mg | ORAL_TABLET | Freq: Every day | ORAL | Status: DC
  Administered 2023-01-30 – 2023-02-01 (×3): 1 mg
  Filled 2023-01-30 (×3): qty 1

## 2023-01-30 NOTE — Progress Notes (Signed)
Speech Language Pathology Treatment: Dysphagia  Patient Details Name: Cole Henderson MRN: [id number] DOB: 06-18-1976 Today's Date: 01/30/2023 Time: [id number] SLP Time Calculation (min) (ACUTE ONLY): 23 min  Assessment / Plan / Recommendation Clinical Impression  Pt's oral hygiene looks much improved today, with less coating on his tongue and hard palate. He has significantly less residue with purees. Coughing is noted with thin liquids still, but not as frequently. It is primarily delayed, and he is using his yankauer with Min cues to clear what he is expectorating into his mouth. Given noted improvements but with persistent signs of dysphagia and dysarthria, recommend proceeding with MBS.   HPI HPI: Pt is a 47 yo male presenting with severe alcohol withdrawal and visual hallucinations. PMH includes chronic alcohol abuse, tobacco abuse, schizophrenia, HTN, tardive dyskinesia, and chronic anemia.      SLP Plan  MBS      Recommendations for follow up therapy are one component of a multi-disciplinary discharge planning process, led by the attending physician.  Recommendations may be updated based on patient status, additional functional criteria and insurance authorization.    Recommendations  Diet recommendations: NPO;Other(comment) (ice chips with staff after oral care) Medication Administration: Via alternative means (could consider crushed in puree)                Oral Care Recommendations: Oral care QID Follow Up Recommendations: Home health SLP Assistance recommended at discharge: Frequent or constant Supervision/Assistance SLP Visit Diagnosis: Dysphagia, unspecified (R13.10) Plan: MBS           Osie Bond., M.A. [place] Office [phone]  Secure chat preferred   01/30/2023, 10:55 AM

## 2023-01-30 NOTE — Progress Notes (Signed)
Patient transferred from 2 H  via wheel chair vital completed phone and call bell by patient.

## 2023-01-30 NOTE — Progress Notes (Signed)
Physical Therapy Treatment Patient Details Name: Cole Henderson MRN: [id number] DOB: [birth year] Today's Date: 01/30/2023   History of Present Illness 47 y/o male admitted 2/20 with cough, severe ETOH withdrawal with DTs. + amphetamines. PMHx: drug abuse, HTN, schizophrenia and Tardive dyskinesia.    PT Comments    Pt remains to have soft phonation but is alert and oriented x4. Pt is impulsive with decreased safety awareness and decreased insight to deficits. Pt began gait training this date however demonstrates impaired co-ordination, impaired balance, significant lateral sway, more to the R and requires modA to maintain balance while ambulating. Acute PT to cont to follow. Anticipte pt to make daily progress.    Recommendations for follow up therapy are one component of a multi-disciplinary discharge planning process, led by the attending physician.  Recommendations may be updated based on patient status, additional functional criteria and insurance authorization.  Follow Up Recommendations  Home health PT     Assistance Recommended at Discharge Frequent or constant Supervision/Assistance  Patient can return home with the following A lot of help with walking and/or transfers;A lot of help with bathing/dressing/bathroom;Assist for transportation;Direct supervision/assist for medications management   Equipment Recommendations  Rolling walker (2 wheels)    Recommendations for Other Services       Precautions / Restrictions Precautions Precautions: Fall Restrictions Weight Bearing Restrictions: No     Mobility  Bed Mobility Overal bed mobility: Needs Assistance Bed Mobility: Supine to Sit, Sit to Supine     Supine to sit: Min guard Sit to supine: Min guard   General bed mobility comments: guarding for safety and lines due to impulsivity    Transfers Overall transfer level: Needs assistance Equipment used: 1 person hand held assist Transfers: Sit to/from Stand Sit to  Stand: Min assist           General transfer comment: min assist to stand pt with posterior lean and wide base of support, pt constantly moving feet to find balance    Ambulation/Gait Ambulation/Gait assistance: Mod assist, +2 safety/equipment (2nd person for chair follow) Gait Distance (Feet): 300 Feet Assistive device: Rolling walker (2 wheels) Gait Pattern/deviations: Step-through pattern, Decreased stride length, Staggering left, Staggering right Gait velocity: dec Gait velocity interpretation: <1.31 ft/sec, indicative of household ambulator   General Gait Details: pt initially with bilat HHA, pt with impaired coordination and posterior bias, pt given RW. The RW improved pt's ability to maintain upright position. Pt however remained to have uncoordinated stepping pattern and required modA for walker management, pt vearing more R than L but unable to amb in a straight line   Stairs             Wheelchair Mobility    Modified Rankin (Stroke Patients Only)       Balance Overall balance assessment: Needs assistance Sitting-balance support: Feet supported, No upper extremity supported Sitting balance-Leahy Scale: Fair Sitting balance - Comments: posterior right lean Postural control: Posterior lean Standing balance support: During functional activity, Bilateral upper extremity supported Standing balance-Leahy Scale: Poor Standing balance comment: pt benefits from RW for safe ambulation. Pt attempted to stand in bathroom with unilateral UE support however unable                            Cognition Arousal/Alertness: Awake/alert Behavior During Therapy: Impulsive Overall Cognitive Status: Impaired/Different from baseline Area of Impairment: Orientation, Safety/judgement  Orientation Level: Disoriented to, Time       Safety/Judgement: Decreased awareness of safety, Decreased awareness of deficits     General Comments: pt  impulsive, pt aware he was in hospital and able to state how much alcohol he drink daily        Exercises      General Comments General comments (skin integrity, edema, etc.): VSS, pt with request to use bathroom. Pt required modA for hygiene s/p BM due to inability to maintain balance in standing to complete      Pertinent Vitals/Pain Pain Assessment Pain Assessment: Faces Faces Pain Scale: Hurts a little bit Pain Location: abdominal discomfort, reports "I'm starving" reports abdominal discomfort improved s/p BM Pain Descriptors / Indicators: Discomfort    Home Living                          Prior Function            PT Goals (current goals can now be found in the care plan section) Acute Rehab PT Goals Patient Stated Goal: go home PT Goal Formulation: With patient Time For Goal Achievement: 02/12/23 Potential to Achieve Goals: Good Progress towards PT goals: Progressing toward goals    Frequency    Min 3X/week      PT Plan Current plan remains appropriate    Co-evaluation              AM-PAC PT "6 Clicks" Mobility   Outcome Measure  Help needed turning from your back to your side while in a flat bed without using bedrails?: A Little Help needed moving from lying on your back to sitting on the side of a flat bed without using bedrails?: A Little Help needed moving to and from a bed to a chair (including a wheelchair)?: A Lot Help needed standing up from a chair using your arms (e.g., wheelchair or bedside chair)?: A Little Help needed to walk in hospital room?: A Lot Help needed climbing 3-5 steps with a railing? : Total 6 Click Score: 14    End of Session Equipment Utilized During Treatment: Gait belt Activity Tolerance: Patient tolerated treatment well Patient left: in bed;with call bell/phone within reach;with bed alarm set;with nursing/sitter in room Nurse Communication: Mobility status PT Visit Diagnosis: Other abnormalities of gait  and mobility (R26.89);Muscle weakness (generalized) (M62.81)     Time: 0932-1000 PT Time Calculation (min) (ACUTE ONLY): 28 min  Charges:  $Gait Training: 8-22 mins $Therapeutic Activity: 8-22 mins                     Kittie Plater, PT, DPT Acute Rehabilitation Services Secure chat preferred Office #: [phone]    Berline Lopes 01/30/2023, 11:23 AM

## 2023-01-30 NOTE — Progress Notes (Signed)
Modified Barium Swallow Study  Patient Details  Name: Cole Henderson MRN: [id number] Date of Birth: October 23, 1976  Today's Date: 01/30/2023  Modified Barium Swallow completed.  Full report located under Chart Review in the Imaging Section.  History of Present Illness Pt is a 47 yo male presenting with severe alcohol withdrawal and visual hallucinations. PMH includes chronic alcohol abuse, tobacco abuse, schizophrenia, HTN, tardive dyskinesia, and chronic anemia.   Clinical Impression Images not available from radiology for complete review of swallow study; however, overall impressions from during the study include a primarily oral dysphagia. He has disorganized bolus control and incomplete oral clearance leading to generalized oral residue. A lot of the liquid residue in his mouth was expectorated during moments of coughing. Pt had penetration fairly consistently with thin liquids, seemingly from impaired timing, with limited view of the true vocal folds to be able to rule out aspiration. Barium was noted to at least be sitting on his vocal folds without attempts at clearance. When combined with frequent coughing at bedside with thin liquid consumption, there is still concern for intermittent aspiration. Pt had better airway protection with other consistencies tested (nectar and honey thick liquids, purees, solids). Recommend starting with mechanical soft diet and nectar thick liquids with assistance during meal especially to cue for management of oral residue and overall pacing.  Factors that may increase risk of adverse event in presence of aspiration ([place] 2021): Reduced cognitive function;Frail or deconditioned;Inadequate oral hygiene  Swallow Evaluation Recommendations Recommendations: PO diet PO Diet Recommendation: Dysphagia 3 (Mechanical soft);Mildly thick liquids (Level 2, nectar thick) Liquid Administration via: Cup;Straw Medication Administration: Crushed with  puree Supervision: Staff to assist with self-feeding;Full supervision/cueing for swallowing strategies Swallowing strategies  : Minimize environmental distractions;Slow rate;Small bites/sips;Check for pocketing or oral holding Postural changes: Position pt fully upright for meals;Stay upright 30-60 min after meals Oral care recommendations: Oral care BID (2x/day) Caregiver Recommendations: Have oral suction available      Osie Bond., M.A. [place] Office [phone]  Secure chat preferred  01/30/2023,4:22 PM

## 2023-01-30 NOTE — TOC Initial Note (Signed)
Transition of Care [facility]) - Initial/Assessment Note    Patient Details  Name: Cole Henderson MRN: [id number] Date of Birth: September 30, 1976  Transition of Care [name]) CM/SW Contact:    Bethena Roys, RN Phone Number: 01/30/2023, 9:53 AM  Clinical Narrative: Patient presented for alcohol withdrawal-initiated on Precedex gtt. Patient was discussed in rounds on yesterday and the Precedex has been discontinued. Case Manager attempted to speak with patient; however, he was unable to answer questions appropriately. Case Manager did call the daughter and awaiting call back to discuss disposition plans. Case Manager will continue to follow.                  Expected Discharge Plan: [place] Barriers to Discharge: Continued Medical Work up  Expected Discharge Plan and Services In-house Referral: Clinical Social Work Discharge Planning Services: CM Consult Post Acute Care Choice: [place] arrangements for the past 2 months: [place]: PT    Prior Living Arrangements/Services Living arrangements for the past 2 months: Single Family Home Lives with:: Relatives Patient language and need for interpreter reviewed:: Yes        Need for Family Participation in Patient Care: Yes (Comment) Care giver support system in place?: Yes (comment)   Criminal Activity/Legal Involvement Pertinent to Current Situation/Hospitalization: No - Comment as needed  Permission Sought/Granted Permission sought to share information with : Family Supports, Case Manager     Admission diagnosis:  Alcohol withdrawal delirium ([place]) [F10.931] Alcohol withdrawal syndrome, with delirium ([place]) [F10.931] Patient Active Problem List   Diagnosis Date Noted   Hypertension 01/28/2023   Alcohol withdrawal syndrome, with delirium ([place]) 01/28/2023   Oliguria 01/25/2023   Hypoglycemia 01/25/2023   Alcohol withdrawal delirium ([place]) 01/24/2023   Hypokalemia 01/24/2023    Hyperglycemia 01/24/2023   Insomnia 09/05/2022   Anxiety state 09/05/2022   Tardive dyskinesia 09/05/2022   Chronic paranoid schizophrenia ([place]) 09/04/2022   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   Schizophrenia, unspecified ([place])    MIGRAINE, UNSPEC., W/O INTRACTABLE MIGRAINE 01/31/2007   HYPERTENSION, BENIGN SYSTEMIC 01/31/2007   GASTROESOPHAGEAL REFLUX, NO ESOPHAGITIS 01/31/2007   PCP:  Patient, No Pcp Per Pharmacy:   [id number] - [place], [place] - [address] [place] [address] Phone: [phone] Fax: [phone]  [place], [place] [facility] [address] [place] [facility] [address] [address] Phone: [phone] Fax: [place], [place] [address] [place] [address] Phone: [phone] Fax: [phone]  [place], [place] [address] [place] [address] [place] [address] Phone: [phone] Fax: [phone]  Social Determinants of Health (SDOH) Social History: [place]: No Food Insecurity (09/04/2022)  Housing: Low Risk  (09/04/2022)  Transportation Needs: No Transportation Needs (09/04/2022)  Utilities: Not At Risk (09/04/2022)  Alcohol Screen: Low Risk  (09/04/2022)  Depression (PHQ2-9): Medium Risk (12/01/2020)  Tobacco Use: High Risk (01/23/2023)   Readmission Risk Interventions     No data to display

## 2023-01-30 NOTE — Progress Notes (Signed)
NAME:  Cole Henderson, MRN:  [id number], DOB:  10-28-1976, LOS: 6 ADMISSION DATE:  01/23/2023, CONSULTATION DATE:  01/23/2023 REFERRING MD:  Leonette [facility], CHIEF COMPLAINT:  alcohol withdrawal   History of Present Illness:  47 year old man with severe alcohol withdrawal with delirium tremens. Normally drinks 1 gallon of whiskey but none today. Reported visual hallucinations.   2/25 pccm re-consulted for patient with worsening withdrawal symptoms. Given 10 mg of ativan over the past 5 hours. Patient kicking at nurses and trying to pull out iv's. Patient more confused/agitated. Restraints in place. Tachy, hypertensive, and tachypneic. Transferring to icu for closer monitoring and to be started on precedex.  Pertinent  Medical History   Past Medical History:  Diagnosis Date   Drug abuse ([place])    Hypertension    Schizophrenia ([place])    Tardive dyskinesia    Significant Hospital Events: Including procedures, antibiotic start and stop dates in addition to other pertinent events   2/21 admitted with delirium tremens. Phenobarb taper started. Started on precedex 2/22 weaned off precedex 2/25 back on precedex and tx back to ICU  Interim History / Subjective:  Off Precedex since yesterday afternoon.  Required 1 dose Versed overnight. Getting repeat swallow eval now, improved from yesterday. Oral thrush improved.  Objective   Blood pressure (!) 112/94, pulse 82, temperature 97.9 F (36.6 C), temperature source Axillary, resp. rate 19, height '5\' 10"'$  (1.778 m), weight 96.8 kg, SpO2 94 %.        Intake/Output Summary (Last 24 hours) at 01/30/2023 0910 Last data filed at 01/30/2023 0800 Gross per 24 hour  Intake 862.46 ml  Output 1250 ml  Net -387.54 ml    Filed Weights   01/28/23 0302 01/29/23 0348 01/30/23 0459  Weight: 97.6 kg 97.1 kg 96.8 kg    Examination: General: Adult male, chronically ill appearing, in NAD. Neuro: A& O x 3, no deficits. Speech improved from yesterday. HEENT:  [place]/AT. Sclerae anicteric. Thrush throughout tongue but improved already since yesterday. Cardiovascular: RRR, no M/R/G.  Lungs: Respirations even and unlabored.  CTA bilaterally, No W/R/R. Abdomen: BS x 4, soft, NT/ND.  Musculoskeletal: No gross deformities, no edema.  Skin: Intact, warm, no rashes.  Ancillary tests recently reviewed  Unremarkable Ethoh <10 Assessment & Plan:   Alcohol withdrawal with delirium tremens - improved. Out of window for withdrawal now. Chronic alcohol abuse: drinks about 1 gallon of whiskey daily Polysubstance abuse - UDS positive for amphetamines (also benzo and barbiturates which were given in hospital) -cont phenobarb taper -prn Versed -monitor for seizures -thiamine, folic acid, mvi  Schizophrenia with prior auditory hallucinations. -continue home meds -f/u outpt w/ psychiatry  HTN -cont clonidine -prn hydralazine  Hypoglycemia (A1c 5.5) -cbg monitoring  Tobacco abuse -nicotine patch  Dysphagia - SLP again today, already some improvement but SLP would like MBS  Oral thrush - improved - magic mouthwash   Stable for transfer out of ICU. Will ask TRH to assume care in Am 2/28 and PCCM off  Best Practice (right click and "Reselect all SmartList Selections" daily)   Diet/type: NPO - SLP eval and MBS planned DVT prophylaxis: LMWH GI prophylaxis: PPI Lines: N/A Foley:  N/A Code Status:  full code Last date of multidisciplinary goals of care discussion [pending]   Montey Hora, [place] For pager details, please see AMION or use Epic chat  After 1900, please call [place] for cross coverage needs 01/30/2023, 9:10 AM

## 2023-01-31 DIAGNOSIS — F10931 Alcohol use, unspecified with withdrawal delirium: Secondary | ICD-10-CM | POA: Diagnosis not present

## 2023-01-31 LAB — BASIC METABOLIC PANEL
Anion gap: 9 (ref 5–15)
BUN: 15 mg/dL (ref 6–20)
CO2: 26 mmol/L (ref 22–32)
Calcium: 8.9 mg/dL (ref 8.9–10.3)
Chloride: 100 mmol/L (ref 98–111)
Creatinine, Ser: 0.95 mg/dL (ref 0.61–1.24)
GFR, Estimated: >60 mL/min (ref >60)
Glucose, Bld: 134 mg/dL — ABNORMAL HIGH (ref 70–99)
Potassium: 5 mmol/L (ref 3.5–5.1)
Sodium: 135 mmol/L (ref 135–145)

## 2023-01-31 LAB — GLUCOSE, CAPILLARY
Glucose-Capillary: 101 mg/dL — ABNORMAL HIGH (ref 70–99)
Glucose-Capillary: 112 mg/dL — ABNORMAL HIGH (ref 70–99)
Glucose-Capillary: 118 mg/dL — ABNORMAL HIGH (ref 70–99)
Glucose-Capillary: 134 mg/dL — ABNORMAL HIGH (ref 70–99)
Glucose-Capillary: 137 mg/dL — ABNORMAL HIGH (ref 70–99)
Glucose-Capillary: 96 mg/dL (ref 70–99)

## 2023-01-31 LAB — MAGNESIUM: Magnesium: 2.2 mg/dL (ref 1.7–2.4)

## 2023-01-31 LAB — PHOSPHORUS: Phosphorus: 3.7 mg/dL (ref 2.5–4.6)

## 2023-01-31 NOTE — Progress Notes (Signed)
Speech Language Pathology Treatment: Dysphagia  Patient Details Name: Cole Henderson MRN: [id number] DOB: 1976-08-19 Today's Date: 01/31/2023 Time: [id number] SLP Time Calculation (min) (ACUTE ONLY): 15 min  Assessment / Plan / Recommendation Clinical Impression  Patient seen by SLP for skilled treatment focused on dysphagia goals. When SLP arrived into room, patient had just walked down hallway back to his room. He requested that SLP change his liquids to thin so he could have some "good tea" with his dinner. Patient had two 20 ounce bottles of soda, one of which he had finished drinking. SLP then observed patient take straw sips of thin liquids (soda). Swallow initiation appeared timely and no overt s/s aspiration or penetration. SLP reviewed MBS report from previous date and it does appear that patient is significantly more alert today. SLP recommending upgrade liquids to thin consistency and will follow patient at least one more time to ensure toleration.    HPI HPI: Pt is a 47 yo male presenting with severe alcohol withdrawal and visual hallucinations. PMH includes chronic alcohol abuse, tobacco abuse, schizophrenia, HTN, tardive dyskinesia, and chronic anemia.      SLP Plan  Continue with current plan of care      Recommendations for follow up therapy are one component of a multi-disciplinary discharge planning process, led by the attending physician.  Recommendations may be updated based on patient status, additional functional criteria and insurance authorization.    Recommendations  Diet recommendations: Dysphagia 3 (mechanical soft);Thin liquid Liquids provided via: Cup;Straw Medication Administration: Whole meds with puree Supervision: Patient able to self feed Compensations: Slow rate;Small sips/bites;Minimize environmental distractions;Follow solids with liquid Postural Changes and/or Swallow Maneuvers: Seated upright 90 degrees;Upright 30-60 min after meal                 Oral Care Recommendations: Oral care BID Follow Up Recommendations: Home health SLP Assistance recommended at discharge: Intermittent Supervision/Assistance SLP Visit Diagnosis: Dysphagia, oropharyngeal phase (R13.12) Plan: Continue with current plan of care           Sonia Baller, MA, CCC-SLP Speech Therapy

## 2023-01-31 NOTE — TOC Progression Note (Signed)
Transition of Care [facility]) - Progression Note    Patient Details  Name: Cole Henderson MRN: [id number] Date of Birth: Dec 01, 1976  [facility]) CM/SW Contact  Tom-Johnson, Renea Ee, RN Phone Number: 01/31/2023, 4:25 PM  Clinical Narrative:     CM spoke with patient at bedside about discharge disposition. Home Health PT recommended, patient chose [facility] from Medicare.gov list. CM called in referral to [facility] and patient has co-pay. Patient states if he has to pay co-pay, he declines. Patient states he does not need Home Health and declines outpatient rehab.  Patient does not have a PCP, hospital f/u and new patient establishment scheduled at [facility], info on AVS.  CM will continue to follow as patient progresses with care towards discharge.        Expected Discharge Plan: [place] Barriers to Discharge: Continued Medical Work up  Expected Discharge Plan and Services In-house Referral: Clinical Social Work Discharge Planning Services: CM Consult Post Acute Care Choice: [place] arrangements for the past 2 months: [place]: PT           Social Determinants of Health (SDOH) Interventions SDOH Screenings   Food Insecurity: No Food Insecurity (09/04/2022)  Housing: Low Risk  (09/04/2022)  Transportation Needs: No Transportation Needs (09/04/2022)  Utilities: Not At Risk (09/04/2022)  Alcohol Screen: Low Risk  (09/04/2022)  Depression (PHQ2-9): Medium Risk (12/01/2020)  Tobacco Use: High Risk (01/23/2023)    Readmission Risk Interventions     No data to display

## 2023-01-31 NOTE — Progress Notes (Signed)
Physical Therapy Treatment Patient Details Name: Cole Henderson MRN: [id number] DOB: 12-14-1975 Today's Date: 01/31/2023   History of Present Illness 47 y/o male admitted 2/20 with cough, severe ETOH withdrawal with DTs. + amphetamines. PMHx: drug abuse, HTN, schizophrenia and Tardive dyskinesia.    PT Comments    Pt greeted seated EOB and eager for mobility with excellent progress towards acute goals. Pt able to come to stand with min guard for safety without AD demonstrating fair stability on rise. Pt progressing gait without AD this session needing grossly min guard throughout with min assist secondary to x1 lateral LOB during horizontal head turns with pt able to correct with stepping strategy and light assist to steady. Pt able to ascend/descend steps in stairwell without fault. Pt A&O x4 throughout session with improved insight into deficits, verbalizing need for increased safety awareness throughout mobility secondary to balance deficits. Despite great progress pt continues to remain at risk for falls, scoring 18/24 on DGI. Current plan remains appropriate to address deficits and maximize functional independence and safety. Pt continues to benefit from skilled PT services to progress toward functional mobility goals.    Recommendations for follow up therapy are one component of a multi-disciplinary discharge planning process, led by the attending physician.  Recommendations may be updated based on patient status, additional functional criteria and insurance authorization.  Follow Up Recommendations  Home health PT     Assistance Recommended at Discharge Frequent or constant Supervision/Assistance  Patient can return home with the following A lot of help with walking and/or transfers;A lot of help with bathing/dressing/bathroom;Assist for transportation;Direct supervision/assist for medications management   Equipment Recommendations  Rolling walker (2 wheels)    Recommendations for Other  Services       Precautions / Restrictions Precautions Precautions: Fall;Other (comment) Precaution Comments: coretrack Restrictions Weight Bearing Restrictions: No     Mobility  Bed Mobility Overal bed mobility: Needs Assistance             General bed mobility comments: pt seated EOB pre and post session    Transfers Overall transfer level: Needs assistance Equipment used: None Transfers: Sit to/from Stand Sit to Stand: Min guard           General transfer comment: min guard for safety    Ambulation/Gait Ambulation/Gait assistance: Min guard, Min assist Gait Distance (Feet): 440 Feet Assistive device: None Gait Pattern/deviations: Step-through pattern, Decreased stride length, Staggering left, Staggering right Gait velocity: WFL     General Gait Details: mildly unsteady gait with no AD, x1 LOB with initiation of head turns with pt able to correct with stepping strategy, pt stating LLE 1.5" longer than right and unable to afford shoe lift, evident in gait pattern but pt with good compensation   Stairs Stairs: Yes Stairs assistance: Min guard Stair Management: One rail Left, Alternating pattern, Forwards Number of Stairs: 9 General stair comments: up/down steps in stairwell with no LOB   Wheelchair Mobility    Modified Rankin (Stroke Patients Only)       Balance Overall balance assessment: Needs assistance Sitting-balance support: Feet supported, No upper extremity supported Sitting balance-Leahy Scale: Good     Standing balance support: During functional activity, Bilateral upper extremity supported Standing balance-Leahy Scale: Fair Standing balance comment: able to ambulate without LOB with no UE support                 Standardized Balance Assessment Standardized Balance Assessment : Dynamic Gait Index   Dynamic Gait Index Level  Surface: Normal Change in Gait Speed: Normal Gait with Horizontal Head Turns: Moderate  Impairment Gait with Vertical Head Turns: Mild Impairment Gait and Pivot Turn: Mild Impairment Step Over Obstacle: Mild Impairment Step Around Obstacles: Normal Steps: Mild Impairment Total Score: 18      Cognition Arousal/Alertness: Awake/alert Behavior During Therapy: WFL for tasks assessed/performed Overall Cognitive Status: Within Functional Limits for tasks assessed Area of Impairment: Safety/judgement                               General Comments: A&Ox4 slightly impulsive but receptive to cues, improved safety awarness        Exercises      General Comments General comments (skin integrity, edema, etc.): VSS on RA      Pertinent Vitals/Pain Pain Assessment Pain Assessment: Faces Faces Pain Scale: No hurt Pain Intervention(s): Monitored during session    Home Living                          Prior Function            PT Goals (current goals can now be found in the care plan section) Acute Rehab PT Goals Patient Stated Goal: go home PT Goal Formulation: With patient Time For Goal Achievement: 02/12/23 Progress towards PT goals: Progressing toward goals    Frequency    Min 3X/week      PT Plan Current plan remains appropriate    Co-evaluation              AM-PAC PT "6 Clicks" Mobility   Outcome Measure  Help needed turning from your back to your side while in a flat bed without using bedrails?: A Little Help needed moving from lying on your back to sitting on the side of a flat bed without using bedrails?: A Little Help needed moving to and from a bed to a chair (including a wheelchair)?: A Lot Help needed standing up from a chair using your arms (e.g., wheelchair or bedside chair)?: A Little Help needed to walk in hospital room?: A Little Help needed climbing 3-5 steps with a railing? : A Little 6 Click Score: 17    End of Session Equipment Utilized During Treatment: Gait belt Activity Tolerance: Patient  tolerated treatment well Patient left: in bed;with call bell/phone within reach;with nursing/sitter in room (seated EOB) Nurse Communication: Mobility status PT Visit Diagnosis: Other abnormalities of gait and mobility (R26.89);Muscle weakness (generalized) (M62.81)     Time: [id number] PT Time Calculation (min) (ACUTE ONLY): 16 min  Charges:  $Gait Training: 8-22 mins                     Terianna Peggs R. PTA Acute Rehabilitation Services Office: [place] 01/31/2023, 12:04 PM

## 2023-01-31 NOTE — Progress Notes (Signed)
PROGRESS NOTE    Cole Henderson  [id number] DOB: 06/25/1976 DOA: 01/23/2023 PCP: Patient, No Pcp Per   Brief Narrative:  47 year old male with history of hypertension, schizophrenia, tardive dyskinesia, drug abuse, ankle abuse presented with severe alcohol withdrawal with delirium tremens and visual hallucinations; apparently drinks 1 gallon of whiskey every day at home. He was admitted to ICU and started on Precedex drip. UDS positive for amphetamines. He was also subsequently started on oral phenobarbital. Since then, his Precedex has been stopped. He was transferred to [facility] service from 01/26/2023 onwards but then patient's condition worsened, more alcohol withdrawal was noted, he required to be transferred back to ICU and required to be back on Precedex drip which was eventually stopped on 01/29/2023 and patient was once again transferred under [place] on 01/31/2023.  Assessment & Plan:   Principal Problem:   Alcohol withdrawal delirium (HCC) Active Problems:   Hypokalemia   Hyperglycemia   Oliguria   Hypoglycemia   Hypertension   Alcohol withdrawal syndrome, with delirium ([place])   Hyponatremia  Alcohol abuse with delirium tremens: Patient required Precedex drip twice which was eventually stopped on 01/29/2023, currently on phenobarbital taper as well as clonidine.  Schizophrenia and history of TD: Continue PTA Zoloft, Atarax, gabapentin, holding PTA fluphenazine due to extensive risk of DDI with MAOIs.   Malnutrition/dysphagia: Patient underwent MBS on 01/30/2023, was found to have oral dysphagia and at risk of intermittent aspiration, currently on mechanical soft diet as well as on tube feeds.  Patient claims that he has consumed 100% of last 3 meals and wants his tube to be taken out.  I have discussed this with RD, they are going to remove the tube but recommend keeping him overnight to make sure that he is indeed consuming 100% of the meals and will not need alternative means of  nutrition.  Patient is agreeable with that plan.  Essential hypertension: Controlled.  Currently on clonidine.  Mild hyponatremia: Monitor.  DVT prophylaxis: enoxaparin (LOVENOX) injection 40 mg Start: 01/24/23 0030   Code Status: Full Code  Family Communication:  None present at bedside.  Plan of care discussed with patient in length and he/she verbalized understanding and agreed with it.  Status is: Inpatient Remains inpatient appropriate because: Still with mild dysphagia, reasons mentioned above.   Estimated body mass index is 30.62 kg/m as calculated from the following:   Height as of this encounter: '5\' 10"'$  (1.778 m).   Weight as of this encounter: 96.8 kg.    Nutritional Assessment: Body mass index is 30.62 kg/m.Marland Kitchen Seen by dietician.  I agree with the assessment and plan as outlined below: Nutrition Status: Nutrition Problem: Inadequate oral intake Etiology: social / environmental circumstances, inability to eat Signs/Symptoms: NPO status (eating poorly at baseline, 1 meal per day and 1 gallon of whiskey) Interventions: MVI, Refer to RD note for recommendations, Tube feeding  . Skin Assessment: I have examined the patient's skin and I agree with the wound assessment as performed by the wound care RN as outlined below:    Consultants:  None  Procedures:  None  Antimicrobials:  Anti-infectives (From admission, onward)    None         Subjective: Patient seen and examined.  He has no complaints.  Objective: Vitals:   01/30/23 1800 01/30/23 1834 01/30/23 2007 01/31/23 0449  BP: (!) 133/96 138/87 138/83 137/79  Pulse:  89 97 77  Resp: '18  18 18  '$ Temp:  97.7 F (36.5 C) 98.2 F (  36.8 C) 97.8 F (36.6 C)  TempSrc:  Oral Oral Oral  SpO2:  96% 98% 99%  Weight:      Height:        Intake/Output Summary (Last 24 hours) at 01/31/2023 0807 Last data filed at 01/31/2023 0356 Gross per 24 hour  Intake 1157.34 ml  Output 1400 ml  Net -242.66 ml   Filed  Weights   01/28/23 0302 01/29/23 0348 01/30/23 0459  Weight: 97.6 kg 97.1 kg 96.8 kg    Examination:  General exam: Appears calm and comfortable  Respiratory system: Clear to auscultation. Respiratory effort normal. Cardiovascular system: S1 & S2 heard, RRR. No JVD, murmurs, rubs, gallops or clicks. No pedal edema. Gastrointestinal system: Abdomen is nondistended, soft and nontender. No organomegaly or masses felt. Normal bowel sounds heard. Central nervous system: Alert and oriented. No focal neurological deficits. Extremities: Symmetric 5 x 5 power. Skin: No rashes, lesions or ulcers Psychiatry: Judgement and insight appear normal. Mood & affect appropriate.    Data Reviewed: I have personally reviewed following labs and imaging studies  CBC: Recent Labs  Lab 01/25/23 0145 01/26/23 0137 01/28/23 0250 01/29/23 0039 01/30/23 0553  WBC 7.2 6.7 11.3* 7.5 6.0  HGB 10.4* 10.3* 12.7* 12.1* 13.0  HCT 32.8* 31.8* 39.7 37.7* 39.0  MCV 85.4 84.4 83.6 83.4 82.6  PLT 206 204 282 262 [id number]   Basic Metabolic Panel: Recent Labs  Lab 01/25/23 0145 01/26/23 0137 01/27/23 0025 01/28/23 0250 01/29/23 0039 01/30/23 0553  NA 138 136 135 135 135 134*  K 3.4* 3.8 3.7 3.8 4.2 4.0  CL 105 101 101 101 103 97*  CO2 '24 26 24 '$ 21* 23 25  GLUCOSE 86 112* 114* 104* 104* 101*  BUN '13 10 6 11 19 15  '$ CREATININE 0.77 0.74 0.76 0.71 0.84 0.76  CALCIUM 8.3* 8.4* 8.9 8.8* 8.6* 8.5*  MG 1.9 2.1 2.1 2.1  --  2.1  PHOS  --   --   --   --   --  3.9   GFR: Estimated Creatinine Clearance: 133.2 mL/min (by C-G formula based on SCr of 0.76 mg/dL). Liver Function Tests: No results for input(s): "AST", "ALT", "ALKPHOS", "BILITOT", "PROT", "ALBUMIN" in the last 168 hours. No results for input(s): "LIPASE", "AMYLASE" in the last 168 hours. Recent Labs  Lab 01/25/23 0145  AMMONIA 35   Coagulation Profile: No results for input(s): "INR", "PROTIME" in the last 168 hours. Cardiac Enzymes: No results for  input(s): "CKTOTAL", "CKMB", "CKMBINDEX", "TROPONINI" in the last 168 hours. BNP (last 3 results) No results for input(s): "PROBNP" in the last 8760 hours. HbA1C: No results for input(s): "HGBA1C" in the last 72 hours. CBG: Recent Labs  Lab 01/30/23 1506 01/30/23 2234 01/31/23 0004 01/31/23 0354 01/31/23 0758  GLUCAP 121* 144* 134* 101* 137*   Lipid Profile: No results for input(s): "CHOL", "HDL", "LDLCALC", "TRIG", "CHOLHDL", "LDLDIRECT" in the last 72 hours. Thyroid Function Tests: No results for input(s): "TSH", "T4TOTAL", "FREET4", "T3FREE", "THYROIDAB" in the last 72 hours. Anemia Panel: No results for input(s): "VITAMINB12", "FOLATE", "FERRITIN", "TIBC", "IRON", "RETICCTPCT" in the last 72 hours. Sepsis Labs: No results for input(s): "PROCALCITON", "LATICACIDVEN" in the last 168 hours.  Recent Results (from the past 240 hour(s))  Resp panel by RT-PCR (RSV, Flu A&B, Covid) Anterior Nasal Swab     Status: None   Collection Time: 01/23/23  9:01 PM   Specimen: Anterior Nasal Swab  Result Value Ref Range Status   SARS Coronavirus 2 by RT  PCR NEGATIVE NEGATIVE Final   Influenza A by PCR NEGATIVE NEGATIVE Final   Influenza B by PCR NEGATIVE NEGATIVE Final    Comment: (NOTE) The Xpert Xpress SARS-CoV-2/FLU/RSV plus assay is intended as an aid in the diagnosis of influenza from Nasopharyngeal swab specimens and should not be used as a sole basis for treatment. Nasal washings and aspirates are unacceptable for Xpert Xpress SARS-CoV-2/FLU/RSV testing.  Fact Sheet for Patients: EntrepreneurPulse.com.au  Fact Sheet for Healthcare Providers: IncredibleEmployment.be  This test is not yet approved or cleared by the [place] FDA and has been authorized for detection and/or diagnosis of SARS-CoV-2 by FDA under an Emergency Use Authorization (EUA). This EUA will remain in effect (meaning this test can be used) for the duration of  the COVID-19 declaration under Section 564(b)(1) of the Act, 21 U.S.C. section 360bbb-3(b)(1), unless the authorization is terminated or revoked.     Resp Syncytial Virus by PCR NEGATIVE NEGATIVE Final    Comment: (NOTE) Fact Sheet for Patients: EntrepreneurPulse.com.au  Fact Sheet for Healthcare Providers: IncredibleEmployment.be  This test is not yet approved or cleared by the [place] FDA and has been authorized for detection and/or diagnosis of SARS-CoV-2 by FDA under an Emergency Use Authorization (EUA). This EUA will remain in effect (meaning this test can be used) for the duration of the COVID-19 declaration under Section 564(b)(1) of the Act, 21 U.S.C. section 360bbb-3(b)(1), unless the authorization is terminated or revoked.  Performed at [facility], [place] [address], [place], [place] [address]   MRSA Next Gen by PCR, Nasal     Status: None   Collection Time: 01/24/23  1:45 AM   Specimen: Nasal Mucosa; Nasal Swab  Result Value Ref Range Status   MRSA by PCR Next Gen NOT DETECTED NOT DETECTED Final    Comment: (NOTE) The GeneXpert MRSA Assay (FDA approved for NASAL specimens only), is one component of a comprehensive MRSA colonization surveillance program. It is not intended to diagnose MRSA infection nor to guide or monitor treatment for MRSA infections. Test performance is not FDA approved in patients less than 69 years old. Performed at [facility], [place] [address] [place], [place] [address]   MRSA Next Gen by PCR, Nasal     Status: None   Collection Time: 01/28/23  2:51 AM   Specimen: Nasal Mucosa; Nasal Swab  Result Value Ref Range Status   MRSA by PCR Next Gen NOT DETECTED NOT DETECTED Final    Comment: (NOTE) The GeneXpert MRSA Assay (FDA approved for NASAL specimens only), is one component of a comprehensive MRSA colonization surveillance program. It is not intended to diagnose MRSA infection nor to  guide or monitor treatment for MRSA infections. Test performance is not FDA approved in patients less than 78 years old. Performed at [facility], [place] [address], [place], [place] [address]      Radiology Studies: DG Swallowing Func-Speech Pathology  Result Date: 01/30/2023 Table formatting from the original result was not included. Modified Barium Swallow Study Patient Details Name: Orson Gericke MRN: [id number] Date of Birth: 1976/03/05 Today's Date: 01/30/2023 HPI/PMH: HPI: Pt is a 47 yo male presenting with severe alcohol withdrawal and visual hallucinations. PMH includes chronic alcohol abuse, tobacco abuse, schizophrenia, HTN, tardive dyskinesia, and chronic anemia. Clinical Impression: Clinical Impression: Images not available from radiology for complete review of swallow study; however, overall impressions from during the study include a primarily oral dysphagia. He has disorganized bolus control and incomplete oral  clearance leading to generalized oral residue. A lot of the liquid residue in his mouth was expectorated during moments of coughing. Pt had penetration fairly consistently with thin liquids, seemingly from impaired timing, with limited view of the true vocal folds to be able to rule out aspiration. Barium was noted to at least be sitting on his vocal folds without attempts at clearance. When combined with frequent coughing at bedside with thin liquid consumption, there is still concern for intermittent aspiration. Pt had better airway protection with other consistencies tested (nectar and honey thick liquids, purees, solids). Recommend starting with mechanical soft diet and nectar thick liquids with assistance during meal especially to cue for management of oral residue and overall pacing. Factors that may increase risk of adverse event in presence of aspiration ([place] 2021): Factors that may increase risk of adverse event in presence of aspiration ([place]  2021): Reduced cognitive function; Frail or deconditioned; Inadequate oral hygiene Recommendations/Plan: Swallowing Evaluation Recommendations Swallowing Evaluation Recommendations Recommendations: PO diet PO Diet Recommendation: Dysphagia 3 (Mechanical soft); Mildly thick liquids (Level 2, nectar thick) Liquid Administration via: Cup; Straw Medication Administration: Crushed with puree Supervision: Staff to assist with self-feeding; Full supervision/cueing for swallowing strategies Swallowing strategies  : Minimize environmental distractions; Slow rate; Small bites/sips; Check for pocketing or oral holding Postural changes: Position pt fully upright for meals; Stay upright 30-60 min after meals Oral care recommendations: Oral care BID (2x/day) Caregiver Recommendations: Have oral suction available Treatment Plan Treatment Plan Treatment recommendations: Therapy as outlined in treatment plan below Follow-up recommendations: Home health SLP Functional status assessment: Patient has had a recent decline in their functional status and demonstrates the ability to make significant improvements in function in a reasonable and predictable amount of time. Treatment frequency: Min 2x/week Treatment duration: 2 weeks Interventions: Aspiration precaution training; Compensatory techniques; Patient/family education; Trials of upgraded texture/liquids; Diet toleration management by SLP Recommendations Recommendations for follow up therapy are one component of a multi-disciplinary discharge planning process, led by the attending physician.  Recommendations may be updated based on patient status, additional functional criteria and insurance authorization. Assessment: Orofacial Exam: Orofacial Exam Oral Cavity: Oral Hygiene: Pooled secretions; Lingual coating Oral Cavity - Dentition: Poor condition; Missing dentition Orofacial Anatomy: WFL Anatomy: No data recorded Thin Liquids: No data recorded Mildly Thick Liquids: No data  recorded Moderately Thick Liquids: No data recorded Puree: No data recorded Solid: No data recorded Pill: No data recorded Compensatory Strategies: No data recorded  General Information: Caregiver present: No  Diet Prior to this Study: Cortrak/Small bore NG tube; NPO   Temperature : Normal   Respiratory Status: WFL   Supplemental O2: None (Room air)   History of Recent Intubation: No  Behavior/Cognition: Alert; Cooperative; Requires cueing Self-Feeding Abilities: Needs assist with self-feeding Baseline vocal quality/speech: Abnormal resonance Volitional Cough: Able to elicit Volitional Swallow: Able to elicit No data recorded Goal Planning: Prognosis for improved oropharyngeal function: Good Barriers to Reach Goals: Cognitive deficits No data recorded Patient/Family Stated Goal: to eat and drink Consulted and agree with results and recommendations: Patient; Nurse Pain: Pain Assessment Pain Assessment: Faces Faces Pain Scale: 0 Facial Expression: 0 Body Movements: 0 Muscle Tension: 0 Compliance with ventilator (intubated pts.): N/A Vocalization (extubated pts.): N/A CPOT Total: 0 Pain Location: abdominal discomfort, reports "I'm starving" reports abdominal discomfort improved s/p BM Pain Descriptors / Indicators: Discomfort End of Session: Start Time:SLP Start Time (ACUTE ONLY): 1341 Stop Time: SLP Stop Time (ACUTE ONLY): 1401 Time Calculation:SLP  Time Calculation (min) (ACUTE ONLY): 20 min Charges: SLP Evaluations $ SLP Speech Visit: 1 Visit SLP Evaluations $BSS Swallow: 1 Procedure $MBS Swallow: 1 Procedure $Swallowing Treatment: 1 Procedure SLP visit diagnosis: SLP Visit Diagnosis: Dysphagia, oropharyngeal phase (R13.12) Past Medical History: Past Medical History: Diagnosis Date  Drug abuse ([place])   Hypertension   Schizophrenia ([place])   Tardive dyskinesia  Past Surgical History: No past surgical history on file. Osie Bond., M.A. [place] Office [phone] Secure chat preferred  01/30/2023, 4:26 PM  DG Abd Portable 1V  Result Date: 01/29/2023 CLINICAL DATA:  Encounter for feeding tube placement EXAM: PORTABLE ABDOMEN - 1 VIEW COMPARISON:  Portable exam 1128 hours compared to 04/21/2004 FINDINGS: Tip of feeding tube projects over duodenal bulb. Visualized bowel gas pattern normal. Scattered endplate spur formation thoracic spine. IMPRESSION: Tip of feeding tube projects over duodenal bulb. Electronically Signed   By: Lavonia Dana M.D.   On: 01/29/2023 11:59    Scheduled Meds:  Chlorhexidine Gluconate Cloth  6 each Topical Q0600   cloNIDine  0.1 mg Per Tube BID   vitamin B-12  1,000 mcg Per Tube Daily   enoxaparin (LOVENOX) injection  40 mg Subcutaneous Q24H   famotidine  20 mg Per Tube QHS   feeding supplement (PROSource TF20)  60 mL Per Tube Daily   folic acid  1 mg Per Tube Daily   gabapentin  200 mg Oral TID   magic mouthwash  15 mL Oral TID   multivitamin with minerals  1 tablet Per Tube Daily   nicotine  21 mg Transdermal Daily   mouth rinse  15 mL Mouth Rinse 4 times per day   PHENObarbital  65 mg Intravenous QHS   sertraline  100 mg Per Tube Daily   thiamine  100 mg Per Tube Daily   trihexyphenidyl  5 mg Per Tube TID   Continuous Infusions:  feeding supplement (VITAL AF 1.2 CAL) 75 mL/hr at 01/31/23 0356     LOS: 7 days   Darliss Cheney, MD [facility]  01/31/2023, 8:07 AM   *Please note that this is a verbal dictation therefore any spelling or grammatical errors are due to the "[place]" system interpretation.  Please page via [place] and do not message via secure chat for urgent patient care matters. Secure chat can be used for non urgent patient care matters.  How to contact the [facility] Attending or Consulting provider [place] or covering provider during after hours [place], for this patient?  Check the care team in [facility] and look for a) attending/consulting [facility] provider listed and b) the [facility] team listed. Page or secure chat [phone]. Log  into [email or web address] and use [place]'s universal password to access. If you do not have the password, please contact the hospital operator. Locate the [facility] provider you are looking for under [facility] and page to a number that you can be directly reached. If you still have difficulty reaching the provider, please page the [facility] (Director on Call) for the Hospitalists listed on amion for assistance.

## 2023-02-01 LAB — GLUCOSE, CAPILLARY: Glucose-Capillary: 97 mg/dL (ref 70–99)

## 2023-02-01 MED ORDER — AMLODIPINE BESYLATE 5 MG PO TABS: 5.0000 mg | ORAL_TABLET | Freq: Every day | ORAL | 0 refills | Status: AC

## 2023-02-01 NOTE — Plan of Care (Signed)

## 2023-02-01 NOTE — Progress Notes (Addendum)
Pt. refusing Lovenox, telemetry, bed alarm. Education provided, will continue to monitor.   0700 pt. Left unit to smoke despite his awareness of no smoking policy. Pt. Found by staff coming in for shift. When told no smoking on campus, pt. Returned cursing and hostile to staff. Security alerted. Pt. wanted to leave AMA, changed his mind. MD paged.

## 2023-02-01 NOTE — Discharge Summary (Signed)
Physician Discharge Summary  Emil Stahlnecker [id number] DOB: 12-06-1975 DOA: 01/23/2023  PCP: Patient, No Pcp Per  Admit date: 01/23/2023 Discharge date: 02/01/2023 30 Day Unplanned Readmission Risk Score    Flowsheet Row ED to Hosp-Admission (Current) from 01/23/2023 in [facility]M KIDNEY UNIT  30 Day Unplanned Readmission Risk Score (%) 27.93 Filed at 02/01/2023 0801       This score is the patient's risk of an unplanned readmission within 30 days of being discharged (0 -100%). The score is based on dignosis, age, lab data, medications, orders, and past utilization.   Low:  0-14.9   Medium: 15-21.9   High: 22-29.9   Extreme: 30 and above          Admitted From: Home Disposition: Home  Recommendations for Outpatient Follow-up:  Follow up with PCP in 1-2 weeks Please obtain BMP/CBC in one week Please follow up with your PCP on the following pending results: Unresulted Labs (From admission, onward)     Start     Ordered   01/31/23 0500  Creatinine, serum  (enoxaparin (LOVENOX)    CrCl >/= 30 ml/min)  Weekly,   R     Comments: while on enoxaparin therapy    01/24/23 0024   01/30/23 0500  Magnesium  (ICU Tube Feeding: PEPuP )  Daily,   R     Question:  Specimen collection method  Answer:  Lab=Lab collect   01/29/23 1056   01/30/23 0500  Phosphorus  (ICU Tube Feeding: PEPuP )  Daily,   R     Question:  Specimen collection method  Answer:  Lab=Lab collect   01/29/23 [place]: None Equipment/Devices: None  Discharge Condition: Stable CODE STATUS: Full code Diet recommendation: Cardiac  Subjective: Seen and examined.  No complaints.  Eager to go home.  Has been consuming 100% of the meals for last 2 days.  Brief/Interim Summary: 47 year old male with history of hypertension, schizophrenia, tardive dyskinesia, drug abuse, ankle abuse presented with severe alcohol withdrawal with delirium tremens and visual hallucinations; apparently  drinks 1 gallon of whiskey every day at home. He was admitted to ICU and started on Precedex drip. UDS positive for amphetamines. He was also subsequently started on oral phenobarbital. Since then, his Precedex has been stopped. He was transferred to [facility] service from 01/26/2023 onwards but then patient's condition worsened, more alcohol withdrawal was noted, he required to be transferred back to ICU and required to be back on Precedex drip which was eventually stopped on 01/29/2023 and patient was once again transferred under [place] on 01/31/2023.   Alcohol abuse with delirium tremens: Patient required Precedex drip twice which was eventually stopped on 01/29/2023, currently on phenobarbital taper as well as clonidine.  Has not had any withdrawal symptoms since last 2 days.  Discharging, will not prescribe clonidine or phenobarbital.   Schizophrenia and history of TD: Continue PTA Zoloft, Atarax, gabapentin, holding PTA fluphenazine due to extensive risk of DDI with MAOIs.    Malnutrition/dysphagia: Patient underwent MBS on 01/30/2023, was found to have oral dysphagia and at risk of intermittent aspiration, was started on mechanical soft diet as well as tube feedings, patient then started consuming 100% of meals and has been consuming 100% of meals for last 2 days, tube was removed yesterday, dietitian cleared him, he wants to go home and has no trouble swallowing so he is going to be discharged home today.  Essential hypertension: Controlled.  Currently on clonidine.  But discharging on amlodipine 5 mg p.o. daily.   Mild hyponatremia: Resolved.  Discharge plan was discussed with patient and/or family member and they verbalized understanding and agreed with it.  Discharge Diagnoses:  Principal Problem:   Alcohol withdrawal delirium (HCC) Active Problems:   Hypokalemia   Hyperglycemia   Oliguria   Hypoglycemia   Hypertension   Alcohol withdrawal syndrome, with delirium ([place])    Hyponatremia    Discharge Instructions   Allergies as of 02/01/2023       Reactions   Benztropine Mesylate Other (See Comments)   Unknown   Fish Allergy Anaphylaxis   Haloperidol Other (See Comments)   unknown   Haloperidol Lactate Nausea And Vomiting   Shellfish Allergy Anaphylaxis   Cephalexin Nausea And Vomiting   Cogentin [benztropine] Hives   Ingrezza [valbenazine Tosylate] Hives   Risperidone Other (See Comments)   unknown   Risperidone And Related Other (See Comments)   Breasts grew   Strawberry Extract Hives   Geodon [ziprasidone] Other (See Comments)   Extended erection (6 hours)        Medication List     TAKE these medications    amLODipine 5 MG tablet Commonly known as: NORVASC Take 1 tablet (5 mg total) by mouth daily.   fluPHENAZine 10 MG tablet Commonly known as: PROLIXIN Take 1 tablet (10 mg total) by mouth 2 (two) times daily.   gabapentin 100 MG capsule Commonly known as: NEURONTIN Take 2 capsules (200 mg total) by mouth 3 (three) times daily.   hydrOXYzine 25 MG tablet Commonly known as: ATARAX Take 1 tablet (25 mg total) by mouth every 6 (six) hours as needed for anxiety.   nicotine 21 mg/24hr patch Commonly known as: NICODERM CQ - dosed in mg/24 hours Place 1 patch (21 mg total) onto the skin daily.   sertraline 100 MG tablet Commonly known as: ZOLOFT Take 1 tablet (100 mg total) by mouth daily.   traZODone 50 MG tablet Commonly known as: DESYREL Take 1 tablet (50 mg total) by mouth at bedtime as needed for sleep.   trihexyphenidyl 5 MG tablet Commonly known as: ARTANE Take 1 tablet (5 mg total) by mouth 3 (three) times daily. What changed: when to take this        Follow-up Information     PCP Follow up in 1 week(s).                 Allergies  Allergen Reactions   Benztropine Mesylate Other (See Comments)    Unknown   Fish Allergy Anaphylaxis   Haloperidol Other (See Comments)    unknown   Haloperidol  Lactate Nausea And Vomiting   Shellfish Allergy Anaphylaxis   Cephalexin Nausea And Vomiting   Cogentin [Benztropine] Hives   Ingrezza [Valbenazine Tosylate] Hives   Risperidone Other (See Comments)    unknown   Risperidone And Related Other (See Comments)    Breasts grew   Strawberry Extract Hives   Geodon [Ziprasidone] Other (See Comments)    Extended erection (6 hours)    Consultations: PCCM   Procedures/Studies: DG Swallowing Func-Speech Pathology  Result Date: 01/30/2023 Table formatting from the original result was not included. Modified Barium Swallow Study Patient Details Name: Senan Landsiedel MRN: [id number] Date of Birth: 23-Jun-1976 Today's Date: 01/30/2023 HPI/PMH: HPI: Pt is a 47 yo male presenting with severe alcohol withdrawal and visual hallucinations. PMH includes chronic alcohol abuse, tobacco abuse, schizophrenia, HTN, tardive dyskinesia,  and chronic anemia. Clinical Impression: Clinical Impression: Images not available from radiology for complete review of swallow study; however, overall impressions from during the study include a primarily oral dysphagia. He has disorganized bolus control and incomplete oral clearance leading to generalized oral residue. A lot of the liquid residue in his mouth was expectorated during moments of coughing. Pt had penetration fairly consistently with thin liquids, seemingly from impaired timing, with limited view of the true vocal folds to be able to rule out aspiration. Barium was noted to at least be sitting on his vocal folds without attempts at clearance. When combined with frequent coughing at bedside with thin liquid consumption, there is still concern for intermittent aspiration. Pt had better airway protection with other consistencies tested (nectar and honey thick liquids, purees, solids). Recommend starting with mechanical soft diet and nectar thick liquids with assistance during meal especially to cue for management of oral residue and  overall pacing. Factors that may increase risk of adverse event in presence of aspiration ([place] 2021): Factors that may increase risk of adverse event in presence of aspiration ([place] 2021): Reduced cognitive function; Frail or deconditioned; Inadequate oral hygiene Recommendations/Plan: Swallowing Evaluation Recommendations Swallowing Evaluation Recommendations Recommendations: PO diet PO Diet Recommendation: Dysphagia 3 (Mechanical soft); Mildly thick liquids (Level 2, nectar thick) Liquid Administration via: Cup; Straw Medication Administration: Crushed with puree Supervision: Staff to assist with self-feeding; Full supervision/cueing for swallowing strategies Swallowing strategies  : Minimize environmental distractions; Slow rate; Small bites/sips; Check for pocketing or oral holding Postural changes: Position pt fully upright for meals; Stay upright 30-60 min after meals Oral care recommendations: Oral care BID (2x/day) Caregiver Recommendations: Have oral suction available Treatment Plan Treatment Plan Treatment recommendations: Therapy as outlined in treatment plan below Follow-up recommendations: Home health SLP Functional status assessment: Patient has had a recent decline in their functional status and demonstrates the ability to make significant improvements in function in a reasonable and predictable amount of time. Treatment frequency: Min 2x/week Treatment duration: 2 weeks Interventions: Aspiration precaution training; Compensatory techniques; Patient/family education; Trials of upgraded texture/liquids; Diet toleration management by SLP Recommendations Recommendations for follow up therapy are one component of a multi-disciplinary discharge planning process, led by the attending physician.  Recommendations may be updated based on patient status, additional functional criteria and insurance authorization. Assessment: Orofacial Exam: Orofacial Exam Oral Cavity: Oral Hygiene:  Pooled secretions; Lingual coating Oral Cavity - Dentition: Poor condition; Missing dentition Orofacial Anatomy: WFL Anatomy: No data recorded Thin Liquids: No data recorded Mildly Thick Liquids: No data recorded Moderately Thick Liquids: No data recorded Puree: No data recorded Solid: No data recorded Pill: No data recorded Compensatory Strategies: No data recorded  General Information: Caregiver present: No  Diet Prior to this Study: Cortrak/Small bore NG tube; NPO   Temperature : Normal   Respiratory Status: WFL   Supplemental O2: None (Room air)   History of Recent Intubation: No  Behavior/Cognition: Alert; Cooperative; Requires cueing Self-Feeding Abilities: Needs assist with self-feeding Baseline vocal quality/speech: Abnormal resonance Volitional Cough: Able to elicit Volitional Swallow: Able to elicit No data recorded Goal Planning: Prognosis for improved oropharyngeal function: Good Barriers to Reach Goals: Cognitive deficits No data recorded Patient/Family Stated Goal: to eat and drink Consulted and agree with results and recommendations: Patient; Nurse Pain: Pain Assessment Pain Assessment: Faces Faces Pain Scale: 0 Facial Expression: 0 Body Movements: 0 Muscle Tension: 0 Compliance with ventilator (intubated pts.): N/A Vocalization (extubated pts.): N/A CPOT Total: 0  Pain Location: abdominal discomfort, reports "I'm starving" reports abdominal discomfort improved s/p BM Pain Descriptors / Indicators: Discomfort End of Session: Start Time:SLP Start Time (ACUTE ONLY): 1341 Stop Time: SLP Stop Time (ACUTE ONLY): 1401 Time Calculation:SLP Time Calculation (min) (ACUTE ONLY): 20 min Charges: SLP Evaluations $ SLP Speech Visit: 1 Visit SLP Evaluations $BSS Swallow: 1 Procedure $MBS Swallow: 1 Procedure $Swallowing Treatment: 1 Procedure SLP visit diagnosis: SLP Visit Diagnosis: Dysphagia, oropharyngeal phase (R13.12) Past Medical History: Past Medical History: Diagnosis Date  Drug abuse ([place])   Hypertension    Schizophrenia ([place])   Tardive dyskinesia  Past Surgical History: No past surgical history on file. Osie Bond., M.A. [facility] [phone] Secure chat preferred 01/30/2023, 4:26 PM  DG Abd Portable 1V  Result Date: 01/29/2023 CLINICAL DATA:  Encounter for feeding tube placement EXAM: PORTABLE ABDOMEN - 1 VIEW COMPARISON:  Portable exam 1128 hours compared to 04/21/2004 FINDINGS: Tip of feeding tube projects over duodenal bulb. Visualized bowel gas pattern normal. Scattered endplate spur formation thoracic spine. IMPRESSION: Tip of feeding tube projects over duodenal bulb. Electronically Signed   By: Lavonia Dana M.D.   On: 01/29/2023 11:59   DG Chest 2 View  Result Date: 01/23/2023 CLINICAL DATA:  Withdrawal. EXAM: CHEST - 2 VIEW COMPARISON:  Chest radiograph dated 11/15/2022. FINDINGS: The heart size and mediastinal contours are within normal limits. Both lungs are clear. The visualized skeletal structures are unremarkable. IMPRESSION: No active cardiopulmonary disease. Electronically Signed   By: Anner Crete M.D.   On: 01/23/2023 19:12     Discharge Exam: Vitals:   02/01/23 0826 02/01/23 0904  BP: 135/82 127/81  Pulse: 82 90  Resp: 18 18  Temp: (!) 97.5 F (36.4 C) 98.2 F (36.8 C)  SpO2: 99% 100%   Vitals:   01/31/23 2008 02/01/23 0442 02/01/23 0826 02/01/23 0904  BP: (!) 140/92 (!) 140/93 135/82 127/81  Pulse: 94 70 82 90  Resp: '18 18 18 18  '$ Temp: 98.2 F (36.8 C) 97.9 F (36.6 C) (!) 97.5 F (36.4 C) 98.2 F (36.8 C)  TempSrc: Oral Oral Oral   SpO2: 99% 99% 99% 100%  Weight:  96.6 kg    Height:        General: Pt is alert, awake, not in acute distress Cardiovascular: RRR, S1/S2 +, no rubs, no gallops Respiratory: CTA bilaterally, no wheezing, no rhonchi Abdominal: Soft, NT, ND, bowel sounds + Extremities: no edema, no cyanosis    The results of significant diagnostics from this hospitalization (including imaging, microbiology,  ancillary and laboratory) are listed below for reference.     Microbiology: Recent Results (from the past 240 hour(s))  Resp panel by RT-PCR (RSV, Flu A&B, Covid) Anterior Nasal Swab     Status: None   Collection Time: 01/23/23  9:01 PM   Specimen: Anterior Nasal Swab  Result Value Ref Range Status   SARS Coronavirus 2 by RT PCR NEGATIVE NEGATIVE Final   Influenza A by PCR NEGATIVE NEGATIVE Final   Influenza B by PCR NEGATIVE NEGATIVE Final    Comment: (NOTE) The Xpert Xpress SARS-CoV-2/FLU/RSV plus assay is intended as an aid in the diagnosis of influenza from Nasopharyngeal swab specimens and should not be used as a sole basis for treatment. Nasal washings and aspirates are unacceptable for Xpert Xpress SARS-CoV-2/FLU/RSV testing.  Fact Sheet for Patients: EntrepreneurPulse.com.au  Fact Sheet for Healthcare Providers: IncredibleEmployment.be  This test is not yet approved or cleared by the [place] and  has been authorized for detection and/or diagnosis of SARS-CoV-2 by FDA under an Emergency Use Authorization (EUA). This EUA will remain in effect (meaning this test can be used) for the duration of the COVID-19 declaration under Section 564(b)(1) of the Act, 21 U.S.C. section 360bbb-3(b)(1), unless the authorization is terminated or revoked.     Resp Syncytial Virus by PCR NEGATIVE NEGATIVE Final    Comment: (NOTE) Fact Sheet for Patients: EntrepreneurPulse.com.au  Fact Sheet for Healthcare Providers: IncredibleEmployment.be  This test is not yet approved or cleared by the [place] FDA and has been authorized for detection and/or diagnosis of SARS-CoV-2 by FDA under an Emergency Use Authorization (EUA). This EUA will remain in effect (meaning this test can be used) for the duration of the COVID-19 declaration under Section 564(b)(1) of the Act, 21 U.S.C. section 360bbb-3(b)(1), unless  the authorization is terminated or revoked.  Performed at [facility], [place] [address], [place], [place] [address]   MRSA Next Gen by PCR, Nasal     Status: None   Collection Time: 01/24/23  1:45 AM   Specimen: Nasal Mucosa; Nasal Swab  Result Value Ref Range Status   MRSA by PCR Next Gen NOT DETECTED NOT DETECTED Final    Comment: (NOTE) The GeneXpert MRSA Assay (FDA approved for NASAL specimens only), is one component of a comprehensive MRSA colonization surveillance program. It is not intended to diagnose MRSA infection nor to guide or monitor treatment for MRSA infections. Test performance is not FDA approved in patients less than 32 years old. Performed at [facility], [place] [address], [place], [place] [address]   MRSA Next Gen by PCR, Nasal     Status: None   Collection Time: 01/28/23  2:51 AM   Specimen: Nasal Mucosa; Nasal Swab  Result Value Ref Range Status   MRSA by PCR Next Gen NOT DETECTED NOT DETECTED Final    Comment: (NOTE) The GeneXpert MRSA Assay (FDA approved for NASAL specimens only), is one component of a comprehensive MRSA colonization surveillance program. It is not intended to diagnose MRSA infection nor to guide or monitor treatment for MRSA infections. Test performance is not FDA approved in patients less than 16 years old. Performed at [facility], [place] [address], [place],  [address]      Labs: BNP (last 3 results) No results for input(s): "BNP" in the last 8760 hours. Basic Metabolic Panel: Recent Labs  Lab 01/26/23 0137 01/27/23 0025 01/28/23 0250 01/29/23 0039 01/30/23 0553 01/31/23 0800  NA 136 135 135 135 134* 135  K 3.8 3.7 3.8 4.2 4.0 5.0  CL 101 101 101 103 97* 100  CO2 26 24 21* '23 25 26  '$ GLUCOSE 112* 114* 104* 104* 101* 134*  BUN '10 6 11 19 15 15  '$ CREATININE 0.74 0.76 0.71 0.84 0.76 0.95  CALCIUM 8.4* 8.9 8.8* 8.6* 8.5* 8.9  MG 2.1 2.1 2.1  --  2.1 2.2  PHOS  --   --   --   --  3.9 3.7   Liver  Function Tests: No results for input(s): "AST", "ALT", "ALKPHOS", "BILITOT", "PROT", "ALBUMIN" in the last 168 hours. No results for input(s): "LIPASE", "AMYLASE" in the last 168 hours. No results for input(s): "AMMONIA" in the last 168 hours. CBC: Recent Labs  Lab 01/26/23 0137 01/28/23 0250 01/29/23 0039 01/30/23 0553  WBC 6.7 11.3* 7.5 6.0  HGB 10.3* 12.7* 12.1* 13.0  HCT 31.8* 39.7 37.7* 39.0  MCV 84.4  83.6 83.4 82.6  PLT 204 282 262 302   Cardiac Enzymes: No results for input(s): "CKTOTAL", "CKMB", "CKMBINDEX", "TROPONINI" in the last 168 hours. BNP: Invalid input(s): "POCBNP" CBG: Recent Labs  Lab 01/31/23 0758 01/31/23 1149 01/31/23 1622 01/31/23 2000 02/01/23 0439  GLUCAP 137* 96 112* 118* 97   D-Dimer No results for input(s): "DDIMER" in the last 72 hours. Hgb A1c No results for input(s): "HGBA1C" in the last 72 hours. Lipid Profile No results for input(s): "CHOL", "HDL", "LDLCALC", "TRIG", "CHOLHDL", "LDLDIRECT" in the last 72 hours. Thyroid function studies No results for input(s): "TSH", "T4TOTAL", "T3FREE", "THYROIDAB" in the last 72 hours.  Invalid input(s): "FREET3" Anemia work up No results for input(s): "VITAMINB12", "FOLATE", "FERRITIN", "TIBC", "IRON", "RETICCTPCT" in the last 72 hours. Urinalysis    Component Value Date/Time   COLORURINE AMBER (A) 01/24/2023 2213   APPEARANCEUR CLEAR 01/24/2023 2213   LABSPEC 1.029 01/24/2023 2213   PHURINE 5.0 01/24/2023 2213   GLUCOSEU NEGATIVE 01/24/2023 2213   HGBUR NEGATIVE 01/24/2023 2213   BILIRUBINUR NEGATIVE 01/24/2023 2213   KETONESUR 5 (A) 01/24/2023 2213   PROTEINUR NEGATIVE 01/24/2023 2213   NITRITE NEGATIVE 01/24/2023 2213   LEUKOCYTESUR NEGATIVE 01/24/2023 2213   Sepsis Labs Recent Labs  Lab 01/26/23 0137 01/28/23 0250 01/29/23 0039 01/30/23 0553  WBC 6.7 11.3* 7.5 6.0   Microbiology Recent Results (from the past 240 hour(s))  Resp panel by RT-PCR (RSV, Flu A&B, Covid) Anterior  Nasal Swab     Status: None   Collection Time: 01/23/23  9:01 PM   Specimen: Anterior Nasal Swab  Result Value Ref Range Status   SARS Coronavirus 2 by RT PCR NEGATIVE NEGATIVE Final   Influenza A by PCR NEGATIVE NEGATIVE Final   Influenza B by PCR NEGATIVE NEGATIVE Final    Comment: (NOTE) The Xpert Xpress SARS-CoV-2/FLU/RSV plus assay is intended as an aid in the diagnosis of influenza from Nasopharyngeal swab specimens and should not be used as a sole basis for treatment. Nasal washings and aspirates are unacceptable for Xpert Xpress SARS-CoV-2/FLU/RSV testing.  Fact Sheet for Patients: EntrepreneurPulse.com.au  Fact Sheet for Healthcare Providers: IncredibleEmployment.be  This test is not yet approved or cleared by the [place] FDA and has been authorized for detection and/or diagnosis of SARS-CoV-2 by FDA under an Emergency Use Authorization (EUA). This EUA will remain in effect (meaning this test can be used) for the duration of the COVID-19 declaration under Section 564(b)(1) of the Act, 21 U.S.C. section 360bbb-3(b)(1), unless the authorization is terminated or revoked.     Resp Syncytial Virus by PCR NEGATIVE NEGATIVE Final    Comment: (NOTE) Fact Sheet for Patients: EntrepreneurPulse.com.au  Fact Sheet for Healthcare Providers: IncredibleEmployment.be  This test is not yet approved or cleared by the [place] FDA and has been authorized for detection and/or diagnosis of SARS-CoV-2 by FDA under an Emergency Use Authorization (EUA). This EUA will remain in effect (meaning this test can be used) for the duration of the COVID-19 declaration under Section 564(b)(1) of the Act, 21 U.S.C. section 360bbb-3(b)(1), unless the authorization is terminated or revoked.  Performed at [facility], [place] [address] [place], [place] [address]   MRSA Next Gen by PCR, Nasal     Status: None    Collection Time: 01/24/23  1:45 AM   Specimen: Nasal Mucosa; Nasal Swab  Result Value Ref Range Status   MRSA by PCR Next Gen NOT DETECTED NOT DETECTED Final    Comment: (NOTE) The  GeneXpert MRSA Assay (FDA approved for NASAL specimens only), is one component of a comprehensive MRSA colonization surveillance program. It is not intended to diagnose MRSA infection nor to guide or monitor treatment for MRSA infections. Test performance is not FDA approved in patients less than 13 years old. Performed at [facility], [place] [address] [place], [place] [address]   MRSA Next Gen by PCR, Nasal     Status: None   Collection Time: 01/28/23  2:51 AM   Specimen: Nasal Mucosa; Nasal Swab  Result Value Ref Range Status   MRSA by PCR Next Gen NOT DETECTED NOT DETECTED Final    Comment: (NOTE) The GeneXpert MRSA Assay (FDA approved for NASAL specimens only), is one component of a comprehensive MRSA colonization surveillance program. It is not intended to diagnose MRSA infection nor to guide or monitor treatment for MRSA infections. Test performance is not FDA approved in patients less than 45 years old. Performed at [facility], [place] [address], [place], [place] [address]      Time coordinating discharge: Over 30 minutes  SIGNED:   Darliss Cheney, MD  [facility] 02/01/2023, 9:15 AM *Please note that this is a verbal dictation therefore any spelling or grammatical errors are due to the "[place] One" system interpretation. If 7PM-7AM, please contact night-coverage [email or web address]

## 2023-02-01 NOTE — TOC Transition Note (Signed)
Transition of Care [facility]) - CM/SW Discharge Note   Patient Details  Name: Cole Henderson MRN: [id number] Date of Birth: 1976/11/28  Transition [facility]) CM/SW Contact:  Tom-Johnson, Renea Ee, RN Phone Number: 02/01/2023, 10:02 AM   Clinical Narrative:     Patient is scheduled for discharge today. Patient declined [place] and outpatient PT d/t having to pay co-pay and stating he does not need PT.  New patient establishment appointment schedule and Discharge instructions on AVS.  Family to transport at discharge. No further TOC needs noted.          Final next level of care: Home/Self Care Barriers to Discharge: Barriers Resolved   Patient Goals and CMS Choice CMS Medicare.gov Compare Post Acute Care list provided to:: Patient Choice offered to / list presented to : Patient  Discharge Placement                  Patient to be transferred to facility by: Family      Discharge Plan and Services Additional resources added to the After Visit Summary for   In-house Referral: Clinical Social Work Discharge Planning Services: CM Consult Post Acute Care Choice:  (Declined)          DME Arranged: N/A DME Agency: NA       HH Arranged: Refused HH [place] Agency: NA        Social Determinants of Health ([place]) Interventions [place]: No Food Insecurity (09/04/2022)  Housing: Low Risk  (09/04/2022)  Transportation Needs: No Transportation Needs (09/04/2022)  Utilities: Not At Risk (09/04/2022)  Alcohol Screen: Low Risk  (09/04/2022)  Depression (PHQ2-9): Medium Risk (12/01/2020)  Tobacco Use: High Risk (01/23/2023)     Readmission Risk Interventions     No data to display

## 2023-02-01 NOTE — Progress Notes (Signed)
DISCHARGE NOTE HOME Cole Henderson to be discharged Home per MD order. Discussed prescriptions and follow up appointments with the patient. Prescriptions given to patient; medication list explained in detail. Patient verbalized understanding.  Skin clean, dry and intact without evidence of skin break down, no evidence of skin tears noted. IV catheter discontinued intact. Site without signs and symptoms of complications. Dressing and pressure applied. Pt denies pain at the site currently. No complaints noted.  Patient free of lines, drains, and wounds.   An After Visit Summary (AVS) was printed and given to the patient. Patient escorted via wheelchair to discharge lounge, and discharged home via private auto.  Hassell Halim, LPN

## 2023-02-06 DIAGNOSIS — I700 Atherosclerosis of aorta: Secondary | ICD-10-CM | POA: Diagnosis not present

## 2023-02-06 DIAGNOSIS — R109 Unspecified abdominal pain: Secondary | ICD-10-CM | POA: Diagnosis not present

## 2023-02-06 DIAGNOSIS — R140 Abdominal distension (gaseous): Secondary | ICD-10-CM | POA: Diagnosis not present

## 2023-02-06 DIAGNOSIS — R339 Retention of urine, unspecified: Secondary | ICD-10-CM | POA: Diagnosis not present

## 2023-02-06 DIAGNOSIS — R39198 Other difficulties with micturition: Secondary | ICD-10-CM | POA: Diagnosis not present

## 2023-02-12 ENCOUNTER — Emergency Department (HOSPITAL_COMMUNITY)
Admission: EM | Admit: 2023-02-12 | Discharge: 2023-02-12 | Disposition: A | Discharge status: Home / Self Care | Payer: 59 | Attending: Emergency Medicine | Admitting: Emergency Medicine

## 2023-02-12 ENCOUNTER — Other Ambulatory Visit: Payer: Self-pay

## 2023-02-12 DIAGNOSIS — F1020 Alcohol dependence, uncomplicated: Secondary | ICD-10-CM | POA: Diagnosis not present

## 2023-02-12 DIAGNOSIS — F419 Anxiety disorder, unspecified: Secondary | ICD-10-CM | POA: Diagnosis not present

## 2023-02-12 DIAGNOSIS — F1510 Other stimulant abuse, uncomplicated: Secondary | ICD-10-CM

## 2023-02-12 DIAGNOSIS — Z760 Encounter for issue of repeat prescription: Secondary | ICD-10-CM | POA: Diagnosis not present

## 2023-02-12 DIAGNOSIS — Z79899 Other long term (current) drug therapy: Secondary | ICD-10-CM | POA: Diagnosis not present

## 2023-02-12 DIAGNOSIS — I10 Essential (primary) hypertension: Secondary | ICD-10-CM | POA: Insufficient documentation

## 2023-02-12 MED ORDER — CHLORDIAZEPOXIDE HCL 25 MG PO CAPS: ORAL_CAPSULE | ORAL | 0 refills | Status: DC

## 2023-02-12 MED ORDER — LORAZEPAM 1 MG PO TABS
1.0000 mg | ORAL_TABLET | Freq: Once | ORAL | Status: AC
  Administered 2023-02-12: 1 mg via ORAL
  Filled 2023-02-12: qty 1

## 2023-02-12 MED ORDER — FLUPHENAZINE HCL 10 MG PO TABS: 10.0000 mg | ORAL_TABLET | Freq: Two times a day (BID) | ORAL | 0 refills | Status: DC

## 2023-02-12 NOTE — ED Triage Notes (Signed)
Pt arrived via POV. Pt states they have been out of their psych meds for a week, and having been smoking and injecting meth. Pt is heavy ETOH user and states last drink was 3x days ago. Pt has no SI/HI.  Appears very fidgety, and mildly anxious.  AOx4

## 2023-02-12 NOTE — Discharge Instructions (Signed)
It was our pleasure to provide your ER care today - we hope that you feel better.  Avoid drug and alcohol use as it is harmful to your physical health and mental well-being. See resource guide attached in terms of accessing inpatient or outpatient substance use treatment programs.   Follow up closely with primary care doctor and behavioral health provider in the coming week. Also follow up regarding your blood pressure that is high today.   For mental health issues and/or crisis, you may also go directly to the [facility] - they are open 24/7 and walk-ins are welcome.    Take your meds as prescribed by your doctors.  Follow up closely with your doctor should you need further refill of medications. You may take librium as need for withdrawal symptoms - no driving when taking.   Return to ER if worse, new symptoms, fevers, chest pain, trouble breathing, or other emergency concern.

## 2023-02-12 NOTE — ED Provider Notes (Signed)
[place] EMERGENCY DEPARTMENT AT [facility] Provider Note   CSN: [id number] Arrival date & time: 02/12/23  1022     History  Chief Complaint  Patient presents with   Mental Health Problem    Cole Henderson is a 47 y.o. male.  Patient indicates he has been using a lot of methamphetamine lately as was feeling anxious, states is not starting 'to come down' from that so is feeling a bit better. Indicates out of his prolixin this week, and is requesting rx until he can see his doctor. Continues to drink alcohol.  Pt does not express interest in stopping etoh/drug use. Denies nausea or vomiting. No abd pain. No chest pain or sob. No headache. Denies depression or thoughts of self harm.   The history is provided by the patient and medical records.  Mental Health Problem Associated symptoms: anxiety   Associated symptoms: no abdominal pain, no chest pain and no headaches        Home Medications Prior to Admission medications   Medication Sig Start Date End Date Taking? Authorizing Provider  amLODipine (NORVASC) 5 MG tablet Take 1 tablet (5 mg total) by mouth daily. 02/01/23 03/03/23  Darliss Cheney, MD  fluPHENAZine (PROLIXIN) 10 MG tablet Take 1 tablet (10 mg total) by mouth 2 (two) times daily. 09/10/22   Dian Situ, MD  gabapentin (NEURONTIN) 100 MG capsule Take 2 capsules (200 mg total) by mouth 3 (three) times daily. 09/10/22   Dian Situ, MD  hydrOXYzine (ATARAX) 25 MG tablet Take 1 tablet (25 mg total) by mouth every 6 (six) hours as needed for anxiety. 09/10/22   Dian Situ, MD  nicotine (NICODERM CQ - DOSED IN MG/24 HOURS) 21 mg/24hr patch Place 1 patch (21 mg total) onto the skin daily. 09/11/22   Dian Situ, MD  sertraline (ZOLOFT) 100 MG tablet Take 1 tablet (100 mg total) by mouth daily. 09/11/22   Dian Situ, MD  traZODone (DESYREL) 50 MG tablet Take 1 tablet (50 mg total) by mouth at bedtime as needed for sleep. 09/10/22   Dian Situ, MD   trihexyphenidyl (ARTANE) 5 MG tablet Take 1 tablet (5 mg total) by mouth 3 (three) times daily. Patient taking differently: Take 5 mg by mouth in the morning and at bedtime. 09/10/22   Dian Situ, MD      Allergies    Benztropine mesylate, Fish allergy, Haloperidol, Haloperidol lactate, Shellfish allergy, Cephalexin, Cogentin [benztropine], Ingrezza [valbenazine tosylate], Risperidone, Risperidone and related, Strawberry extract, and Geodon [ziprasidone]    Review of Systems   Review of Systems  Constitutional:  Negative for chills and fever.  HENT:  Negative for sore throat.   Eyes:  Negative for visual disturbance.  Respiratory:  Negative for cough and shortness of breath.   Cardiovascular:  Negative for chest pain.  Gastrointestinal:  Negative for abdominal pain, diarrhea and vomiting.  Genitourinary:  Negative for dysuria and flank pain.  Musculoskeletal:  Negative for back pain and neck pain.  Skin:  Negative for rash.  Neurological:  Negative for headaches.  Hematological:  Does not bruise/bleed easily.  Psychiatric/Behavioral:  Negative for confusion. The patient is nervous/anxious.     Physical Exam Updated Vital Signs BP (!) 170/101 (BP Location: Right Arm)   Pulse 91   Temp 98.2 F (36.8 C) (Oral)   Resp (!) 22   SpO2 99%  Physical Exam Vitals and nursing note reviewed.  Constitutional:      Appearance: Normal appearance. He is well-developed.  HENT:     Head: Atraumatic.     Nose: Nose normal.     Mouth/Throat:     Mouth: Mucous membranes are moist.  Eyes:     General: No scleral icterus.    Conjunctiva/sclera: Conjunctivae normal.     Pupils: Pupils are equal, round, and reactive to light.  Neck:     Trachea: No tracheal deviation.  Cardiovascular:     Rate and Rhythm: Normal rate and regular rhythm.     Pulses: Normal pulses.     Heart sounds: Normal heart sounds. No murmur heard.    No friction rub. No gallop.  Pulmonary:     Effort: Pulmonary  effort is normal. No accessory muscle usage or respiratory distress.     Breath sounds: Normal breath sounds.  Abdominal:     General: There is no distension.     Palpations: Abdomen is soft.     Tenderness: There is no abdominal tenderness.  Genitourinary:    Comments: No cva tenderness. Musculoskeletal:        General: No swelling.     Cervical back: Normal range of motion and neck supple. No rigidity.  Skin:    General: Skin is warm and dry.     Findings: No rash.  Neurological:     Mental Status: He is alert.     Comments: Alert, speech clear. Motor/sens grossly intact bil. No tremor or shakes noted.   Psychiatric:     Comments: Mildly anxious appearing. Denies feeling depressed or thoughts of self harm. Pt does not appear to be responding to internal stimuli - no delusions, hallucinations or acute psychosis noted.      ED Results / Procedures / Treatments   Labs (all labs ordered are listed, but only abnormal results are displayed)   EKG None  Radiology No results found.  Procedures Procedures    Medications Ordered in ED Medications  LORazepam (ATIVAN) tablet 1 mg (1 mg Oral Given 02/12/23 1135)    ED Course/ Medical Decision Making/ A&P                             Medical Decision Making Problems Addressed: Anxiety: acute illness or injury    Details: Acute on chronic Chronic alcoholism ([place]): chronic illness or injury with exacerbation, progression, or side effects of treatment that poses a threat to life or bodily functions Essential hypertension: chronic illness or injury with exacerbation, progression, or side effects of treatment that poses a threat to life or bodily functions Medication refill: acute illness or injury 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 Labs: ordered. Decision-making details documented in ED Course.  Risk Prescription drug management.   Reviewed nursing notes and prior charts for additional history. Recent hospital stay. Most recent labs reviewed by me - chem normal, wbc/ct normal.   Ativan po. Po fluids.   Pt requests rx prolixin - provided.  Pt is encourage to pursue sobriety, and f/u closely as outpatient with pcp and bh f/u - resource guide provided.   Pt currently appears stable for d/c.  Return precautions provided.            Final Clinical Impression(s) / ED Diagnoses Final diagnoses:  None    Rx / DC Orders ED Discharge Orders  None         Lajean Saver, MD 02/12/23 1212

## 2023-02-14 ENCOUNTER — Ambulatory Visit: Payer: 59 | Admitting: Student

## 2023-02-21 ENCOUNTER — Ambulatory Visit (HOSPITAL_COMMUNITY)
Admission: EM | Admit: 2023-02-21 | Discharge: 2023-02-23 | Disposition: A | Discharge status: Other Acute Inpatient Hospital | Payer: 59 | Attending: Family | Admitting: Family

## 2023-02-21 DIAGNOSIS — F209 Schizophrenia, unspecified: Secondary | ICD-10-CM | POA: Insufficient documentation

## 2023-02-21 DIAGNOSIS — Z789 Other specified health status: Secondary | ICD-10-CM

## 2023-02-21 DIAGNOSIS — F1510 Other stimulant abuse, uncomplicated: Secondary | ICD-10-CM | POA: Insufficient documentation

## 2023-02-21 DIAGNOSIS — Z1152 Encounter for screening for COVID-19: Secondary | ICD-10-CM | POA: Insufficient documentation

## 2023-02-21 LAB — CBC WITH DIFFERENTIAL/PLATELET
Abs Immature Granulocytes: 0.02 10*3/uL (ref 0.00–0.07)
Basophils Absolute: 0.1 10*3/uL (ref 0.0–0.1)
Basophils Relative: 1 %
Eosinophils Absolute: 0.4 10*3/uL (ref 0.0–0.5)
Eosinophils Relative: 6 %
HCT: 39.1 % (ref 39.0–52.0)
Hemoglobin: 12.8 g/dL — ABNORMAL LOW (ref 13.0–17.0)
Immature Granulocytes: 0 %
Lymphocytes Relative: 24 %
Lymphs Abs: 1.9 10*3/uL (ref 0.7–4.0)
MCH: 26.9 pg (ref 26.0–34.0)
MCHC: 32.7 g/dL (ref 30.0–36.0)
MCV: 82.3 fL (ref 80.0–100.0)
Monocytes Absolute: 0.6 10*3/uL (ref 0.1–1.0)
Monocytes Relative: 8 %
Neutro Abs: 4.7 10*3/uL (ref 1.7–7.7)
Neutrophils Relative %: 61 %
Platelets: 269 10*3/uL (ref 150–400)
RBC: 4.75 MIL/uL (ref 4.22–5.81)
RDW: 15.4 % (ref 11.5–15.5)
WBC: 7.8 10*3/uL (ref 4.0–10.5)
nRBC: 0 % (ref 0.0–0.2)

## 2023-02-21 LAB — COMPREHENSIVE METABOLIC PANEL
ALT: 15 U/L (ref 0–44)
AST: 19 U/L (ref 15–41)
Albumin: 4.1 g/dL (ref 3.5–5.0)
Alkaline Phosphatase: 56 U/L (ref 38–126)
Anion gap: 13 (ref 5–15)
BUN: 17 mg/dL (ref 6–20)
CO2: 25 mmol/L (ref 22–32)
Calcium: 9.4 mg/dL (ref 8.9–10.3)
Chloride: 99 mmol/L (ref 98–111)
Creatinine, Ser: 0.85 mg/dL (ref 0.61–1.24)
GFR, Estimated: >60 mL/min (ref >60)
Glucose, Bld: 102 mg/dL — ABNORMAL HIGH (ref 70–99)
Potassium: 4.1 mmol/L (ref 3.5–5.1)
Sodium: 137 mmol/L (ref 135–145)
Total Bilirubin: 0.5 mg/dL (ref 0.3–1.2)
Total Protein: 6.7 g/dL (ref 6.5–8.1)

## 2023-02-21 LAB — POCT URINE DRUG SCREEN - MANUAL ENTRY (I-SCREEN)
POC Amphetamine UR: POSITIVE — AB
POC Buprenorphine (BUP): NOT DETECTED
POC Cocaine UR: POSITIVE — AB
POC Marijuana UR: POSITIVE — AB
POC Methadone UR: NOT DETECTED
POC Methamphetamine UR: POSITIVE — AB
POC Morphine: NOT DETECTED
POC Oxazepam (BZO): NOT DETECTED
POC Oxycodone UR: NOT DETECTED
POC Secobarbital (BAR): POSITIVE — AB

## 2023-02-21 LAB — ETHANOL: Alcohol, Ethyl (B): <10 mg/dL (ref <10)

## 2023-02-21 LAB — MAGNESIUM: Magnesium: 2.1 mg/dL (ref 1.7–2.4)

## 2023-02-21 LAB — RESP PANEL BY RT-PCR (RSV, FLU A&B, COVID)  RVPGX2
Influenza A by PCR: NEGATIVE
Influenza B by PCR: NEGATIVE
Resp Syncytial Virus by PCR: NEGATIVE
SARS Coronavirus 2 by RT PCR: NEGATIVE

## 2023-02-21 LAB — TSH: TSH: 1.487 u[IU]/mL (ref 0.350–4.500)

## 2023-02-21 MED ORDER — LORAZEPAM 1 MG PO TABS
1.0000 mg | ORAL_TABLET | Freq: Four times a day (QID) | ORAL | Status: AC
  Administered 2023-02-21 – 2023-02-22 (×6): 1 mg via ORAL
  Filled 2023-02-21 (×2): qty 2
  Filled 2023-02-21 (×4): qty 1

## 2023-02-21 MED ORDER — ADULT MULTIVITAMIN W/MINERALS CH
1.0000 | ORAL_TABLET | Freq: Every day | ORAL | Status: DC
  Administered 2023-02-21 – 2023-02-23 (×3): 1 via ORAL
  Filled 2023-02-21 (×3): qty 1

## 2023-02-21 MED ORDER — LORAZEPAM 1 MG PO TABS
1.0000 mg | ORAL_TABLET | Freq: Four times a day (QID) | ORAL | Status: DC | PRN
  Administered 2023-02-21: 1 mg via ORAL
  Filled 2023-02-21: qty 1

## 2023-02-21 MED ORDER — LORAZEPAM 1 MG PO TABS: 1.0000 mg | ORAL_TABLET | Freq: Every day | ORAL | Status: DC

## 2023-02-21 MED ORDER — ONDANSETRON 4 MG PO TBDP: 4.0000 mg | ORAL_TABLET | Freq: Four times a day (QID) | ORAL | Status: DC | PRN

## 2023-02-21 MED ORDER — NICOTINE 21 MG/24HR TD PT24
21.0000 mg | MEDICATED_PATCH | Freq: Every day | TRANSDERMAL | Status: DC
  Administered 2023-02-21 – 2023-02-22 (×2): 21 mg via TRANSDERMAL
  Filled 2023-02-21 (×3): qty 1

## 2023-02-21 MED ORDER — GABAPENTIN 100 MG PO CAPS
200.0000 mg | ORAL_CAPSULE | Freq: Three times a day (TID) | ORAL | Status: DC
  Administered 2023-02-21 – 2023-02-23 (×6): 200 mg via ORAL
  Filled 2023-02-21 (×6): qty 2

## 2023-02-21 MED ORDER — ALUM & MAG HYDROXIDE-SIMETH 200-200-20 MG/5ML PO SUSP: 30.0000 mL | ORAL | Status: DC | PRN

## 2023-02-21 MED ORDER — AMLODIPINE BESYLATE 5 MG PO TABS
5.0000 mg | ORAL_TABLET | Freq: Every day | ORAL | Status: DC
  Administered 2023-02-21 – 2023-02-23 (×3): 5 mg via ORAL
  Filled 2023-02-21 (×3): qty 1

## 2023-02-21 MED ORDER — SERTRALINE HCL 100 MG PO TABS
100.0000 mg | ORAL_TABLET | Freq: Every day | ORAL | Status: DC
  Administered 2023-02-21 – 2023-02-23 (×3): 100 mg via ORAL
  Filled 2023-02-21 (×3): qty 1

## 2023-02-21 MED ORDER — TRAZODONE HCL 50 MG PO TABS
50.0000 mg | ORAL_TABLET | Freq: Every evening | ORAL | Status: DC | PRN
  Administered 2023-02-21 – 2023-02-22 (×2): 50 mg via ORAL
  Filled 2023-02-21 (×3): qty 1

## 2023-02-21 MED ORDER — LORAZEPAM 1 MG PO TABS
1.0000 mg | ORAL_TABLET | Freq: Three times a day (TID) | ORAL | Status: DC
  Administered 2023-02-23: 1 mg via ORAL
  Filled 2023-02-21: qty 1

## 2023-02-21 MED ORDER — MAGNESIUM HYDROXIDE 400 MG/5ML PO SUSP: 30.0000 mL | Freq: Every day | ORAL | Status: DC | PRN

## 2023-02-21 MED ORDER — LORAZEPAM 1 MG PO TABS: 1.0000 mg | ORAL_TABLET | Freq: Two times a day (BID) | ORAL | Status: DC

## 2023-02-21 MED ORDER — THIAMINE MONONITRATE 100 MG PO TABS
100.0000 mg | ORAL_TABLET | Freq: Every day | ORAL | Status: DC
  Administered 2023-02-22 – 2023-02-23 (×2): 100 mg via ORAL
  Filled 2023-02-21 (×2): qty 1

## 2023-02-21 MED ORDER — FLUPHENAZINE HCL 5 MG PO TABS
10.0000 mg | ORAL_TABLET | Freq: Two times a day (BID) | ORAL | Status: DC
  Administered 2023-02-21 – 2023-02-23 (×4): 10 mg via ORAL
  Filled 2023-02-21 (×4): qty 2

## 2023-02-21 MED ORDER — OLANZAPINE 10 MG IM SOLR
10.0000 mg | Freq: Once | INTRAMUSCULAR | Status: AC
  Administered 2023-02-21: 10 mg via INTRAMUSCULAR
  Filled 2023-02-21: qty 10

## 2023-02-21 MED ORDER — TRIHEXYPHENIDYL HCL 5 MG PO TABS
5.0000 mg | ORAL_TABLET | Freq: Two times a day (BID) | ORAL | Status: DC
  Administered 2023-02-21 – 2023-02-23 (×4): 5 mg via ORAL
  Filled 2023-02-21 (×4): qty 1

## 2023-02-21 MED ORDER — LORAZEPAM 2 MG/ML IJ SOLN: 2.0000 mg | Freq: Once | INTRAMUSCULAR | Status: DC

## 2023-02-21 MED ORDER — THIAMINE HCL 100 MG/ML IJ SOLN
100.0000 mg | Freq: Once | INTRAMUSCULAR | Status: AC
  Administered 2023-02-21: 100 mg via INTRAMUSCULAR
  Filled 2023-02-21: qty 2

## 2023-02-21 NOTE — ED Notes (Signed)
Patient observed/assessed in bed/chair resting quietly appearing in no distress and verbalizing no complaints at this time. Will continue to monitor.  

## 2023-02-21 NOTE — ED Notes (Signed)
PT stated that he has not taken his B/P meds in five months
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
## 2023-02-21 NOTE — ED Provider Notes (Cosign Needed Addendum)
[facility] Urgent Care Continuous Assessment Admission H&P  Date: 02/21/23 Patient Name: Cole Henderson MRN: [id number] Chief Complaint: IVC  Diagnoses:  Final diagnoses:  Amphetamine abuse ([place])  Schizophrenia, unspecified type [facility])  Alcohol use    HPI: Patient presents to [facility] behavioral health via Event organiser.  Patient currently involuntarily committed, petition initiated by patient's daughter, Stephanie Touhey.  Petition reads: "Respondent has been diagnosed with schizophrenia.  He has been off of his medications since January and is not sleeping.  Respondent is hallucinating.  He believes the house is moving up and down like a roller coaster.  He believes the mom who passed away is with him and talks to him.  Respondent is hostile; he is fighting and arguing with people he believes are with him."  Patient is assessed, face-to-face,  by nurse practitioner.  He is seated in assessment area, no apparent distress.  He is alert and oriented, pleasant and cooperative during assessment.  He presents with anxious mood, congruent affect.  Markevius states "I have been up for 4 or 5 days drinking liquor and using methamphetamine."  Patient endorses auditory hallucinations, states "I am hearing things."  Denies command hallucinations. Does not understand what voices are saying. He reports ongoing auditory hallucinations since age 47.  He states "my medication is too expensive but when I have my Prolixin the voices are only whispers."   Patient reports his primary stressor is inability to afford his medications.  He has been stable on a long-acting injectable medication in the past, considering long-acting injectable medications moving forward.  Discussed plan to follow-up at [facility] behavioral health versus [place] as he does have challenges with transportation. Reviewed assistance for medications through [facility] and [facility].  Patient endorses daily alcohol use  times approximately 1 week.  Recently detoxed after an inpatient hospital stay approximately, discharged on 02/01/2023 after 8 day admission including ICU and percedex related to alcohol withdrawal.  He typically consumes up to 1/5 of liquor per day.  Last drink earlier this day.  He also uses methamphetamine chronically.  Most recent methamphetamine use earlier this date.  He uses nicotine/tobacco daily.  He denies substance use aside from alcohol, methamphetamine and nicotine.  Patient is not interested in substance use treatment at this time, states "I use (substances) to medicate myself."  Ellery's diagnoses include schizophrenia and substance use disorders.  He is followed by outpatient psychiatry at [facility], Dr. Bethann Humble.  He is typically compliant with medications including fluphenazine, Zoloft, Artane and trazodone.  Most recent dose of these medications on yesterday.  He is not linked with outpatient individual counseling. He endorses history of multiple previous inpatient psychiatric hospitalizations.  Unable to recall most recent hospitalization.  He denies family mental health or substance abuse history.  Patient denies suicidal and homicidal ideation.  He denies history of suicide attempts.  He denies history of nonsuicidal self-harm behavior.  He easily contracts verbally for safety with this Probation officer.  He denies visual hallucinations.  There is no evidence of delusional thought content and no indication the patient is responding to internal stimuli currently.  Patient currently resides outside of the home of his daughter.  He is seeking employment. Shares that barrier to employment is criminal record and current probation status. He denies access to weapons.  He is not currently employed.  He endorses average appetite.  Patient offered support and encouragement.  He gives verbal consent to speak with his daughter, Maykel Gell  phone number [phone].  Spoke with patient's daughter,  Cloyde Reams, who reports patient has recently been talking to "Clare Gandy" who has been in his head since he was 47 years old.  Patient's daughter reports patient believes his wife who died is currently in the home.  Patient's daughter shares that patient's probation officer would prefer that he be admitted to long-term residential substance use treatment.  Per patient's daughter he recently began residing outside near her home 1 week ago.  Patient's daughter reports she does not allow alcohol and substance use at her home.  She has reservations about patient returning to her home.  Reviewed treatment plan to include admission to observation area with reassessment on 02/21/2023.  Discussed medications and offered patient opportunity to ask questions.  Patient confirms he would like to remain full CODE STATUS during admission, verbalizes agreement with plan.    Total Time spent with patient: 45 minutes  Musculoskeletal  Strength & Muscle Tone: within normal limits Gait & Station: normal Patient leans: N/A  Psychiatric Specialty Exam  Presentation General Appearance:  Appropriate for Environment; Casual  Eye Contact: Good  Speech: Clear and Coherent; Normal Rate  Speech Volume: Normal  Handedness: Right   Mood and Affect  Mood: Euthymic  Affect: Congruent   Thought Process  Thought Processes: Coherent; Goal Directed; Linear  Descriptions of Associations:Intact  Orientation:Full (Time, Place and Person)  Thought Content:Logical; WDL  Diagnosis of Schizophrenia or Schizoaffective disorder in past: Yes  Duration of Psychotic Symptoms: Greater than six months  Hallucinations:Hallucinations: Auditory Description of Auditory Hallucinations: "I hear voices, they are whispers when I have my medications"  Ideas of Reference:None  Suicidal Thoughts:Suicidal Thoughts: No  Homicidal Thoughts:Homicidal Thoughts: No   Sensorium  Memory: Immediate Good; Recent  Fair  Judgment: Fair  Insight: Present   Executive Functions  Concentration: Fair  Attention Span: Fair  Recall: [employer] of Knowledge: Fair  Language: Fair   Psychomotor Activity  Psychomotor Activity: Psychomotor Activity: Increased   Assets  Assets: Communication Skills; Desire for Improvement; Physical Health; Resilience; Social Support   Sleep  Sleep: Sleep: Poor   Nutritional Assessment (For OBS and FBC admissions only) Has the patient had a weight loss or gain of 10 pounds or more in the last 3 months?: No Has the patient had a decrease in food intake/or appetite?: No Does the patient have dental problems?: No Does the patient have eating habits or behaviors that may be indicators of an eating disorder including binging or inducing vomiting?: No Has the patient recently lost weight without trying?: 0 Has the patient been eating poorly because of a decreased appetite?: 0 Malnutrition Screening Tool Score: 0    Physical Exam Vitals and nursing note reviewed.  Constitutional:      Appearance: Normal appearance. He is well-developed and normal weight.  HENT:     Head: Normocephalic and atraumatic.     Nose: Nose normal.     Mouth/Throat:     Comments: Ulcerated lesion to interior lower lip. No redness, no drainage, no irritation. Patient endorses broken tooth that "rubs on this spot every time I eat." Poor dentition noted.  Cardiovascular:     Rate and Rhythm: Normal rate.  Pulmonary:     Effort: Pulmonary effort is normal.  Musculoskeletal:     Cervical back: Normal range of motion.  Skin:    General: Skin is warm and dry.  Neurological:     Mental Status: He is alert and oriented to person, place,  and time.  Psychiatric:        Attention and Perception: Attention normal. He perceives auditory hallucinations.        Mood and Affect: Affect normal. Mood is anxious.        Speech: Speech normal.        Behavior: Behavior normal. Behavior  is cooperative.        Thought Content: Thought content normal.        Cognition and Memory: Cognition normal.    Review of Systems  Constitutional: Negative.   HENT: Negative.    Eyes: Negative.   Respiratory: Negative.    Cardiovascular: Negative.   Gastrointestinal: Negative.   Genitourinary: Negative.   Musculoskeletal: Negative.   Skin: Negative.   Neurological: Negative.   Psychiatric/Behavioral:  Positive for hallucinations and substance abuse. The patient is nervous/anxious.     Blood pressure (!) 150/104, pulse 92, temperature 97.6 F (36.4 C), temperature source Oral, resp. rate 20, SpO2 95 %. There is no height or weight on file to calculate BMI.  Past Psychiatric History: amphetamine abuse, paranoid schizophrenia, insomnia, anxiety, alcohol use with alcohol withdrawal syndrome  Is the patient at risk to self? No  Has the patient been a risk to self in the past 6 months? No .    Has the patient been a risk to self within the distant past? No   Is the patient a risk to others? No   Has the patient been a risk to others in the past 6 months? No   Has the patient been a risk to others within the distant past? No   Past Medical History: Migraine, hypertension, GERD, tardive dyskinesia, hypokalemia, hyperglycemia, hyponatremia  Family History: None reported  Social History: Chronic substance use, currently resides outside, unemployed  Last Labs:  No results displayed because visit has over 200 results.    Admission on 09/04/2022, Discharged on 09/10/2022  Component Date Value Ref Range Status   TSH 09/06/2022 1.653  0.350 - 4.500 uIU/mL Final   Comment: Performed by a 3rd Generation assay with a functional sensitivity of <=0.01 uIU/mL. Performed at [facility], [place] [address] [place], [place] [address]    Hgb A1c MFr Bld 09/06/2022 5.5  4.8 - 5.6 % Final   Comment: (NOTE) Pre diabetes:          5.7%-6.4%  Diabetes:               >6.4%  Glycemic control for   <7.0% adults with diabetes    Mean Plasma Glucose 09/06/2022 111.15  mg/dL Final   Performed at [facility], [place] [address], [place], [place] [address]   Cholesterol 09/06/2022 163  0 - 200 mg/dL Final   Triglycerides 09/06/2022 172 (H)  <150 mg/dL Final   HDL 09/06/2022 52  >40 mg/dL Final   Total CHOL/HDL Ratio 09/06/2022 3.1  RATIO Final   VLDL 09/06/2022 34  0 - 40 mg/dL Final   LDL Cholesterol 09/06/2022 77  0 - 99 mg/dL Final   Comment:        Total Cholesterol/HDL:CHD Risk Coronary Heart Disease Risk Table                     Men   Women  1/2 Average Risk   3.4   3.3  Average Risk       5.0   4.4  2 X Average Risk   9.6   7.1  3 X  Average Risk  23.4   11.0        Use the calculated Patient Ratio above and the CHD Risk Table to determine the patient's CHD Risk.        ATP III CLASSIFICATION (LDL):  <100     mg/dL   Optimal  100-129  mg/dL   Near or Above                    Optimal  130-159  mg/dL   Borderline  160-189  mg/dL   High  >190     mg/dL   Very High Performed at [place] [address], [place], [place] [address]    Vit D, 25-Hydroxy 09/06/2022 33.57  30 - 100 ng/mL Final   Comment: (NOTE) Vitamin D deficiency has been defined by the [place] practice guideline as a level of serum 25-OH  vitamin D less than 20 ng/mL (1,2). The Endocrine Society went on to  further define vitamin D insufficiency as a level between 21 and 29  ng/mL (2).  1. IOM (Institute of Medicine). 2010. Dietary reference intakes for  calcium and D. [place]: The [employer]. 2. Holick MF, Binkley [place], Bischoff-Ferrari HA, et al. Evaluation,  treatment, and prevention of vitamin D deficiency: an Endocrine  Society clinical practice guideline, JCEM. 2011 Jul; 96(7): 1911-30.  Performed at [facility], [place] [address] [place], [place] [address]    Vitamin B-12  09/06/2022 290  180 - 914 pg/mL Final   Comment: (NOTE) This assay is not validated for testing neonatal or myeloproliferative syndrome specimens for Vitamin B12 levels. Performed at [facility], [place] [address] [place], [place] [address]   Admission on 09/01/2022, Discharged on 09/04/2022  Component Date Value Ref Range Status   Sodium 09/02/2022 140  135 - 145 mmol/L Final   Potassium 09/02/2022 4.2  3.5 - 5.1 mmol/L Final   Chloride 09/02/2022 103  98 - 111 mmol/L Final   CO2 09/02/2022 28  22 - 32 mmol/L Final   Glucose, Bld 09/02/2022 131 (H)  70 - 99 mg/dL Final   Glucose reference range applies only to samples taken after fasting for at least 8 hours.   BUN 09/02/2022 10  6 - 20 mg/dL Final   Creatinine, Ser 09/02/2022 0.97  0.61 - 1.24 mg/dL Final   Calcium 09/02/2022 9.5  8.9 - 10.3 mg/dL Final   Total Protein 09/02/2022 6.9  6.5 - 8.1 g/dL Final   Albumin 09/02/2022 4.1  3.5 - 5.0 g/dL Final   AST 09/02/2022 33  15 - 41 U/L Final   ALT 09/02/2022 38  0 - 44 U/L Final   Alkaline Phosphatase 09/02/2022 46  38 - 126 U/L Final   Total Bilirubin 09/02/2022 0.6  0.3 - 1.2 mg/dL Final   GFR, Estimated 09/02/2022 >60  >60 mL/min Final   Comment: (NOTE) Calculated using the CKD-EPI Creatinine Equation (2021)    Anion gap 09/02/2022 9  5 - 15 Final   Performed at [facility], [place] [address] [place], [place] [address]   Alcohol, Ethyl (B) 09/02/2022 <10  <10 mg/dL Final   Comment: (NOTE) Lowest detectable limit for serum alcohol is 10 mg/dL.  For medical purposes only. Performed at [facility], [place] [address] [place], [place] [address]    WBC 09/02/2022 7.4  4.0 - 10.5 K/uL Final   RBC  09/02/2022 4.62  4.22 - 5.81 MIL/uL Final   Hemoglobin 09/02/2022 12.8 (L)  13.0 - 17.0 g/dL Final   HCT 09/02/2022 39.5  39.0 - 52.0 % Final   MCV 09/02/2022 85.5  80.0 - 100.0 fL Final   MCH 09/02/2022 27.7  26.0 - 34.0 pg Final   MCHC 09/02/2022 32.4   30.0 - 36.0 g/dL Final   RDW 09/02/2022 13.6  11.5 - 15.5 % Final   Platelets 09/02/2022 274  150 - 400 K/uL Final   nRBC 09/02/2022 0.0  0.0 - 0.2 % Final   Performed at [facility], [place] [address], [place], [place] [address]   SARS Coronavirus 2 by RT PCR 09/02/2022 NEGATIVE  NEGATIVE Final   Comment: (NOTE) SARS-CoV-2 target nucleic acids are NOT DETECTED.  The SARS-CoV-2 RNA is generally detectable in upper and lower respiratory specimens during the acute phase of infection. The lowest concentration of SARS-CoV-2 viral copies this assay can detect is 250 copies / mL. A negative result does not preclude SARS-CoV-2 infection and should not be used as the sole basis for treatment or other patient management decisions.  A negative result may occur with improper specimen collection / handling, submission of specimen other than nasopharyngeal swab, presence of viral mutation(s) within the areas targeted by this assay, and inadequate number of viral copies (<250 copies / mL). A negative result must be combined with clinical observations, patient history, and epidemiological information.  Fact Sheet for Patients:   https://www.patel.info/  Fact Sheet for Healthcare Providers: https://hall.com/  This test is not yet approved or                           cleared by the [place] FDA and has been authorized for detection and/or diagnosis of SARS-CoV-2 by FDA under an Emergency Use Authorization (EUA).  This EUA will remain in effect (meaning this test can be used) for the duration of the COVID-19 declaration under Section 564(b)(1) of the Act, 21 U.S.C. section 360bbb-3(b)(1), unless the authorization is terminated or revoked sooner.  Performed at [facility], [place] [address], [place], [place] [address]    SARS Coronavirus 2 by RT PCR 09/02/2022 NEGATIVE  NEGATIVE Final   Comment: (NOTE) SARS-CoV-2 target nucleic acids are  NOT DETECTED.  The SARS-CoV-2 RNA is generally detectable in upper respiratory specimens during the acute phase of infection. The lowest concentration of SARS-CoV-2 viral copies this assay can detect is 138 copies/mL. A negative result does not preclude SARS-Cov-2 infection and should not be used as the sole basis for treatment or other patient management decisions. A negative result may occur with  improper specimen collection/handling, submission of specimen other than nasopharyngeal swab, presence of viral mutation(s) within the areas targeted by this assay, and inadequate number of viral copies(<138 copies/mL). A negative result must be combined with clinical observations, patient history, and epidemiological information. The expected result is Negative.  Fact Sheet for Patients:  EntrepreneurPulse.com.au  Fact Sheet for Healthcare Providers:  IncredibleEmployment.be  This test is no                          t yet approved or cleared by the [place] FDA and  has been authorized for detection and/or diagnosis of SARS-CoV-2 by FDA under an Emergency Use Authorization (EUA). This EUA will remain  in effect (meaning this test can be used) for the duration  of the COVID-19 declaration under Section 564(b)(1) of the Act, 21 U.S.C.section 360bbb-3(b)(1), unless the authorization is terminated  or revoked sooner.       Influenza A by PCR 09/02/2022 NEGATIVE  NEGATIVE Final   Influenza B by PCR 09/02/2022 NEGATIVE  NEGATIVE Final   Comment: (NOTE) The Xpert Xpress SARS-CoV-2/FLU/RSV plus assay is intended as an aid in the diagnosis of influenza from Nasopharyngeal swab specimens and should not be used as a sole basis for treatment. Nasal washings and aspirates are unacceptable for Xpert Xpress SARS-CoV-2/FLU/RSV testing.  Fact Sheet for Patients: EntrepreneurPulse.com.au  Fact Sheet for Healthcare  Providers: IncredibleEmployment.be  This test is not yet approved or cleared by the [place] FDA and has been authorized for detection and/or diagnosis of SARS-CoV-2 by FDA under an Emergency Use Authorization (EUA). This EUA will remain in effect (meaning this test can be used) for the duration of the COVID-19 declaration under Section 564(b)(1) of the Act, 21 U.S.C. section 360bbb-3(b)(1), unless the authorization is terminated or revoked.  Performed at [facility], [place] [address], [place], [place] [address]    Opiates 09/02/2022 POSITIVE (A)  NONE DETECTED Final   Cocaine 09/02/2022 NONE DETECTED  NONE DETECTED Final   Benzodiazepines 09/02/2022 NONE DETECTED  NONE DETECTED Final   Amphetamines 09/02/2022 POSITIVE (A)  NONE DETECTED Final   Tetrahydrocannabinol 09/02/2022 NONE DETECTED  NONE DETECTED Final   Barbiturates 09/02/2022 NONE DETECTED  NONE DETECTED Final   Comment: (NOTE) DRUG SCREEN FOR MEDICAL PURPOSES ONLY.  IF CONFIRMATION IS NEEDED FOR ANY PURPOSE, NOTIFY LAB WITHIN 5 DAYS.  LOWEST DETECTABLE LIMITS FOR URINE DRUG SCREEN Drug Class                     Cutoff (ng/mL) Amphetamine and metabolites    1000 Barbiturate and metabolites    200 Benzodiazepine                 [id number] Tricyclics and metabolites     300 Opiates and metabolites        300 Cocaine and metabolites        300 THC                            50 Performed at [facility], [place] [address] [place], [place] [address]    Color, Urine 09/02/2022 YELLOW  YELLOW Final   APPearance 09/02/2022 CLEAR  CLEAR Final   Specific Gravity, Urine 09/02/2022 1.021  1.005 - 1.030 Final   pH 09/02/2022 5.0  5.0 - 8.0 Final   Glucose, UA 09/02/2022 NEGATIVE  NEGATIVE mg/dL Final   Hgb urine dipstick 09/02/2022 NEGATIVE  NEGATIVE Final   Bilirubin Urine 09/02/2022 NEGATIVE  NEGATIVE Final   Ketones, ur 09/02/2022 NEGATIVE  NEGATIVE mg/dL Final   Protein, ur 09/02/2022 NEGATIVE   NEGATIVE mg/dL Final   Nitrite 09/02/2022 NEGATIVE  NEGATIVE Final   Leukocytes,Ua 09/02/2022 NEGATIVE  NEGATIVE Final   RBC / HPF 09/02/2022 0-5  0 - 5 RBC/hpf Final   WBC, UA 09/02/2022 0-5  0 - 5 WBC/hpf Final   Bacteria, UA 09/02/2022 NONE SEEN  NONE SEEN Final   Mucus 09/02/2022 PRESENT   Final   Ca Oxalate Crys, UA 09/02/2022 PRESENT   Final   Performed at [facility], [place] [address] [place], [place] [address]   Acetaminophen (Tylenol), Serum 09/03/2022 <10 (L)  10 - 30 ug/mL Final  Comment: (NOTE) Therapeutic concentrations vary significantly. A range of 10-30 ug/mL  may be an effective concentration for many patients. However, some  are best treated at concentrations outside of this range. Acetaminophen concentrations >150 ug/mL at 4 hours after ingestion  and >50 ug/mL at 12 hours after ingestion are often associated with  toxic reactions.  Performed at [facility], [place] [address], [place], [place] [address]     Allergies: Benztropine mesylate, Fish allergy, Haloperidol, Haloperidol lactate, Shellfish allergy, Cephalexin, Cogentin [benztropine], Ingrezza [valbenazine tosylate], Risperidone, Risperidone and related, Strawberry extract, and Geodon [ziprasidone]  Medications:  Facility Ordered Medications  Medication   alum & mag hydroxide-simeth (MAALOX/MYLANTA) 200-200-20 MG/5ML suspension 30 mL   magnesium hydroxide (MILK OF MAGNESIA) suspension 30 mL   thiamine (VITAMIN B1) injection 100 mg   [START ON 02/22/2023] thiamine (VITAMIN B1) tablet 100 mg   multivitamin with minerals tablet 1 tablet   LORazepam (ATIVAN) tablet 1 mg   ondansetron (ZOFRAN-ODT) disintegrating tablet 4 mg   LORazepam (ATIVAN) tablet 1 mg   Followed by   Derrill Memo ON 02/23/2023] LORazepam (ATIVAN) tablet 1 mg   Followed by   Derrill Memo ON 02/24/2023] LORazepam (ATIVAN) tablet 1 mg   Followed by   Derrill Memo ON 02/25/2023] LORazepam (ATIVAN) tablet 1 mg   PTA Medications  Medication Sig    fluPHENAZine (PROLIXIN) 10 MG tablet Take 1 tablet (10 mg total) by mouth 2 (two) times daily.   hydrOXYzine (ATARAX) 25 MG tablet Take 1 tablet (25 mg total) by mouth every 6 (six) hours as needed for anxiety.   nicotine (NICODERM CQ - DOSED IN MG/24 HOURS) 21 mg/24hr patch Place 1 patch (21 mg total) onto the skin daily.   sertraline (ZOLOFT) 100 MG tablet Take 1 tablet (100 mg total) by mouth daily.   traZODone (DESYREL) 50 MG tablet Take 1 tablet (50 mg total) by mouth at bedtime as needed for sleep.   gabapentin (NEURONTIN) 100 MG capsule Take 2 capsules (200 mg total) by mouth 3 (three) times daily.   trihexyphenidyl (ARTANE) 5 MG tablet Take 1 tablet (5 mg total) by mouth 3 (three) times daily. (Patient taking differently: Take 5 mg by mouth in the morning and at bedtime.)   amLODipine (NORVASC) 5 MG tablet Take 1 tablet (5 mg total) by mouth daily.   fluPHENAZine (PROLIXIN) 10 MG tablet Take 1 tablet (10 mg total) by mouth 2 (two) times daily.   chlordiazePOXIDE (LIBRIUM) 25 MG capsule 25 mg PO TID x 1 day, then 25 mg PO BID X 1 day, then 25 mg PO QD X 1 day    Medical Decision Making  Patient currently IVC.  He will be admitted to [facility] behavioral health continuous observation unit for treatment and stabilization.  He will be reassessed on 02/22/2023, disposition will be determined at that time.  Laboratory studies ordered including CBC, CMP, ethanol, magnesium, prolactin and TSH.  Urine drug screen ordered.  EKG order initiated.  Current medications: -Acetaminophen 650 mg every 6 as needed/mild pain -Maalox 30 mL oral every 4 as needed/digestion -Magnesium hydroxide 30 mL daily as needed/mild constipation  CIWA Ativan protocol initiated: -Loperamide 2 to 4 mg oral as needed/diarrhea or loose stools -Lorazepam 1 mg 4 times daily x4 doses, 1 mg 3 times daily x3 doses, 1 mg 2 times daily x2 doses, 1 mg daily x1 dose -Lorazepam 1 mg every 6 hours as needed CIWA greater than  10 -Multivitamin with minerals 1 tablet daily -Ondansetron  disintegrating tablet 4 mg every 6 as needed/nausea or vomiting -Thiamine injection 100 mg IM once -Thiamine tablet 100 mg daily  Initiated home medications including: -Amlodipine 5 mg daily -Fluphenazine 10 mg twice daily-gabapentin 200 mg 3 times daily -NicoDerm nicotine 21 mg transdermal daily -Sertraline 100 mg daily -Trihexyphenidyl 5 mg twice daily with meals -Trazodone 50 mg nightly as needed/sleep    Recommendations  Based on my evaluation the patient does not appear to have an emergency medical condition.  Lucky Rathke, FNP 02/21/23  4:01 PM

## 2023-02-21 NOTE — ED Notes (Signed)
Patient has been moved to flex unit, due to him pacing and getting too close to the other patients.  Patient is now upset stating he does not want to be on that side and wants to go outside and smoke. Patient has been advised that he can not go outside and smoke. He then states he wants to leave to go see his mother.
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
## 2023-02-21 NOTE — ED Notes (Signed)
Patient scored 14 on CIWA due to excessive aggitation, anxiety, tactile disturbances. Ativan being given

## 2023-02-21 NOTE — ED Notes (Signed)
Pt admitted to Continuous assessment unit. Pt is hyperactive, fidgety, stating, ''i've been up for eight days, using the meth, and they took me off my prolixin and were trying to charge me 456 dollars , and I can't afford the prolixin'' Pt skin unremarkable with one exception noted to lower lip blister/wound like area where pt states '' my tooth broke off and cut inside my lip and it ain't healed. '' Pt oriend to unit. Given sandwich juice and yogurt. Pt reports [facility] [place] and states '' how do we know what all is real. It could all not be '' however is cooperative with staff.  Pt is safe.
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
## 2023-02-22 LAB — PROLACTIN: Prolactin: 9.2 ng/mL (ref 3.9–22.7)

## 2023-02-22 NOTE — ED Notes (Signed)
Pt sleeping@this time. Breathing even and unlabored. Will continue to monitor for safety 

## 2023-02-22 NOTE — ED Notes (Signed)
Pt continues to rest quietly.  Breathing even and unlabored.  Pt awoke by verbal prompt to take medications but fell back asleep.  Staff will cont to monitor for safety.

## 2023-02-22 NOTE — ED Notes (Signed)
Pt awoke to verbal prompt and took po  medications without incident. Pt was given apx 120cc of Gatorade to take with medication. He stated he would try and finish the bottle.  No further distress noted.

## 2023-02-22 NOTE — Progress Notes (Signed)
Pt was accepted to [facility] 02/23/2023, pending IVC paperwork faxed to [phone]. Bed assignment: Main campus  Pt meets inpatient criteria per Beatriz Stallion, NP  Attending Physician will be Leta Jungling, MD  Report can be called to: [phone] (this is a pager, please leave call-back number when giving report)  Pt can arrive after 8 AM  Care Team Notified: Beatriz Stallion, NP, Drema Halon, RN, [facility], RN, and [address] LCSWA  [place], [place]  02/22/2023 3:01 PM

## 2023-02-22 NOTE — ED Provider Notes (Signed)
Behavioral Health Progress Note  Date and Time: 02/22/2023 4:18 PM Name: Cole Henderson MRN:  [id number]  Subjective:  Patient states "I am ok, I just need some sleep. I have not been sleeping for days. I need to stay for a few days to get the medicine into my system."  Patient is reassessed, face-to-face, by nurse practitioner. He is reclined in observation area upon my approach, appears asleep. He is easily awakened. Become irritable and states "I just want to sleep right now!"  Patient endorses chronic auditory hallucinations. He denies visual hallucinations. Denies command hallucinations. He denies suicidal and homicidal ideations.   [place] compliant with medications during this admission. He endorses average sleep and appetitive. Verbalizes agreement with treatment plan to include inpatient psychiatric hospitalization.   Patient offered support and encouragement.   Diagnosis:  Final diagnoses:  Amphetamine abuse (HCC)  Schizophrenia, unspecified type ([place])  Alcohol use    Total Time spent with patient: 30 minutes  Past Psychiatric History: amphetamine abuse, paranoid schizophrenia, insomnia, anxiety, alcohol use with alcohol withdrawal syndrome Past Medical History: migraine, hypertension, GERD Family History: none reported Family Psychiatric  History: none reported Social History: chronic substance use. unemployed  Additional Social History:    Pain Medications: SEE MAR Prescriptions: SEE MAR Over the Counter: SEE MAR History of alcohol / drug use?: Yes Longest period of sobriety (when/how long): Unknown Negative Consequences of Use: Financial, Personal relationships Withdrawal Symptoms: Agitation Name of Substance 1: Alcohol 1 - Age of First Use: teens 1 - Amount (size/oz): [place] whiskey 1 pint 1 - Frequency: daily 1 - Duration: on-going 1 - Last Use / Amount: Today; I pint of [place] whisky 1 - Method of Aquiring: varies 1- Route of Use: oral                   Sleep: Good  Appetite:  Fair  Current Medications:  Current Facility-Administered Medications  Medication Dose Route Frequency Provider Last Rate Last Admin   alum & mag hydroxide-simeth (MAALOX/MYLANTA) [id number] MG/5ML suspension 30 mL  30 mL Oral Q4H PRN Lucky Rathke, FNP       amLODipine (NORVASC) tablet 5 mg  5 mg Oral Daily Lucky Rathke, FNP   5 mg at 02/22/23 1001   fluPHENAZine (PROLIXIN) tablet 10 mg  10 mg Oral BID Lucky Rathke, FNP   10 mg at 02/22/23 1000   gabapentin (NEURONTIN) capsule 200 mg  200 mg Oral TID Lucky Rathke, FNP   200 mg at 02/22/23 1000   LORazepam (ATIVAN) tablet 1 mg  1 mg Oral Q6H PRN Lucky Rathke, FNP   1 mg at 02/21/23 2202   LORazepam (ATIVAN) tablet 1 mg  1 mg Oral QID Lucky Rathke, FNP   1 mg at 02/22/23 1432   Followed by   Derrill Memo ON 02/23/2023] LORazepam (ATIVAN) tablet 1 mg  1 mg Oral TID Lucky Rathke, FNP       Followed by   Derrill Memo ON 02/24/2023] LORazepam (ATIVAN) tablet 1 mg  1 mg Oral BID Lucky Rathke, FNP       Followed by   Derrill Memo ON 02/25/2023] LORazepam (ATIVAN) tablet 1 mg  1 mg Oral Daily Lucky Rathke, FNP       magnesium hydroxide (MILK OF MAGNESIA) suspension 30 mL  30 mL Oral Daily PRN Lucky Rathke, FNP       multivitamin with minerals tablet 1 tablet  1 tablet Oral Daily Beatriz Stallion  L, FNP   1 tablet at 02/22/23 1001   nicotine (NICODERM CQ - dosed in mg/24 hours) patch 21 mg  21 mg Transdermal Daily Lucky Rathke, FNP   21 mg at 02/22/23 1024   ondansetron (ZOFRAN-ODT) disintegrating tablet 4 mg  4 mg Oral Q6H PRN Lucky Rathke, FNP       sertraline (ZOLOFT) tablet 100 mg  100 mg Oral Daily Lucky Rathke, FNP   100 mg at 02/22/23 1001   thiamine (VITAMIN B1) tablet 100 mg  100 mg Oral Daily Lucky Rathke, FNP   100 mg at 02/22/23 1001   traZODone (DESYREL) tablet 50 mg  50 mg Oral QHS PRN Lucky Rathke, FNP   50 mg at 02/21/23 2125   trihexyphenidyl (ARTANE) tablet 5 mg  5 mg Oral BID WC Lucky Rathke, FNP   5 mg at  02/22/23 1001   Current Outpatient Medications  Medication Sig Dispense Refill   amLODipine (NORVASC) 5 MG tablet Take 1 tablet (5 mg total) by mouth daily. 30 tablet 0   fluPHENAZine (PROLIXIN) 10 MG tablet Take 1 tablet (10 mg total) by mouth 2 (two) times daily. 20 tablet 0   gabapentin (NEURONTIN) 100 MG capsule Take 2 capsules (200 mg total) by mouth 3 (three) times daily. 180 capsule 0   sertraline (ZOLOFT) 100 MG tablet Take 1 tablet (100 mg total) by mouth daily. 30 tablet 0   traZODone (DESYREL) 50 MG tablet Take 1 tablet (50 mg total) by mouth at bedtime as needed for sleep. 30 tablet 0   trihexyphenidyl (ARTANE) 5 MG tablet Take 1 tablet (5 mg total) by mouth 3 (three) times daily. (Patient taking differently: Take 5 mg by mouth in the morning and at bedtime.) 90 tablet 0   chlordiazePOXIDE (LIBRIUM) 25 MG capsule 25 mg PO TID x 1 day, then 25 mg PO BID X 1 day, then 25 mg PO QD X 1 day (Patient not taking: Reported on 02/22/2023) 6 capsule 0    Labs  Lab Results:  Admission on 02/21/2023  Component Date Value Ref Range Status   SARS Coronavirus 2 by RT PCR 02/21/2023 NEGATIVE  NEGATIVE Final   Influenza A by PCR 02/21/2023 NEGATIVE  NEGATIVE Final   Influenza B by PCR 02/21/2023 NEGATIVE  NEGATIVE Final   Comment: (NOTE) The Xpert Xpress SARS-CoV-2/FLU/RSV plus assay is intended as an aid in the diagnosis of influenza from Nasopharyngeal swab specimens and should not be used as a sole basis for treatment. Nasal washings and aspirates are unacceptable for Xpert Xpress SARS-CoV-2/FLU/RSV testing.  Fact Sheet for Patients: EntrepreneurPulse.com.au  Fact Sheet for Healthcare Providers: IncredibleEmployment.be  This test is not yet approved or cleared by the [place] FDA and has been authorized for detection and/or diagnosis of SARS-CoV-2 by FDA under an Emergency Use Authorization (EUA). This EUA will remain in effect (meaning this  test can be used) for the duration of the COVID-19 declaration under Section 564(b)(1) of the Act, 21 U.S.C. section 360bbb-3(b)(1), unless the authorization is terminated or revoked.     Resp Syncytial Virus by PCR 02/21/2023 NEGATIVE  NEGATIVE Final   Comment: (NOTE) Fact Sheet for Patients: EntrepreneurPulse.com.au  Fact Sheet for Healthcare Providers: IncredibleEmployment.be  This test is not yet approved or cleared by the [place] FDA and has been authorized for detection and/or diagnosis of SARS-CoV-2 by FDA under an Emergency Use Authorization (EUA). This EUA will remain in effect (meaning this test can  be used) for the duration of the COVID-19 declaration under Section 564(b)(1) of the Act, 21 U.S.C. section 360bbb-3(b)(1), unless the authorization is terminated or revoked.  Performed at [facility], [place] [address], [place], [place] [address]    WBC 02/21/2023 7.8  4.0 - 10.5 K/uL Final   RBC 02/21/2023 4.75  4.22 - 5.81 MIL/uL Final   Hemoglobin 02/21/2023 12.8 (L)  13.0 - 17.0 g/dL Final   HCT 02/21/2023 39.1  39.0 - 52.0 % Final   MCV 02/21/2023 82.3  80.0 - 100.0 fL Final   MCH 02/21/2023 26.9  26.0 - 34.0 pg Final   MCHC 02/21/2023 32.7  30.0 - 36.0 g/dL Final   RDW 02/21/2023 15.4  11.5 - 15.5 % Final   Platelets 02/21/2023 269  150 - 400 K/uL Final   nRBC 02/21/2023 0.0  0.0 - 0.2 % Final   Neutrophils Relative % 02/21/2023 61  % Final   Neutro Abs 02/21/2023 4.7  1.7 - 7.7 K/uL Final   Lymphocytes Relative 02/21/2023 24  % Final   Lymphs Abs 02/21/2023 1.9  0.7 - 4.0 K/uL Final   Monocytes Relative 02/21/2023 8  % Final   Monocytes Absolute 02/21/2023 0.6  0.1 - 1.0 K/uL Final   Eosinophils Relative 02/21/2023 6  % Final   Eosinophils Absolute 02/21/2023 0.4  0.0 - 0.5 K/uL Final   Basophils Relative 02/21/2023 1  % Final   Basophils Absolute 02/21/2023 0.1  0.0 - 0.1 K/uL Final   Immature Granulocytes  02/21/2023 0  % Final   Abs Immature Granulocytes 02/21/2023 0.02  0.00 - 0.07 K/uL Final   Performed at [facility], [place] [address], [place], [place] [address]   Sodium 02/21/2023 137  135 - 145 mmol/L Final   Potassium 02/21/2023 4.1  3.5 - 5.1 mmol/L Final   Chloride 02/21/2023 99  98 - 111 mmol/L Final   CO2 02/21/2023 25  22 - 32 mmol/L Final   Glucose, Bld 02/21/2023 102 (H)  70 - 99 mg/dL Final   Glucose reference range applies only to samples taken after fasting for at least 8 hours.   BUN 02/21/2023 17  6 - 20 mg/dL Final   Creatinine, Ser 02/21/2023 0.85  0.61 - 1.24 mg/dL Final   Calcium 02/21/2023 9.4  8.9 - 10.3 mg/dL Final   Total Protein 02/21/2023 6.7  6.5 - 8.1 g/dL Final   Albumin 02/21/2023 4.1  3.5 - 5.0 g/dL Final   AST 02/21/2023 19  15 - 41 U/L Final   ALT 02/21/2023 15  0 - 44 U/L Final   Alkaline Phosphatase 02/21/2023 56  38 - 126 U/L Final   Total Bilirubin 02/21/2023 0.5  0.3 - 1.2 mg/dL Final   GFR, Estimated 02/21/2023 >60  >60 mL/min Final   Comment: (NOTE) Calculated using the CKD-EPI Creatinine Equation (2021)    Anion gap 02/21/2023 13  5 - 15 Final   Performed at [facility] [address], [place], [place] [address]   Magnesium 02/21/2023 2.1  1.7 - 2.4 mg/dL Final   Performed at [facility] [address] [place], [place] [address]   Alcohol, Ethyl (B) 02/21/2023 <10  <10 mg/dL Final   Comment: (NOTE) Lowest detectable limit for serum alcohol is 10 mg/dL.  For medical purposes only. Performed at [facility], [place] [address] [place], [place] [address]    Prolactin 02/21/2023 9.2  3.9 - 22.7 ng/mL  Final   Comment: (NOTE) Performed At: [facility] [place], [place] [id number] Rush Farmer MD [id number]    POC Amphetamine UR 02/21/2023 Positive (A)  NONE DETECTED (Cut Off Level 1000 ng/mL) Final   POC Secobarbital (BAR) 02/21/2023 Positive (A)  NONE DETECTED (Cut Off Level 300 ng/mL)  Final   POC Buprenorphine (BUP) 02/21/2023 None Detected  NONE DETECTED (Cut Off Level 10 ng/mL) Final   POC Oxazepam (BZO) 02/21/2023 None Detected  NONE DETECTED (Cut Off Level 300 ng/mL) Final   POC Cocaine UR 02/21/2023 Positive (A)  NONE DETECTED (Cut Off Level 300 ng/mL) Final   POC Methamphetamine UR 02/21/2023 Positive (A)  NONE DETECTED (Cut Off Level 1000 ng/mL) Final   POC Morphine 02/21/2023 None Detected  NONE DETECTED (Cut Off Level 300 ng/mL) Final   POC Methadone UR 02/21/2023 None Detected  NONE DETECTED (Cut Off Level 300 ng/mL) Final   POC Oxycodone UR 02/21/2023 None Detected  NONE DETECTED (Cut Off Level 100 ng/mL) Final   POC Marijuana UR 02/21/2023 Positive (A)  NONE DETECTED (Cut Off Level 50 ng/mL) Final   TSH 02/21/2023 1.487  0.350 - 4.500 uIU/mL Final   Comment: Performed by a 3rd Generation assay with a functional sensitivity of <=0.01 uIU/mL. Performed at [facility], [place] [address], [place], [place] [address]   No results displayed because visit has over 200 results.    Admission on 09/04/2022, Discharged on 09/10/2022  Component Date Value Ref Range Status   TSH 09/06/2022 1.653  0.350 - 4.500 uIU/mL Final   Comment: Performed by a 3rd Generation assay with a functional sensitivity of <=0.01 uIU/mL. Performed at [facility], [place] [address], [place], [place] [address]    Hgb A1c MFr Bld 09/06/2022 5.5  4.8 - 5.6 % Final   Comment: (NOTE) Pre diabetes:          5.7%-6.4%  Diabetes:              >6.4%  Glycemic control for   <7.0% adults with diabetes    Mean Plasma Glucose 09/06/2022 111.15  mg/dL Final   Performed at [facility], [place] [address], [place], [place] [address]   Cholesterol 09/06/2022 163  0 - 200 mg/dL Final   Triglycerides 09/06/2022 172 (H)  <150 mg/dL Final   HDL 09/06/2022 52  >40 mg/dL Final   Total CHOL/HDL Ratio 09/06/2022 3.1  RATIO Final   VLDL 09/06/2022 34  0 - 40 mg/dL Final   LDL  Cholesterol 09/06/2022 77  0 - 99 mg/dL Final   Comment:        Total Cholesterol/HDL:CHD Risk Coronary Heart Disease Risk Table                     Men   Women  1/2 Average Risk   3.4   3.3  Average Risk       5.0   4.4  2 X Average Risk   9.6   7.1  3 X Average Risk  23.4   11.0        Use the calculated Patient Ratio above and the CHD Risk Table to determine the patient's CHD Risk.        ATP III CLASSIFICATION (LDL):  <100     mg/dL   Optimal  100-129  mg/dL   Near or Above  Optimal  130-159  mg/dL   Borderline  160-189  mg/dL   High  >190     mg/dL   Very High Performed at [facility] [address] [place], [place] [address]    Vit D, 25-Hydroxy 09/06/2022 33.57  30 - 100 ng/mL Final   Comment: (NOTE) Vitamin D deficiency has been defined by the [place] practice guideline as a level of serum 25-OH  vitamin D less than 20 ng/mL (1,2). The Endocrine Society went on to  further define vitamin D insufficiency as a level between 21 and 29  ng/mL (2).  1. IOM (Institute of Medicine). 2010. Dietary reference intakes for  calcium and D. [place]: The [employer]. 2. Holick MF, Binkley [place], Bischoff-Ferrari HA, et al. Evaluation,  treatment, and prevention of vitamin D deficiency: an Endocrine  Society clinical practice guideline, JCEM. 2011 Jul; 96(7): 1911-30.  Performed at [facility], [place] [address] [place], [place] [address]    Vitamin B-12 09/06/2022 290  180 - 914 pg/mL Final   Comment: (NOTE) This assay is not validated for testing neonatal or myeloproliferative syndrome specimens for Vitamin B12 levels. Performed at [facility], [place] [address] [place], [place] [address]   Admission on 09/01/2022, Discharged on 09/04/2022  Component Date Value Ref Range Status   Sodium 09/02/2022 140  135 - 145 mmol/L Final   Potassium 09/02/2022 4.2  3.5  - 5.1 mmol/L Final   Chloride 09/02/2022 103  98 - 111 mmol/L Final   CO2 09/02/2022 28  22 - 32 mmol/L Final   Glucose, Bld 09/02/2022 131 (H)  70 - 99 mg/dL Final   Glucose reference range applies only to samples taken after fasting for at least 8 hours.   BUN 09/02/2022 10  6 - 20 mg/dL Final   Creatinine, Ser 09/02/2022 0.97  0.61 - 1.24 mg/dL Final   Calcium 09/02/2022 9.5  8.9 - 10.3 mg/dL Final   Total Protein 09/02/2022 6.9  6.5 - 8.1 g/dL Final   Albumin 09/02/2022 4.1  3.5 - 5.0 g/dL Final   AST 09/02/2022 33  15 - 41 U/L Final   ALT 09/02/2022 38  0 - 44 U/L Final   Alkaline Phosphatase 09/02/2022 46  38 - 126 U/L Final   Total Bilirubin 09/02/2022 0.6  0.3 - 1.2 mg/dL Final   GFR, Estimated 09/02/2022 >60  >60 mL/min Final   Comment: (NOTE) Calculated using the CKD-EPI Creatinine Equation (2021)    Anion gap 09/02/2022 9  5 - 15 Final   Performed at [facility], [place] [address], [place], [place] [address]   Alcohol, Ethyl (B) 09/02/2022 <10  <10 mg/dL Final   Comment: (NOTE) Lowest detectable limit for serum alcohol is 10 mg/dL.  For medical purposes only. Performed at [facility], [place] [address] [place], [place] [address]    WBC 09/02/2022 7.4  4.0 - 10.5 K/uL Final   RBC 09/02/2022 4.62  4.22 - 5.81 MIL/uL Final   Hemoglobin 09/02/2022 12.8 (L)  13.0 - 17.0 g/dL Final   HCT 09/02/2022 39.5  39.0 - 52.0 % Final   MCV 09/02/2022 85.5  80.0 - 100.0 fL Final   MCH 09/02/2022 27.7  26.0 - 34.0 pg Final   MCHC 09/02/2022 32.4  30.0 - 36.0 g/dL Final   RDW 09/02/2022 13.6  11.5 - 15.5 % Final   Platelets 09/02/2022 274  150 - 400 K/uL Final   nRBC 09/02/2022 0.0  0.0 - 0.2 % Final   Performed at [facility], [place] [address] [place], [place] [address]   SARS Coronavirus 2 by RT PCR 09/02/2022 NEGATIVE  NEGATIVE Final   Comment: (NOTE) SARS-CoV-2 target nucleic acids are NOT DETECTED.  The SARS-CoV-2 RNA is generally detectable in upper and  lower respiratory specimens during the acute phase of infection. The lowest concentration of SARS-CoV-2 viral copies this assay can detect is 250 copies / mL. A negative result does not preclude SARS-CoV-2 infection and should not be used as the sole basis for treatment or other patient management decisions.  A negative result may occur with improper specimen collection / handling, submission of specimen other than nasopharyngeal swab, presence of viral mutation(s) within the areas targeted by this assay, and inadequate number of viral copies (<250 copies / mL). A negative result must be combined with clinical observations, patient history, and epidemiological information.  Fact Sheet for Patients:   https://www.patel.info/  Fact Sheet for Healthcare Providers: https://hall.com/  This test is not yet approved or                           cleared by the [place] FDA and has been authorized for detection and/or diagnosis of SARS-CoV-2 by FDA under an Emergency Use Authorization (EUA).  This EUA will remain in effect (meaning this test can be used) for the duration of the COVID-19 declaration under Section 564(b)(1) of the Act, 21 U.S.C. section 360bbb-3(b)(1), unless the authorization is terminated or revoked sooner.  Performed at [facility], [place] [address], [place], [place] [address]    SARS Coronavirus 2 by RT PCR 09/02/2022 NEGATIVE  NEGATIVE Final   Comment: (NOTE) SARS-CoV-2 target nucleic acids are NOT DETECTED.  The SARS-CoV-2 RNA is generally detectable in upper respiratory specimens during the acute phase of infection. The lowest concentration of SARS-CoV-2 viral copies this assay can detect is 138 copies/mL. A negative result does not preclude SARS-Cov-2 infection and should not be used as the sole basis for treatment or other patient management decisions. A negative result may occur with  improper specimen  collection/handling, submission of specimen other than nasopharyngeal swab, presence of viral mutation(s) within the areas targeted by this assay, and inadequate number of viral copies(<138 copies/mL). A negative result must be combined with clinical observations, patient history, and epidemiological information. The expected result is Negative.  Fact Sheet for Patients:  EntrepreneurPulse.com.au  Fact Sheet for Healthcare Providers:  IncredibleEmployment.be  This test is no                          t yet approved or cleared by the [place] FDA and  has been authorized for detection and/or diagnosis of SARS-CoV-2 by FDA under an Emergency Use Authorization (EUA). This EUA will remain  in effect (meaning this test can be used) for the duration of the COVID-19 declaration under Section 564(b)(1) of the Act, 21 U.S.C.section 360bbb-3(b)(1), unless the authorization is terminated  or revoked sooner.       Influenza A by PCR 09/02/2022 NEGATIVE  NEGATIVE Final   Influenza B by PCR 09/02/2022 NEGATIVE  NEGATIVE Final   Comment: (NOTE) The Xpert Xpress SARS-CoV-2/FLU/RSV plus assay is intended as an aid in the diagnosis of influenza from Nasopharyngeal swab specimens and should not be used as a sole basis  for treatment. Nasal washings and aspirates are unacceptable for Xpert Xpress SARS-CoV-2/FLU/RSV testing.  Fact Sheet for Patients: EntrepreneurPulse.com.au  Fact Sheet for Healthcare Providers: IncredibleEmployment.be  This test is not yet approved or cleared by the [place] FDA and has been authorized for detection and/or diagnosis of SARS-CoV-2 by FDA under an Emergency Use Authorization (EUA). This EUA will remain in effect (meaning this test can be used) for the duration of the COVID-19 declaration under Section 564(b)(1) of the Act, 21 U.S.C. section 360bbb-3(b)(1), unless the authorization is  terminated or revoked.  Performed at [facility], [place] [address] [place], [place] [address]    Opiates 09/02/2022 POSITIVE (A)  NONE DETECTED Final   Cocaine 09/02/2022 NONE DETECTED  NONE DETECTED Final   Benzodiazepines 09/02/2022 NONE DETECTED  NONE DETECTED Final   Amphetamines 09/02/2022 POSITIVE (A)  NONE DETECTED Final   Tetrahydrocannabinol 09/02/2022 NONE DETECTED  NONE DETECTED Final   Barbiturates 09/02/2022 NONE DETECTED  NONE DETECTED Final   Comment: (NOTE) DRUG SCREEN FOR MEDICAL PURPOSES ONLY.  IF CONFIRMATION IS NEEDED FOR ANY PURPOSE, NOTIFY LAB WITHIN 5 DAYS.  LOWEST DETECTABLE LIMITS FOR URINE DRUG SCREEN Drug Class                     Cutoff (ng/mL) Amphetamine and metabolites    1000 Barbiturate and metabolites    200 Benzodiazepine                 [id number] Tricyclics and metabolites     300 Opiates and metabolites        300 Cocaine and metabolites        300 THC                            50 Performed at [facility], [place] [address], [place], [place] [address]    Color, Urine 09/02/2022 YELLOW  YELLOW Final   APPearance 09/02/2022 CLEAR  CLEAR Final   Specific Gravity, Urine 09/02/2022 1.021  1.005 - 1.030 Final   pH 09/02/2022 5.0  5.0 - 8.0 Final   Glucose, UA 09/02/2022 NEGATIVE  NEGATIVE mg/dL Final   Hgb urine dipstick 09/02/2022 NEGATIVE  NEGATIVE Final   Bilirubin Urine 09/02/2022 NEGATIVE  NEGATIVE Final   Ketones, ur 09/02/2022 NEGATIVE  NEGATIVE mg/dL Final   Protein, ur 09/02/2022 NEGATIVE  NEGATIVE mg/dL Final   Nitrite 09/02/2022 NEGATIVE  NEGATIVE Final   Leukocytes,Ua 09/02/2022 NEGATIVE  NEGATIVE Final   RBC / HPF 09/02/2022 0-5  0 - 5 RBC/hpf Final   WBC, UA 09/02/2022 0-5  0 - 5 WBC/hpf Final   Bacteria, UA 09/02/2022 NONE SEEN  NONE SEEN Final   Mucus 09/02/2022 PRESENT   Final   Ca Oxalate Crys, UA 09/02/2022 PRESENT   Final   Performed at [facility], [place] [address], [place], [place] [address]   Acetaminophen  (Tylenol), Serum 09/03/2022 <10 (L)  10 - 30 ug/mL Final   Comment: (NOTE) Therapeutic concentrations vary significantly. A range of 10-30 ug/mL  may be an effective concentration for many patients. However, some  are best treated at concentrations outside of this range. Acetaminophen concentrations >150 ug/mL at 4 hours after ingestion  and >50 ug/mL at 12 hours after ingestion are often associated with  toxic reactions.  Performed at [facility], [place] [address], [place], [place] [address]     Blood Alcohol level:  Lab Results  Component Value Date   ETH <10 02/21/2023   ETH <10 0000000    Metabolic Disorder Labs: Lab Results  Component Value Date   HGBA1C 5.5 01/24/2023   MPG 111.15 01/24/2023   MPG 111.15 09/06/2022   Lab Results  Component Value Date   PROLACTIN 9.2 02/21/2023   Lab Results  Component Value Date   CHOL 163 09/06/2022   TRIG 172 (H) 09/06/2022   HDL 52 09/06/2022   CHOLHDL 3.1 09/06/2022   VLDL 34 09/06/2022   LDLCALC 77 09/06/2022   LDLCALC 53 05/13/2022    Therapeutic Lab Levels: No results found for: "LITHIUM" No results found for: "VALPROATE" Lab Results  Component Value Date   CBMZ 5.2 05/10/2009    Physical Findings   AIMS    Flowsheet Row Admission (Discharged) from 09/04/2022 in [facility]  AIMS Total Score 3      AUDIT    Flowsheet Row Admission (Discharged) from 09/04/2022 in [facility]  Alcohol Use Disorder Identification Test Final Score (AUDIT) 0      CAGE-AID    Flowsheet Row ED to Hosp-Admission (Discharged) from 01/23/2023 in [facility] Score 3      PHQ2-9    [place] ED from 11/30/2020 in [facility] Department at [facility] ED from 08/28/2020 in [facility]  PHQ-2 Total Score 2 0  PHQ-9 Total Score 19 9      [place] ED from  02/21/2023 in [facility] ED from 02/12/2023 in [facility] Emergency Department at [facility] ED to Hosp-Admission (Discharged) from 01/23/2023 in [facility] No Risk No Risk No Risk        Musculoskeletal  Strength & Muscle Tone: within normal limits Gait & Station: unable to assess Patient leans: unable to assess  Psychiatric Specialty Exam  Presentation  General Appearance:  Casual  Eye Contact: Minimal  Speech: Clear and Coherent  Speech Volume: Normal  Handedness: Right   Mood and Affect  Mood: Euthymic  Affect: Labile   Thought Process  Thought Processes: Coherent; Goal Directed  Descriptions of Associations:Intact  Orientation:Full (Time, Place and Person)  Thought Content:Logical  Diagnosis of Schizophrenia or Schizoaffective disorder in past: Yes  Duration of Psychotic Symptoms: Greater than six months   Hallucinations:Hallucinations: Auditory Description of Auditory Hallucinations: "I hear voices, they are whispers when I have my medications"  Ideas of Reference:None  Suicidal Thoughts:Suicidal Thoughts: No  Homicidal Thoughts:Homicidal Thoughts: No   Sensorium  Memory: Immediate Fair  Judgment: Fair  Insight: Present   Executive Functions  Concentration: Fair  Attention Span: Fair  Recall: [place] of Knowledge: Fair  Language: Fair   Psychomotor Activity  Psychomotor Activity: Psychomotor Activity: Normal   Assets  Assets: Communication Skills   Sleep  Sleep: Sleep: Good   Nutritional Assessment (For OBS and FBC admissions only) Has the patient had a weight loss or gain of 10 pounds or more in the last 3 months?: No Has the patient had a decrease in food intake/or appetite?: No Does the patient have dental problems?: No Does the patient have eating habits or behaviors that may be indicators of an eating disorder  including binging or inducing vomiting?: No Has the patient recently lost weight without trying?: 0 Has the patient been eating poorly because of a  decreased appetite?: 0 Malnutrition Screening Tool Score: 0    Physical Exam  Physical Exam Vitals and nursing note reviewed.  Constitutional:      Appearance: He is well-developed.  HENT:     Head: Normocephalic.     Nose: Nose normal.  Cardiovascular:     Rate and Rhythm: Normal rate.  Pulmonary:     Effort: Pulmonary effort is normal.  Musculoskeletal:        General: Normal range of motion.  Skin:    General: Skin is warm and dry.  Neurological:     Mental Status: He is alert and oriented to person, place, and time.  Psychiatric:        Attention and Perception: Attention normal. He perceives auditory hallucinations.        Mood and Affect: Mood normal. Affect is labile.        Speech: Speech normal.        Behavior: Behavior normal. Behavior is cooperative.    Review of Systems  Constitutional: Negative.   HENT: Negative.    Eyes: Negative.   Respiratory: Negative.    Cardiovascular: Negative.   Gastrointestinal: Negative.   Genitourinary: Negative.   Musculoskeletal: Negative.   Skin: Negative.   Neurological: Negative.   Psychiatric/Behavioral:  Positive for hallucinations and substance abuse.    Blood pressure 117/71, pulse 97, temperature 98 F (36.7 C), temperature source Oral, resp. rate 18, SpO2 98 %. There is no height or weight on file to calculate BMI.  Treatment Plan Summary: Patient IVC. Daily contact with patient to assess and evaluate symptoms and progress in treatment Accepted for inpatient psychiatric admission at [facility] by Dr Leta Jungling for arrival on 02/23/2023.  Lucky Rathke, FNP 02/22/2023 4:18 PM

## 2023-02-22 NOTE — ED Notes (Signed)
Pt given a juice 240cc.  Also asked if he wanted to use the rest room but declined.

## 2023-02-22 NOTE — ED Notes (Signed)
Pt continues to sleep soundly.  He does awaken to verbal prompting. Pt was asked to sit up and followed commands.  Pt was given ativan pill in pill cup but dropped pill cup.  Pt began reaching in the air like he was attempting to grab pill cup in the air.  Pt was asked where he was and was unable to answer.  He did take and swallow the pill after it was placed in his mouth.   Pt was asked if he needed to use the restroom and he declined.   Pt's vitals at this time were assessed and documented.  No obvious tremors or diaphoresis noted.   No other signs of withdrawal or delirium noted.  Pt layed back down and fell back asleep.  Beatriz Stallion NP was notified of possible VH  but due to pt's lethargy he will be reassessed again shortly.  Breathing even and unlabored.

## 2023-02-22 NOTE — ED Notes (Signed)
Pt again woke up to verbal prompt   he sat up in bed.   Was given po medication as ordered. Pt finished the Gatorade that was provided. (351ml)  To him. He was asked if he needed to use the bathroom and he declined.  Pt laid back down and went to sleep again.

## 2023-02-22 NOTE — ED Notes (Signed)
Patient observed/assessed in room in bed appearing in no immediate distress resting peacefully. Q15 minute checks continued by MHT and nursing staff. Will continue to monitor and support. 

## 2023-02-22 NOTE — ED Notes (Signed)
Pt resting at this time.  Breathing even and unlabored. Pt is in view of nursing station and being monitored.

## 2023-02-22 NOTE — ED Notes (Signed)
Pt continues to rest quietly.  Breathing even and unlabored.  Pt is in view of nursing station.

## 2023-02-22 NOTE — Progress Notes (Signed)
Inpatient Behavioral Health Placement  Pt meets inpatient criteria per Beatriz Stallion, FNP. There are no available beds in the [facility] system per [place], RN.  Referral was sent to the following facilities;    Destination  Service Provider Address Phone Fax  [facility]  [address], [place] [address] [phone]  [place]  [address] [place] [id number] [phone]  [facility]  [address], [place] [address] [phone] [address] [place] [address] [phone]  [facility]  [place], [place] [address] [place]  [facility] [place] [address] [name]  [facility]  [address], [place] [address] [phone]  [facility]  [place], [place] [address] [phone]  [facility]  [address] [place] [address] [place]  [facility]  [address] [place] [address] [phone]  [facility]  [address], [place] [address] [phone]  [place] [address] [place] [address] [phone]  [facility]  [address], [place] [address] [phone] [place]  [address] [place] [address] [phone] [facility]  [address], [place] [address] [phone]  [facility]  [address], [place] [address] [phone] [facility]  [address], [place]  [place] [address] [phone] [address] [place], [place] [address] [phone] [facility]  [place], [place] [address] [phone]  [facility]  [address] [place], [place] [address] [phone]  [facility]  [address] [place] [address] [phone]   Situation ongoing,  CSW will follow up.   Benjaman Kindler, MSW, [facility] 02/22/2023  @ 12:32 PM

## 2023-02-23 DIAGNOSIS — F1420 Cocaine dependence, uncomplicated: Secondary | ICD-10-CM | POA: Diagnosis not present

## 2023-02-23 DIAGNOSIS — G259 Extrapyramidal and movement disorder, unspecified: Secondary | ICD-10-CM | POA: Diagnosis not present

## 2023-02-23 DIAGNOSIS — I10 Essential (primary) hypertension: Secondary | ICD-10-CM | POA: Diagnosis not present

## 2023-02-23 DIAGNOSIS — F23 Brief psychotic disorder: Secondary | ICD-10-CM | POA: Diagnosis not present

## 2023-02-23 DIAGNOSIS — F1520 Other stimulant dependence, uncomplicated: Secondary | ICD-10-CM | POA: Diagnosis not present

## 2023-02-23 DIAGNOSIS — Z9151 Personal history of suicidal behavior: Secondary | ICD-10-CM | POA: Diagnosis not present

## 2023-02-23 DIAGNOSIS — Z91411 Personal history of adult psychological abuse: Secondary | ICD-10-CM | POA: Diagnosis not present

## 2023-02-23 DIAGNOSIS — F1020 Alcohol dependence, uncomplicated: Secondary | ICD-10-CM | POA: Diagnosis not present

## 2023-02-23 DIAGNOSIS — Z91148 Patient's other noncompliance with medication regimen for other reason: Secondary | ICD-10-CM | POA: Diagnosis not present

## 2023-02-23 DIAGNOSIS — F1220 Cannabis dependence, uncomplicated: Secondary | ICD-10-CM | POA: Diagnosis not present

## 2023-02-23 DIAGNOSIS — F17210 Nicotine dependence, cigarettes, uncomplicated: Secondary | ICD-10-CM | POA: Diagnosis not present

## 2023-02-23 DIAGNOSIS — K0889 Other specified disorders of teeth and supporting structures: Secondary | ICD-10-CM | POA: Diagnosis not present

## 2023-02-23 DIAGNOSIS — Z62810 Personal history of physical and sexual abuse in childhood: Secondary | ICD-10-CM | POA: Diagnosis not present

## 2023-02-23 DIAGNOSIS — Z5986 Financial insecurity: Secondary | ICD-10-CM | POA: Diagnosis not present

## 2023-02-23 DIAGNOSIS — F209 Schizophrenia, unspecified: Secondary | ICD-10-CM | POA: Diagnosis not present

## 2023-02-23 DIAGNOSIS — G629 Polyneuropathy, unspecified: Secondary | ICD-10-CM | POA: Diagnosis not present

## 2023-02-23 NOTE — Progress Notes (Addendum)
Report given to  nurse Caren Macadam at [facility] was faxed tp [phone].

## 2023-02-23 NOTE — Progress Notes (Addendum)
Received Cole Henderson this AM asleep in his bed, he was awaken by the NP, ate and received his medications. [facility] was called and left a voice mail for report. The sheriff arrived to transport and [facility] was recalled.

## 2023-02-23 NOTE — ED Notes (Signed)
Pt sleeping at present, no distress noted.  Monitoring for safety. 

## 2023-03-06 DIAGNOSIS — F23 Brief psychotic disorder: Secondary | ICD-10-CM | POA: Diagnosis not present

## 2023-03-26 DIAGNOSIS — F062 Psychotic disorder with delusions due to known physiological condition: Secondary | ICD-10-CM | POA: Diagnosis not present

## 2023-03-26 DIAGNOSIS — R259 Unspecified abnormal involuntary movements: Secondary | ICD-10-CM | POA: Diagnosis not present

## 2023-03-26 DIAGNOSIS — F209 Schizophrenia, unspecified: Secondary | ICD-10-CM | POA: Diagnosis not present

## 2023-03-27 DIAGNOSIS — Z720 Tobacco use: Secondary | ICD-10-CM | POA: Diagnosis not present

## 2023-03-27 DIAGNOSIS — F1590 Other stimulant use, unspecified, uncomplicated: Secondary | ICD-10-CM | POA: Diagnosis not present

## 2023-03-27 DIAGNOSIS — F209 Schizophrenia, unspecified: Secondary | ICD-10-CM | POA: Diagnosis not present

## 2023-03-27 DIAGNOSIS — F250 Schizoaffective disorder, bipolar type: Secondary | ICD-10-CM | POA: Diagnosis not present

## 2023-03-27 DIAGNOSIS — R259 Unspecified abnormal involuntary movements: Secondary | ICD-10-CM | POA: Diagnosis not present

## 2023-03-27 DIAGNOSIS — F062 Psychotic disorder with delusions due to known physiological condition: Secondary | ICD-10-CM | POA: Diagnosis not present

## 2023-03-29 DIAGNOSIS — F209 Schizophrenia, unspecified: Secondary | ICD-10-CM | POA: Diagnosis not present

## 2023-03-30 DIAGNOSIS — F209 Schizophrenia, unspecified: Secondary | ICD-10-CM | POA: Diagnosis not present

## 2023-03-31 DIAGNOSIS — F209 Schizophrenia, unspecified: Secondary | ICD-10-CM | POA: Diagnosis not present

## 2023-05-13 DIAGNOSIS — Z5901 Sheltered homelessness: Secondary | ICD-10-CM | POA: Diagnosis not present

## 2023-05-13 DIAGNOSIS — Z79899 Other long term (current) drug therapy: Secondary | ICD-10-CM | POA: Diagnosis not present

## 2023-05-13 DIAGNOSIS — F29 Unspecified psychosis not due to a substance or known physiological condition: Secondary | ICD-10-CM | POA: Diagnosis not present

## 2023-05-13 DIAGNOSIS — Z609 Problem related to social environment, unspecified: Secondary | ICD-10-CM | POA: Diagnosis not present

## 2023-05-13 DIAGNOSIS — F1020 Alcohol dependence, uncomplicated: Secondary | ICD-10-CM | POA: Diagnosis not present

## 2023-05-13 DIAGNOSIS — Z5689 Other problems related to employment: Secondary | ICD-10-CM | POA: Diagnosis not present

## 2023-05-13 DIAGNOSIS — F209 Schizophrenia, unspecified: Secondary | ICD-10-CM | POA: Diagnosis not present

## 2023-05-13 DIAGNOSIS — F17210 Nicotine dependence, cigarettes, uncomplicated: Secondary | ICD-10-CM | POA: Diagnosis not present

## 2023-05-13 DIAGNOSIS — F22 Delusional disorders: Secondary | ICD-10-CM | POA: Diagnosis not present

## 2023-05-13 DIAGNOSIS — R4589 Other symptoms and signs involving emotional state: Secondary | ICD-10-CM | POA: Diagnosis not present

## 2023-05-13 DIAGNOSIS — T424X1A Poisoning by benzodiazepines, accidental (unintentional), initial encounter: Secondary | ICD-10-CM | POA: Diagnosis not present

## 2023-05-13 DIAGNOSIS — Z818 Family history of other mental and behavioral disorders: Secondary | ICD-10-CM | POA: Diagnosis not present

## 2023-05-14 DIAGNOSIS — Z609 Problem related to social environment, unspecified: Secondary | ICD-10-CM | POA: Diagnosis not present

## 2023-05-14 DIAGNOSIS — F29 Unspecified psychosis not due to a substance or known physiological condition: Secondary | ICD-10-CM | POA: Diagnosis not present

## 2023-05-14 DIAGNOSIS — F17210 Nicotine dependence, cigarettes, uncomplicated: Secondary | ICD-10-CM | POA: Diagnosis not present

## 2023-05-14 DIAGNOSIS — Z7902 Long term (current) use of antithrombotics/antiplatelets: Secondary | ICD-10-CM | POA: Diagnosis not present

## 2023-05-14 DIAGNOSIS — I10 Essential (primary) hypertension: Secondary | ICD-10-CM | POA: Diagnosis not present

## 2023-05-14 DIAGNOSIS — Z5689 Other problems related to employment: Secondary | ICD-10-CM | POA: Diagnosis not present

## 2023-05-14 DIAGNOSIS — Z639 Problem related to primary support group, unspecified: Secondary | ICD-10-CM | POA: Diagnosis not present

## 2023-05-14 DIAGNOSIS — Z5900 Homelessness unspecified: Secondary | ICD-10-CM | POA: Diagnosis not present

## 2023-05-14 DIAGNOSIS — T424X1A Poisoning by benzodiazepines, accidental (unintentional), initial encounter: Secondary | ICD-10-CM | POA: Diagnosis not present

## 2023-05-14 DIAGNOSIS — Z818 Family history of other mental and behavioral disorders: Secondary | ICD-10-CM | POA: Diagnosis not present

## 2023-05-14 DIAGNOSIS — F419 Anxiety disorder, unspecified: Secondary | ICD-10-CM | POA: Diagnosis not present

## 2023-05-14 DIAGNOSIS — G4700 Insomnia, unspecified: Secondary | ICD-10-CM | POA: Diagnosis not present

## 2023-05-14 DIAGNOSIS — F1020 Alcohol dependence, uncomplicated: Secondary | ICD-10-CM | POA: Diagnosis not present

## 2023-05-14 DIAGNOSIS — F17200 Nicotine dependence, unspecified, uncomplicated: Secondary | ICD-10-CM | POA: Diagnosis not present

## 2023-05-14 DIAGNOSIS — F22 Delusional disorders: Secondary | ICD-10-CM | POA: Diagnosis not present

## 2023-05-14 DIAGNOSIS — E785 Hyperlipidemia, unspecified: Secondary | ICD-10-CM | POA: Diagnosis not present

## 2023-05-14 DIAGNOSIS — Z7982 Long term (current) use of aspirin: Secondary | ICD-10-CM | POA: Diagnosis not present

## 2023-05-14 DIAGNOSIS — F1520 Other stimulant dependence, uncomplicated: Secondary | ICD-10-CM | POA: Diagnosis not present

## 2023-05-14 DIAGNOSIS — Z91148 Patient's other noncompliance with medication regimen for other reason: Secondary | ICD-10-CM | POA: Diagnosis not present

## 2023-05-14 DIAGNOSIS — Z5901 Sheltered homelessness: Secondary | ICD-10-CM | POA: Diagnosis not present

## 2023-05-14 DIAGNOSIS — Z79899 Other long term (current) drug therapy: Secondary | ICD-10-CM | POA: Diagnosis not present

## 2023-05-14 DIAGNOSIS — F1320 Sedative, hypnotic or anxiolytic dependence, uncomplicated: Secondary | ICD-10-CM | POA: Diagnosis not present

## 2023-05-14 DIAGNOSIS — F250 Schizoaffective disorder, bipolar type: Secondary | ICD-10-CM | POA: Diagnosis not present

## 2023-05-15 DIAGNOSIS — F1320 Sedative, hypnotic or anxiolytic dependence, uncomplicated: Secondary | ICD-10-CM | POA: Diagnosis not present

## 2023-05-15 DIAGNOSIS — F1520 Other stimulant dependence, uncomplicated: Secondary | ICD-10-CM | POA: Diagnosis not present

## 2023-05-15 DIAGNOSIS — F250 Schizoaffective disorder, bipolar type: Secondary | ICD-10-CM | POA: Diagnosis not present

## 2023-05-16 DIAGNOSIS — F250 Schizoaffective disorder, bipolar type: Secondary | ICD-10-CM | POA: Diagnosis not present

## 2023-05-17 DIAGNOSIS — F250 Schizoaffective disorder, bipolar type: Secondary | ICD-10-CM | POA: Diagnosis not present

## 2023-05-18 DIAGNOSIS — F250 Schizoaffective disorder, bipolar type: Secondary | ICD-10-CM | POA: Diagnosis not present

## 2023-05-21 ENCOUNTER — Other Ambulatory Visit (HOSPITAL_COMMUNITY): Payer: Self-pay

## 2023-05-21 DIAGNOSIS — Z560 Unemployment, unspecified: Secondary | ICD-10-CM | POA: Diagnosis not present

## 2023-05-21 DIAGNOSIS — Z87828 Personal history of other (healed) physical injury and trauma: Secondary | ICD-10-CM | POA: Diagnosis not present

## 2023-05-21 DIAGNOSIS — Z765 Malingerer [conscious simulation]: Secondary | ICD-10-CM | POA: Diagnosis not present

## 2023-05-21 DIAGNOSIS — F250 Schizoaffective disorder, bipolar type: Secondary | ICD-10-CM | POA: Diagnosis not present

## 2023-05-21 DIAGNOSIS — E782 Mixed hyperlipidemia: Secondary | ICD-10-CM | POA: Diagnosis not present

## 2023-05-21 DIAGNOSIS — R259 Unspecified abnormal involuntary movements: Secondary | ICD-10-CM | POA: Diagnosis not present

## 2023-05-21 DIAGNOSIS — Z91148 Patient's other noncompliance with medication regimen for other reason: Secondary | ICD-10-CM | POA: Diagnosis not present

## 2023-05-21 DIAGNOSIS — I10 Essential (primary) hypertension: Secondary | ICD-10-CM | POA: Diagnosis not present

## 2023-05-21 DIAGNOSIS — F602 Antisocial personality disorder: Secondary | ICD-10-CM | POA: Diagnosis not present

## 2023-05-21 DIAGNOSIS — R45850 Homicidal ideations: Secondary | ICD-10-CM | POA: Diagnosis not present

## 2023-05-21 DIAGNOSIS — Z5901 Sheltered homelessness: Secondary | ICD-10-CM | POA: Diagnosis not present

## 2023-05-21 DIAGNOSIS — F17210 Nicotine dependence, cigarettes, uncomplicated: Secondary | ICD-10-CM | POA: Diagnosis not present

## 2023-05-21 DIAGNOSIS — Z7982 Long term (current) use of aspirin: Secondary | ICD-10-CM | POA: Diagnosis not present

## 2023-05-21 DIAGNOSIS — Z79899 Other long term (current) drug therapy: Secondary | ICD-10-CM | POA: Diagnosis not present

## 2023-05-23 DIAGNOSIS — F250 Schizoaffective disorder, bipolar type: Secondary | ICD-10-CM | POA: Diagnosis not present

## 2023-05-23 DIAGNOSIS — E782 Mixed hyperlipidemia: Secondary | ICD-10-CM | POA: Diagnosis not present

## 2023-05-23 DIAGNOSIS — F602 Antisocial personality disorder: Secondary | ICD-10-CM | POA: Diagnosis not present

## 2023-05-23 DIAGNOSIS — I10 Essential (primary) hypertension: Secondary | ICD-10-CM | POA: Diagnosis not present

## 2023-05-27 DIAGNOSIS — R4182 Altered mental status, unspecified: Secondary | ICD-10-CM | POA: Diagnosis not present

## 2023-05-27 DIAGNOSIS — Z760 Encounter for issue of repeat prescription: Secondary | ICD-10-CM | POA: Diagnosis not present

## 2023-05-27 DIAGNOSIS — R4781 Slurred speech: Secondary | ICD-10-CM | POA: Diagnosis not present

## 2023-05-27 DIAGNOSIS — R464 Slowness and poor responsiveness: Secondary | ICD-10-CM | POA: Diagnosis not present

## 2023-05-28 DIAGNOSIS — F1910 Other psychoactive substance abuse, uncomplicated: Secondary | ICD-10-CM | POA: Diagnosis not present

## 2023-05-28 DIAGNOSIS — F602 Antisocial personality disorder: Secondary | ICD-10-CM | POA: Diagnosis not present

## 2023-05-28 DIAGNOSIS — Z758 Other problems related to medical facilities and other health care: Secondary | ICD-10-CM | POA: Diagnosis not present

## 2023-05-28 DIAGNOSIS — F1010 Alcohol abuse, uncomplicated: Secondary | ICD-10-CM | POA: Diagnosis not present

## 2023-05-28 DIAGNOSIS — F259 Schizoaffective disorder, unspecified: Secondary | ICD-10-CM | POA: Diagnosis not present

## 2023-05-28 DIAGNOSIS — F250 Schizoaffective disorder, bipolar type: Secondary | ICD-10-CM | POA: Diagnosis not present

## 2023-05-28 DIAGNOSIS — G2401 Drug induced subacute dyskinesia: Secondary | ICD-10-CM | POA: Diagnosis not present

## 2023-05-28 DIAGNOSIS — Z760 Encounter for issue of repeat prescription: Secondary | ICD-10-CM | POA: Diagnosis not present

## 2023-05-28 DIAGNOSIS — Z556 Problems related to health literacy: Secondary | ICD-10-CM | POA: Diagnosis not present

## 2023-07-03 DIAGNOSIS — R059 Cough, unspecified: Secondary | ICD-10-CM | POA: Diagnosis not present

## 2023-07-03 DIAGNOSIS — R079 Chest pain, unspecified: Secondary | ICD-10-CM | POA: Diagnosis not present

## 2023-07-03 DIAGNOSIS — R918 Other nonspecific abnormal finding of lung field: Secondary | ICD-10-CM | POA: Diagnosis not present

## 2023-07-21 DIAGNOSIS — Z1152 Encounter for screening for COVID-19: Secondary | ICD-10-CM | POA: Diagnosis not present

## 2023-07-21 DIAGNOSIS — F1510 Other stimulant abuse, uncomplicated: Secondary | ICD-10-CM | POA: Diagnosis not present

## 2023-07-21 DIAGNOSIS — Z79899 Other long term (current) drug therapy: Secondary | ICD-10-CM | POA: Diagnosis not present

## 2023-08-28 ENCOUNTER — Other Ambulatory Visit: Payer: Self-pay

## 2023-08-28 ENCOUNTER — Emergency Department (HOSPITAL_COMMUNITY)
Admission: EM | Admit: 2023-08-28 | Discharge: 2023-08-28 | Disposition: A | Discharge status: Home / Self Care | Payer: MEDICAID | Attending: Emergency Medicine | Admitting: Emergency Medicine

## 2023-08-28 ENCOUNTER — Encounter (HOSPITAL_COMMUNITY): Payer: Self-pay

## 2023-08-28 DIAGNOSIS — Z79899 Other long term (current) drug therapy: Secondary | ICD-10-CM | POA: Diagnosis not present

## 2023-08-28 DIAGNOSIS — I10 Essential (primary) hypertension: Secondary | ICD-10-CM | POA: Diagnosis not present

## 2023-08-28 DIAGNOSIS — F1510 Other stimulant abuse, uncomplicated: Secondary | ICD-10-CM

## 2023-08-28 DIAGNOSIS — F1590 Other stimulant use, unspecified, uncomplicated: Secondary | ICD-10-CM | POA: Diagnosis present

## 2023-08-28 LAB — CBC WITH DIFFERENTIAL/PLATELET
Abs Immature Granulocytes: 0.02 10*3/uL (ref 0.00–0.07)
Basophils Absolute: 0.1 10*3/uL (ref 0.0–0.1)
Basophils Relative: 1 %
Eosinophils Absolute: 0.5 10*3/uL (ref 0.0–0.5)
Eosinophils Relative: 6 %
HCT: 34.9 % — ABNORMAL LOW (ref 39.0–52.0)
Hemoglobin: 11 g/dL — ABNORMAL LOW (ref 13.0–17.0)
Immature Granulocytes: 0 %
Lymphocytes Relative: 24 %
Lymphs Abs: 1.9 10*3/uL (ref 0.7–4.0)
MCH: 27.8 pg (ref 26.0–34.0)
MCHC: 31.5 g/dL (ref 30.0–36.0)
MCV: 88.1 fL (ref 80.0–100.0)
Monocytes Absolute: 0.6 10*3/uL (ref 0.1–1.0)
Monocytes Relative: 8 %
Neutro Abs: 4.8 10*3/uL (ref 1.7–7.7)
Neutrophils Relative %: 61 %
Platelets: 316 10*3/uL (ref 150–400)
RBC: 3.96 MIL/uL — ABNORMAL LOW (ref 4.22–5.81)
RDW: 14.3 % (ref 11.5–15.5)
WBC: 7.9 10*3/uL (ref 4.0–10.5)
nRBC: 0 % (ref 0.0–0.2)

## 2023-08-28 LAB — COMPREHENSIVE METABOLIC PANEL
ALT: 23 U/L (ref 0–44)
AST: 21 U/L (ref 15–41)
Albumin: 3.3 g/dL — ABNORMAL LOW (ref 3.5–5.0)
Alkaline Phosphatase: 51 U/L (ref 38–126)
Anion gap: 7 (ref 5–15)
BUN: 14 mg/dL (ref 6–20)
CO2: 26 mmol/L (ref 22–32)
Calcium: 8.8 mg/dL — ABNORMAL LOW (ref 8.9–10.3)
Chloride: 106 mmol/L (ref 98–111)
Creatinine, Ser: 0.94 mg/dL (ref 0.61–1.24)
GFR, Estimated: >60 mL/min (ref >60)
Glucose, Bld: 123 mg/dL — ABNORMAL HIGH (ref 70–99)
Potassium: 4.4 mmol/L (ref 3.5–5.1)
Sodium: 139 mmol/L (ref 135–145)
Total Bilirubin: 0.6 mg/dL (ref 0.3–1.2)
Total Protein: 6 g/dL — ABNORMAL LOW (ref 6.5–8.1)

## 2023-08-28 LAB — ETHANOL: Alcohol, Ethyl (B): <10 mg/dL (ref <10)

## 2023-08-28 MED ORDER — ONDANSETRON 4 MG PO TBDP: 4.0000 mg | ORAL_TABLET | Freq: Three times a day (TID) | ORAL | 0 refills | Status: DC | PRN

## 2023-08-28 MED ORDER — HYDROXYZINE HCL 25 MG PO TABS: 25.0000 mg | ORAL_TABLET | Freq: Four times a day (QID) | ORAL | 0 refills | Status: DC

## 2023-08-28 NOTE — ED Notes (Signed)
Pt given opportunity to urinate and give [place] a sample, pt stated he can't urinate at this time.

## 2023-08-28 NOTE — Care Management (Signed)
ED RNCM met with patient to discuss request for detox  at [facility].  Patient reports that he was sent here by Substance abuse rehab program for detox from Meth. Discussed that [facility] does not have a detox program. Patient verbalized that he was aware but he was following the direction provided.  Patient provided contact information for [facility] attempted contact the without any success.  Patient was updated, Patient is now requesting to speak with EDP concerning being out of psych meds for over a week.   Updated Dr. Rubin Payor, and nursing staff.  No further ED TOC needs identified.

## 2023-08-28 NOTE — ED Notes (Signed)
Patient verbalizes understanding of discharge instructions. Opportunity for questioning and answers were provided. Pt discharged from ED. Pt ambulatory to ED waiting room with steady gait.

## 2023-08-28 NOTE — ED Provider Notes (Signed)
[place] EMERGENCY DEPARTMENT AT [facility] Provider Note   CSN: [id number] Arrival date & time: 08/28/23  1501     History  Chief Complaint  Patient presents with   Detox    Cole Henderson is a 47 y.o. male.  HPI Patient presents for methamphetamine detox.  States he has a treatment center arranged for 7 months of treatment but states he needs detox.  States he was told to come in here and that we would know what needed to be done.  Last used at 3 in the morning.  States he has not slept in 5 days and has not eaten in a couple days either.  Does have history of schizophrenia but states that is under control.   Past Medical History:  Diagnosis Date   Drug abuse (HCC)    Hypertension    Schizophrenia (HCC)    Tardive dyskinesia     Home Medications Prior to Admission medications   Medication Sig Start Date End Date Taking? Authorizing Provider  hydrOXYzine (ATARAX) 25 MG tablet Take 1 tablet (25 mg total) by mouth every 6 (six) hours. 08/28/23  Yes Benjiman Core, MD  ondansetron (ZOFRAN-ODT) 4 MG disintegrating tablet Take 1 tablet (4 mg total) by mouth every 8 (eight) hours as needed. 08/28/23  Yes Benjiman Core, MD  amLODipine (NORVASC) 5 MG tablet Take 1 tablet (5 mg total) by mouth daily. 02/01/23 03/03/23  Hughie Closs, MD  chlordiazePOXIDE (LIBRIUM) 25 MG capsule 25 mg PO TID x 1 day, then 25 mg PO BID X 1 day, then 25 mg PO QD X 1 day Patient not taking: Reported on 02/22/2023 02/12/23   Cathren Laine, MD  fluPHENAZine (PROLIXIN) 10 MG tablet Take 1 tablet (10 mg total) by mouth 2 (two) times daily. 02/12/23   Cathren Laine, MD  gabapentin (NEURONTIN) 100 MG capsule Take 2 capsules (200 mg total) by mouth 3 (three) times daily. 09/10/22   Sarita Bottom, MD  sertraline (ZOLOFT) 100 MG tablet Take 1 tablet (100 mg total) by mouth daily. 09/11/22   Sarita Bottom, MD  traZODone (DESYREL) 50 MG tablet Take 1 tablet (50 mg total) by mouth at bedtime as needed for  sleep. 09/10/22   Sarita Bottom, MD  trihexyphenidyl (ARTANE) 5 MG tablet Take 1 tablet (5 mg total) by mouth 3 (three) times daily. Patient taking differently: Take 5 mg by mouth in the morning and at bedtime. 09/10/22   Sarita Bottom, MD      Allergies    Fish allergy, Haloperidol lactate, Shellfish allergy, Cephalexin, Cogentin [benztropine], Ingrezza [valbenazine tosylate], Risperidone and related, Strawberry extract, and Geodon [ziprasidone]    Review of Systems   Review of Systems  Physical Exam Updated Vital Signs BP (!) 159/100 (BP Location: Left Arm)   Pulse 91   Temp 98.5 F (36.9 C) (Oral)   Resp 16   SpO2 100%  Physical Exam Vitals and nursing note reviewed.  HENT:     Mouth/Throat:     Comments: Poor dentition Cardiovascular:     Rate and Rhythm: Regular rhythm.  Pulmonary:     Effort: Pulmonary effort is normal.  Skin:    Capillary Refill: Capillary refill takes less than 2 seconds.  Neurological:     Mental Status: He is alert and oriented to person, place, and time.     ED Results / Procedures / Treatments   Labs (all labs ordered are listed, but only abnormal results are displayed) Labs Reviewed  COMPREHENSIVE METABOLIC  PANEL - Abnormal; Notable for the following components:      Result Value   Glucose, Bld 123 (*)    Calcium 8.8 (*)    Total Protein 6.0 (*)    Albumin 3.3 (*)    All other components within normal limits  CBC WITH DIFFERENTIAL/PLATELET - Abnormal; Notable for the following components:   RBC 3.96 (*)    Hemoglobin 11.0 (*)    HCT 34.9 (*)    All other components within normal limits  ETHANOL  RAPID URINE DRUG SCREEN, HOSP PERFORMED    EKG None  Radiology No results found.  Procedures Procedures    Medications Ordered in ED Medications - No data to display  ED Course/ Medical Decision Making/ A&P                                 Medical Decision Making Risk Prescription drug management.   Patient presented for  "detox".  Blood work reassuring.  Patient is medically clear.  Will have transition of care to see patient for potential resources.  Will feed patient.  Does not appear to need inpatient psychiatric treatment at this time.  Transition care has seen patient.  No inpatient detox at this time.  Given symptomatic treatment.  They have attempted to call placement where he is supposed to go.  Unable to get a hold of them.  Will discharge.        Final Clinical Impression(s) / ED Diagnoses Final diagnoses:  Methamphetamine use (HCC)    Rx / DC Orders ED Discharge Orders          Ordered    ondansetron (ZOFRAN-ODT) 4 MG disintegrating tablet  Every 8 hours PRN        08/28/23 1956    hydrOXYzine (ATARAX) 25 MG tablet  Every 6 hours        08/28/23 1956              Benjiman Core, MD 08/28/23 2334

## 2023-08-28 NOTE — ED Triage Notes (Signed)
Pt is coming in for Detox from Meth, states he has done it for 5 years, He has been awake for 5 days and is coming in for detox due to a 7 month program requiring him to. The program is an unknown name except that it includes the words " teams ". Pt does not express any chest pain/shortness breath/nausea/vomiting. No other complaints or pain at this time and is cooperative in triage.

## 2023-08-31 DIAGNOSIS — F19939 Other psychoactive substance use, unspecified with withdrawal, unspecified: Secondary | ICD-10-CM | POA: Diagnosis not present

## 2023-08-31 DIAGNOSIS — I10 Essential (primary) hypertension: Secondary | ICD-10-CM | POA: Diagnosis not present

## 2023-08-31 DIAGNOSIS — R110 Nausea: Secondary | ICD-10-CM | POA: Diagnosis not present

## 2023-09-10 ENCOUNTER — Ambulatory Visit (HOSPITAL_COMMUNITY)
Admission: EM | Admit: 2023-09-10 | Discharge: 2023-09-10 | Disposition: A | Discharge status: Home / Self Care | Payer: MEDICAID | Attending: Psychiatry | Admitting: Psychiatry

## 2023-09-10 DIAGNOSIS — Z760 Encounter for issue of repeat prescription: Secondary | ICD-10-CM

## 2023-09-10 DIAGNOSIS — Z5900 Homelessness unspecified: Secondary | ICD-10-CM

## 2023-09-10 DIAGNOSIS — F209 Schizophrenia, unspecified: Secondary | ICD-10-CM

## 2023-09-10 NOTE — ED Provider Notes (Signed)
Behavioral Health Urgent Care Medical Screening Exam  Patient Name: Cole Henderson MRN: [id number] Date of Evaluation: 09/11/23 Chief Complaint:  medication refill, and need somewhere to sleep fro the night Diagnosis:  Final diagnoses:  Schizophrenia, unspecified type (HCC)  Encounter for medication refill  Homelessness    History of Present illness: Cole Henderson is a 47 y.o. male. With a history of schizophrenia, EtOH, methamphetamine use presented to present to [facility] voluntarily per the patient he is trying to get his medication refill.  Patient stated that he is on Abilify once a month shot but he missed the dosage when asked who is his provider patient stated he goes to [facility].  Per the patient he has an appointment with Toma Copier tomorrow.  However patient stated that he is currently homeless and he needs somewhere to stay.  Patient stated that he does not have a ride to get to [facility] when asked how he did he get to be helped patient stated his mom dropped him off.  According to patient he just came off probation after serving 15 years in federal prison for bank robbery.  Patient stated that he was in rehab for a couple of weeks for alcohol abuse but stated he has been clean for 2-1/2 weeks.  Patient currently not seeing a psychiatrist.  Face-to-face observation of patient, patient is alert and oriented x 4, speech is clear sometimes stuttered but it does look like patient baseline.  Patient does appear to be anxious. Pt denies SI, HI, or paranoia.  Pt report he hear voice of a TED, according to patient [name] is a democrat and the patient stated that he is a republican so he do not like nothing about Biden.  Per the patient he needs somewhere to stay for the night because he needs to go pick up his Suboxone and his medication at [facility] in the morning.  Writer discussed with patient that he needs to reach out to the men shelter where he can get a bed for the night however its not  possible to admit him just because he needs somewhere to stay.  Patient started cursing stating that we are not giving him the help he want because where is he supposed to go to sleep tonight.  Writer did speak with Dr. Rhae Hammock MD, did explain to Dr. Tyna Jaksch MD le that patient was not suicidal or homicidal and that patient was homeless and looking somewhere to stay for the night however patient does have an appointment at [facility] tomorrow to have his Suboxone and his Abilify refill.  Flowsheet Row ED from 09/10/2023 in [facility] ED from 08/28/2023 in [facility] Emergency Department at [facility] ED from 02/21/2023 [facility]  C-SSRS RISK CATEGORY No Risk No Risk No Risk       Psychiatric Specialty Exam  Presentation  General Appearance:Casual  Eye Contact:Good  Speech:Clear and Coherent  Speech Volume:Normal  Handedness:Right   Mood and Affect  Mood: Anxious  Affect: Labile   Thought Process  Thought Processes: Coherent  Descriptions of Associations:Intact  Orientation:Full (Time, Place and Person)  Thought Content:Logical  Diagnosis of Schizophrenia or Schizoaffective disorder in past: Yes  Duration of Psychotic Symptoms: Greater than six months  Hallucinations:Auditory hear [name] who is a democrate talking to him,  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     Nose: Nose normal.  Eyes:      Pupils: Pupils are equal, round, and reactive to light.  Cardiovascular:     Rate and Rhythm: Tachycardia present.  Pulmonary:     Effort: Pulmonary effort is normal.  Musculoskeletal:        General: Normal range of motion.     Cervical back: Normal range of motion.  Neurological:     General: No focal deficit present.     Mental Status: He is alert.  Psychiatric:        Mood and Affect: Mood normal.        Thought Content: Thought content normal.        Judgment: Judgment normal.    Review of Systems  Constitutional: Negative.   HENT: Negative.    Eyes: Negative.   Respiratory: Negative.    Cardiovascular: Negative.   Gastrointestinal: Negative.   Genitourinary: Negative.   Musculoskeletal: Negative.   Skin: Negative.   Neurological: Negative.   Psychiatric/Behavioral:  Positive for hallucinations, substance abuse and suicidal ideas. The patient is nervous/anxious.    Blood pressure (!) 164/114, pulse (!) 114, temperature 98.5 F (36.9 C), temperature source Oral, resp. rate 18, SpO2 99%. There is no height or weight on file to calculate BMI.  Musculoskeletal: Strength & Muscle Tone: within normal limits Gait & Station: normal Patient leans: N/A   [facility] MSE Discharge Disposition for Follow up and Recommendations: Based on my evaluation the patient does not appear to have an emergency medical condition and can be discharged with resources and follow up care in outpatient services for Medication Management   Sindy Guadeloupe, NP 09/11/2023, 6:00 AM

## 2023-09-10 NOTE — Progress Notes (Signed)
   09/10/23 1923  [facility] Triage Screening (Walk-ins at [facility] only)  How Did You Hear About [place]? Family/Friend (Mother brought hm to [facility])  What Is the Reason for Your Visit/Call Today? Pt says that the reason he is at [facility] "because I need to get back on my psych meds."  He says that he has been over 30 days w/o meds.  He was going to [facility] in [place] 32 days ago.  "I went there to get my shot."  He was getting a Abilify shot every 30 days.  He says he needs to be back on his meds "I don't want to be like this around my grandkids."  He says he has been homeless for 4 years.  He says his wife passed away last Thanksgiving after 29 years of marriage.  He says he has been clean for 2.5 weeks.  Denies any use of anything in the last 24 hours.  He had gone to [facility] in [place] for detox and they sent him to [place] to get off the ETOH.  He is wantng to go inpatient somewhere until he gets on his medications.  He denies any HI at this time.  Pt denies any HI.  Pt has a 'entity named [name] that lives in my head."  He hears this person whispering in his head.  He says that the Abilify does help with the hallucinations.  He says he may talk out loud to "[name]."  He says he did 15 years in prison for robbing a bank.  He got off probation on 08/10/23.  He has no access to guns.  How Long Has This Been Causing You Problems? <Week  Have You Recently Had Any Thoughts About Hurting Yourself? No  Are You Planning to Commit Suicide/Harm Yourself At This time? No  Have you Recently Had Thoughts About Hurting Someone [name]? No  Are You Planning To Harm Someone At This Time? No  Are you currently experiencing any auditory, visual or other hallucinations? Yes  Please explain the hallucinations you are currently experiencing: Auditory hallucinations of that talks about politics.  He will see :"[name]" which is the person that talks to him in his head.  Have You Used Any Alcohol or Drugs in the Past 24 Hours? No  Do you have  any current medical co-morbidities that require immediate attention? No  Clinician description of patient physical appearance/behavior: Pt is casually dressed, has black jeans with paint splotches on them.  Pt is friendly and talkative.  He has good eye contact and is oriented x4.  What Do You Feel Would Help You the Most Today? Medication(s)  If access to [facility] was not available, would you have sought care in the Emergency Department? No  Determination of Need Routine (7 days)  Options For Referral Outpatient Therapy;Medication Management

## 2023-09-10 NOTE — Discharge Instructions (Signed)
F/u with Cole Henderson medication for montly injection abilify F/u with walkin clinic

## 2023-09-10 NOTE — ED Notes (Signed)
Pt was verbally aggressive to Clinical research associate and security he refused AVS and OP services he said that will not help him he is homeless and need a place to stay he was verbally using vulgar words towards NP Channing Mutters NP Securat walked him out and gave him his belongings in the orange locker

## 2023-09-11 ENCOUNTER — Other Ambulatory Visit: Payer: Self-pay

## 2023-09-11 ENCOUNTER — Encounter (HOSPITAL_COMMUNITY): Payer: Self-pay

## 2023-09-11 ENCOUNTER — Emergency Department (HOSPITAL_COMMUNITY)
Admission: EM | Admit: 2023-09-11 | Discharge: 2023-09-11 | Discharge status: Against Medical Advice | Payer: MEDICAID | Attending: Emergency Medicine | Admitting: Emergency Medicine

## 2023-09-11 DIAGNOSIS — Z5900 Homelessness unspecified: Secondary | ICD-10-CM | POA: Insufficient documentation

## 2023-09-11 DIAGNOSIS — Z5321 Procedure and treatment not carried out due to patient leaving prior to being seen by health care provider: Secondary | ICD-10-CM | POA: Diagnosis not present

## 2023-09-11 DIAGNOSIS — Z760 Encounter for issue of repeat prescription: Secondary | ICD-10-CM | POA: Insufficient documentation

## 2023-09-11 NOTE — ED Notes (Signed)
Pt stated he did not want to wait this long for his shot and will be back tomorrow. Pt moved OTF.

## 2023-09-11 NOTE — ED Triage Notes (Signed)
Pt complaining of needing his abilify shot. [place] Sp[place]iel he is homeless and does not remember when his last one was.

## 2023-10-01 DIAGNOSIS — F209 Schizophrenia, unspecified: Secondary | ICD-10-CM | POA: Diagnosis not present

## 2023-10-02 DIAGNOSIS — F209 Schizophrenia, unspecified: Secondary | ICD-10-CM | POA: Diagnosis not present

## 2023-10-22 ENCOUNTER — Ambulatory Visit (HOSPITAL_COMMUNITY)
Admission: EM | Admit: 2023-10-22 | Discharge: 2023-10-23 | Disposition: A | Discharge status: Home / Self Care | Payer: MEDICAID | Attending: Psychiatry | Admitting: Psychiatry

## 2023-10-22 ENCOUNTER — Other Ambulatory Visit: Payer: Self-pay

## 2023-10-22 DIAGNOSIS — F209 Schizophrenia, unspecified: Secondary | ICD-10-CM | POA: Insufficient documentation

## 2023-10-22 DIAGNOSIS — Z91148 Patient's other noncompliance with medication regimen for other reason: Secondary | ICD-10-CM | POA: Diagnosis not present

## 2023-10-22 DIAGNOSIS — F19951 Other psychoactive substance use, unspecified with psychoactive substance-induced psychotic disorder with hallucinations: Secondary | ICD-10-CM | POA: Insufficient documentation

## 2023-10-22 LAB — CBC WITH DIFFERENTIAL/PLATELET
Abs Immature Granulocytes: 0.03 10*3/uL (ref 0.00–0.07)
Basophils Absolute: 0.1 10*3/uL (ref 0.0–0.1)
Basophils Relative: 1 %
Eosinophils Absolute: 0.5 10*3/uL (ref 0.0–0.5)
Eosinophils Relative: 6 %
HCT: 37 % — ABNORMAL LOW (ref 39.0–52.0)
Hemoglobin: 12.3 g/dL — ABNORMAL LOW (ref 13.0–17.0)
Immature Granulocytes: 0 %
Lymphocytes Relative: 29 %
Lymphs Abs: 2.4 10*3/uL (ref 0.7–4.0)
MCH: 28.7 pg (ref 26.0–34.0)
MCHC: 33.2 g/dL (ref 30.0–36.0)
MCV: 86.4 fL (ref 80.0–100.0)
Monocytes Absolute: 0.6 10*3/uL (ref 0.1–1.0)
Monocytes Relative: 7 %
Neutro Abs: 4.7 10*3/uL (ref 1.7–7.7)
Neutrophils Relative %: 57 %
Platelets: 311 10*3/uL (ref 150–400)
RBC: 4.28 MIL/uL (ref 4.22–5.81)
RDW: 14.2 % (ref 11.5–15.5)
WBC: 8.2 10*3/uL (ref 4.0–10.5)
nRBC: 0 % (ref 0.0–0.2)

## 2023-10-22 LAB — POCT URINE DRUG SCREEN - MANUAL ENTRY (I-SCREEN)
POC Amphetamine UR: POSITIVE — AB
POC Buprenorphine (BUP): NOT DETECTED
POC Cocaine UR: NOT DETECTED
POC Marijuana UR: NOT DETECTED
POC Methadone UR: NOT DETECTED
POC Methamphetamine UR: POSITIVE — AB
POC Morphine: NOT DETECTED
POC Oxazepam (BZO): NOT DETECTED
POC Oxycodone UR: NOT DETECTED
POC Secobarbital (BAR): NOT DETECTED

## 2023-10-22 LAB — COMPREHENSIVE METABOLIC PANEL
ALT: 25 U/L (ref 0–44)
AST: 20 U/L (ref 15–41)
Albumin: 4.1 g/dL (ref 3.5–5.0)
Alkaline Phosphatase: 52 U/L (ref 38–126)
Anion gap: 8 (ref 5–15)
BUN: 13 mg/dL (ref 6–20)
CO2: 26 mmol/L (ref 22–32)
Calcium: 9.4 mg/dL (ref 8.9–10.3)
Chloride: 103 mmol/L (ref 98–111)
Creatinine, Ser: 0.74 mg/dL (ref 0.61–1.24)
GFR, Estimated: >60 mL/min (ref >60)
Glucose, Bld: 92 mg/dL (ref 70–99)
Potassium: 4.4 mmol/L (ref 3.5–5.1)
Sodium: 137 mmol/L (ref 135–145)
Total Bilirubin: 0.8 mg/dL (ref <1.2)
Total Protein: 6.6 g/dL (ref 6.5–8.1)

## 2023-10-22 LAB — HEMOGLOBIN A1C
Hgb A1c MFr Bld: 6 % — ABNORMAL HIGH (ref 4.8–5.6)
Mean Plasma Glucose: 125.5 mg/dL

## 2023-10-22 LAB — TSH: TSH: 1.156 u[IU]/mL (ref 0.350–4.500)

## 2023-10-22 MED ORDER — HYDROXYZINE HCL 25 MG PO TABS
25.0000 mg | ORAL_TABLET | Freq: Three times a day (TID) | ORAL | Status: DC | PRN
  Administered 2023-10-22 – 2023-10-23 (×2): 25 mg via ORAL
  Filled 2023-10-22 (×2): qty 1

## 2023-10-22 MED ORDER — NICOTINE 14 MG/24HR TD PT24: 14.0000 mg | MEDICATED_PATCH | Freq: Every day | TRANSDERMAL | 0 refills | Status: DC | PRN

## 2023-10-22 MED ORDER — ARIPIPRAZOLE 5 MG PO TABS
5.0000 mg | ORAL_TABLET | Freq: Every day | ORAL | Status: DC
  Administered 2023-10-22: 5 mg via ORAL
  Filled 2023-10-22: qty 1

## 2023-10-22 MED ORDER — DIPHENHYDRAMINE HCL 25 MG PO CAPS: 25.0000 mg | ORAL_CAPSULE | Freq: Four times a day (QID) | ORAL | Status: DC | PRN

## 2023-10-22 MED ORDER — LORAZEPAM 2 MG/ML IJ SOLN: 1.0000 mg | Freq: Four times a day (QID) | INTRAMUSCULAR | Status: DC | PRN

## 2023-10-22 MED ORDER — OLANZAPINE 5 MG PO TBDP: 5.0000 mg | ORAL_TABLET | Freq: Four times a day (QID) | ORAL | Status: DC | PRN

## 2023-10-22 MED ORDER — ALUM & MAG HYDROXIDE-SIMETH 200-200-20 MG/5ML PO SUSP: 30.0000 mL | ORAL | Status: DC | PRN

## 2023-10-22 MED ORDER — HALOPERIDOL LACTATE 5 MG/ML IJ SOLN: 5.0000 mg | Freq: Four times a day (QID) | INTRAMUSCULAR | Status: DC | PRN

## 2023-10-22 MED ORDER — SERTRALINE HCL 50 MG PO TABS
50.0000 mg | ORAL_TABLET | Freq: Every day | ORAL | Status: DC
  Administered 2023-10-22 – 2023-10-23 (×2): 50 mg via ORAL
  Filled 2023-10-22 (×2): qty 1

## 2023-10-22 MED ORDER — DIPHENHYDRAMINE HCL 50 MG/ML IJ SOLN
25.0000 mg | Freq: Four times a day (QID) | INTRAMUSCULAR | Status: DC | PRN
Start: 2023-10-22 — End: 2023-10-23

## 2023-10-22 MED ORDER — BISMUTH SUBSALICYLATE 262 MG PO CHEW: 524.0000 mg | CHEWABLE_TABLET | ORAL | Status: DC | PRN

## 2023-10-22 MED ORDER — NICOTINE 14 MG/24HR TD PT24: 14.0000 mg | MEDICATED_PATCH | Freq: Every day | TRANSDERMAL | Status: DC | PRN

## 2023-10-22 MED ORDER — DIPHENHYDRAMINE HCL 50 MG/ML IJ SOLN: 25.0000 mg | Freq: Four times a day (QID) | INTRAMUSCULAR | Status: DC | PRN

## 2023-10-22 MED ORDER — NICOTINE POLACRILEX 2 MG MT GUM: 2.0000 mg | CHEWING_GUM | OROMUCOSAL | 0 refills | Status: DC | PRN

## 2023-10-22 MED ORDER — TRIHEXYPHENIDYL HCL 5 MG PO TABS
5.0000 mg | ORAL_TABLET | Freq: Three times a day (TID) | ORAL | Status: DC
  Administered 2023-10-22 – 2023-10-23 (×4): 5 mg via ORAL
  Filled 2023-10-22 (×4): qty 1

## 2023-10-22 MED ORDER — POLYETHYLENE GLYCOL 3350 17 G PO PACK: 17.0000 g | PACK | Freq: Every day | ORAL | Status: DC | PRN

## 2023-10-22 MED ORDER — LORAZEPAM 1 MG PO TABS: 1.0000 mg | ORAL_TABLET | Freq: Four times a day (QID) | ORAL | Status: DC | PRN

## 2023-10-22 MED ORDER — TRAZODONE HCL 150 MG PO TABS: 150.0000 mg | ORAL_TABLET | Freq: Every day | ORAL | Status: DC

## 2023-10-22 MED ORDER — TRAZODONE HCL 150 MG PO TABS
150.0000 mg | ORAL_TABLET | Freq: Every day | ORAL | Status: DC
  Administered 2023-10-22: 150 mg via ORAL
  Filled 2023-10-22: qty 1

## 2023-10-22 MED ORDER — HALOPERIDOL 5 MG PO TABS: 5.0000 mg | ORAL_TABLET | Freq: Four times a day (QID) | ORAL | Status: DC | PRN

## 2023-10-22 MED ORDER — SERTRALINE HCL 50 MG PO TABS: 50.0000 mg | ORAL_TABLET | Freq: Every day | ORAL | 0 refills | Status: DC

## 2023-10-22 MED ORDER — ACETAMINOPHEN 325 MG PO TABS
650.0000 mg | ORAL_TABLET | Freq: Four times a day (QID) | ORAL | Status: DC | PRN
  Administered 2023-10-23: 650 mg via ORAL
  Filled 2023-10-22: qty 2

## 2023-10-22 MED ORDER — SENNA 8.6 MG PO TABS: 1.0000 | ORAL_TABLET | Freq: Every evening | ORAL | Status: DC | PRN

## 2023-10-22 MED ORDER — TRAZODONE HCL 150 MG PO TABS: 150.0000 mg | ORAL_TABLET | Freq: Every day | ORAL | 0 refills | Status: DC

## 2023-10-22 MED ORDER — ONDANSETRON HCL 4 MG PO TABS: 8.0000 mg | ORAL_TABLET | Freq: Three times a day (TID) | ORAL | Status: DC | PRN

## 2023-10-22 MED ORDER — NICOTINE POLACRILEX 2 MG MT GUM: 2.0000 mg | CHEWING_GUM | OROMUCOSAL | Status: DC | PRN
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
## 2023-10-22 NOTE — ED Notes (Signed)
Pt here voluntarily.Will be discharged in the morning. Pt denies SI, HI, AVH and pain. Pt endorses anxiety but denies depression. "I just want to get some sleep." Pt calm and cooperative.

## 2023-10-22 NOTE — ED Provider Notes (Signed)
FBC/OBS ASAP Discharge Summary  Date: 10/22/23 Name: Cole Henderson MRN: [id number]  Discharge Diagnoses:  Final diagnoses:  Substance-induced psychotic disorder with hallucinations (HCC)   Cole Henderson is a 47 y.o. 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 He denies denies having suicidal or homicidal thoughts.  Stay Summary: During the patient's hospitalization, patient had extensive initial psychiatric evaluation, and follow-up psychiatric evaluations every day.  The following meds were provided and managed during his stay. Scheduled Meds:  ARIPiprazole  5 mg Oral QHS   sertraline  50 mg Oral Daily   traZODone  150 mg Oral QHS   trihexyphenidyl  5 mg Oral TID   Continuous Infusions: PRN Meds:.acetaminophen, alum & mag hydroxide-simeth, bismuth subsalicylate, haloperidol **AND** LORazepam **AND** diphenhydrAMINE, haloperidol lactate **AND** LORazepam **AND** diphenhydrAMINE, hydrOXYzine, nicotine, nicotine polacrilex, ondansetron, polyethylene glycol, senna  Patient's care was discussed during the interdisciplinary team meeting every day during the hospitalization.  The patient denies any side effects to prescribed psychiatric medication.  Gradually, patient started adjusting to milieu. The patient was evaluated each day by a clinical provider to ascertain response to treatment. Improvement was noted by the patient's report of  decreasing symptoms, improved sleep and appetite, affect, medication tolerance, behavior, and participation in unit programming.  Patient was asked each day to complete a self inventory noting mood, mental status, pain, new symptoms, anxiety and concerns.   Symptoms were reported as significantly decreased or resolved completely by discharge.  The patient reports that their mood is stable.  The patient denied having suicidal thoughts for more than 48 hours prior to discharge.  Patient denies having homicidal thoughts.  Patient denies having auditory hallucinations.  Patient denies any visual hallucinations or other symptoms of psychosis.  The patient was motivated to continue taking medication with a goal of continued improvement in mental health.   Symptoms were reported as significantly decreased or resolved completely by discharge.   On day of discharge, the patient reports that their mood is stable. The patient denied having suicidal thoughts for more than 48 hours prior to discharge.  Patient denies having homicidal thoughts.  Patient denies having auditory hallucinations.  Patient denies any visual hallucinations or other symptoms of psychosis. The patient was motivated to continue taking medication with a goal of continued improvement in mental health.   The patient reports their target psychiatric symptoms of hallucinations and paranoid thoughts responded well to the psychiatric medications, and the patient reports overall benefit from psychiatric hospitalization. Supportive psychotherapy was provided to the patient. The patient also participated in regular group therapy while hospitalized. Coping skills, problem solving as well as relaxation therapies were also part of the unit programming.  Labs were reviewed with the patient, and abnormal results were discussed with the patient.  The patient is able to verbalize their individual safety plan to this provider.  # It is recommended to the  patient to continue psychiatric medications as prescribed, after discharge from the hospital.    # It is recommended  to the patient to follow up with your outpatient psychiatric provider and PCP.  # It was discussed with the patient, the impact of alcohol, drugs, tobacco have been there overall psychiatric and medical wellbeing, and total abstinence from substance use was recommended.  # Prescriptions provided or sent directly to preferred pharmacy at discharge. Patient agreeable to plan. Given opportunity to ask questions. Appears to feel comfortable with discharge.    # In the event of worsening symptoms, the patient is instructed to call the crisis hotline, 911 and or go to the nearest ED for appropriate evaluation and treatment of symptoms. To follow-up with primary care provider for other medical issues, concerns and or health care needs  # Patient was discharged to the community with plans to follow up provided in the discharge instructions.  On day of discharge patient is not suicidal or homicidal. He is not exhibiting bizarre behavior nor endorsing auditory or visual hallucinations. He is not exhibiting any life-threatening substance withdrawal symptoms. At present, there are no indications to hold patient involuntarily.  Total Time spent with patient: 45 minutes  Past Psychiatric History: schizophrenia, stimulant use disorder, alcohol use disorder    Past Medical History: none   Family History: none   Social History: denies any drug use Tobacco Cessation:  A prescription for an FDA-approved tobacco cessation medication provided at discharge  Current Medications:  Current Facility-Administered Medications  Medication Dose Route Frequency Provider Last Rate Last Admin   acetaminophen (TYLENOL) tablet 650 mg  650 mg Oral Q6H PRN Augusto Gamble, MD       alum & mag hydroxide-simeth (MAALOX/MYLANTA) 200-200-20 MG/5ML suspension 30 mL  30 mL Oral Q4H PRN Augusto Gamble, MD        ARIPiprazole (ABILIFY) tablet 5 mg  5 mg Oral QHS Augusto Gamble, MD       bismuth subsalicylate (PEPTO BISMOL) chewable tablet 524 mg  524 mg Oral Q3H PRN Augusto Gamble, MD       haloperidol (HALDOL) tablet 5 mg  5 mg Oral Q6H PRN Augusto Gamble, MD       And   LORazepam (ATIVAN) tablet 1 mg  1 mg Oral Q6H PRN Augusto Gamble, MD       And   diphenhydrAMINE (BENADRYL) capsule 25 mg  25 mg Oral Q6H PRN Augusto Gamble, MD       haloperidol lactate (HALDOL) injection 5 mg  5 mg Intramuscular Q6H PRN Augusto Gamble, MD       And   LORazepam (ATIVAN) injection 1 mg  1 mg Intramuscular Q6H PRN Augusto Gamble, MD       And   diphenhydrAMINE (BENADRYL) injection 25 mg  25 mg Intramuscular Q6H PRN Augusto Gamble, MD       hydrOXYzine (ATARAX) tablet 25 mg  25 mg Oral TID PRN Augusto Gamble, MD       nicotine (NICODERM CQ - dosed in mg/24 hours) patch 14 mg  14 mg Transdermal Daily PRN Augusto Gamble, MD       nicotine polacrilex (NICORETTE) gum 2 mg  2 mg Oral PRN Augusto Gamble, MD       ondansetron [facility]) tablet 8 mg  8 mg Oral Q8H PRN Augusto Gamble, MD       polyethylene glycol (MIRALAX / GLYCOLAX) packet 17 g  17 g Oral Daily PRN Augusto Gamble, MD       senna (SENOKOT) tablet 8.6 mg  1 tablet Oral QHS PRN Augusto Gamble, MD  sertraline (ZOLOFT) tablet 50 mg  50 mg Oral Daily Augusto Gamble, MD   50 mg at 10/22/23 1155   traZODone (DESYREL) tablet 150 mg  150 mg Oral QHS Augusto Gamble, MD       trihexyphenidyl (ARTANE) tablet 5 mg  5 mg Oral TID Augusto Gamble, MD   5 mg at 10/22/23 1155   Current Outpatient Medications  Medication Sig Dispense Refill   amphetamine-dextroamphetamine (ADDERALL) 10 MG tablet Take 30 mg by mouth 2 (two) times daily.     ARIPiprazole ER (ABILIFY MAINTENA) 400 MG SRER injection Inject 400 mg into the muscle every 28 (twenty-eight) days.     aspirin EC 81 MG tablet Take 1 tablet by mouth daily.     buprenorphine (SUBUTEX) 8 MG SUBL SL tablet Place 8 mg under the tongue 3 (three) times daily.      hydrOXYzine (ATARAX) 25 MG tablet Take 1 tablet (25 mg total) by mouth every 6 (six) hours. 12 tablet 0   lisinopril (ZESTRIL) 40 MG tablet Take 1 tablet by mouth daily.     meloxicam (MOBIC) 15 MG tablet Take 15 mg by mouth daily.     pantoprazole (PROTONIX) 40 MG tablet Take 40 mg by mouth daily.     trihexyphenidyl (ARTANE) 5 MG tablet Take 1 tablet (5 mg total) by mouth 3 (three) times daily. (Patient taking differently: Take 5 mg by mouth in the morning and at bedtime.) 90 tablet 0   amLODipine (NORVASC) 5 MG tablet Take 1 tablet (5 mg total) by mouth daily. 30 tablet 0   nicotine (NICODERM CQ - DOSED IN MG/24 HOURS) 14 mg/24hr patch Place 1 patch (14 mg total) onto the skin daily as needed (nicotine cravings). 28 patch 0   nicotine polacrilex (NICORETTE) 2 MG gum Take 1 each (2 mg total) by mouth as needed for smoking cessation. 100 tablet 0   [START ON 10/23/2023] sertraline (ZOLOFT) 50 MG tablet Take 1 tablet (50 mg total) by mouth daily for 15 days. 15 tablet 0   traZODone (DESYREL) 150 MG tablet Take 1 tablet (150 mg total) by mouth at bedtime for 15 days. 15 tablet 0    PTA Medications: (Not in a hospital admission)      12/01/2020    9:13 AM 08/28/2020    7:59 PM  Depression screen PHQ 2/9  Decreased Interest 1 0  Down, Depressed, Hopeless 1 0  PHQ - 2 Score 2 0  Altered sleeping 3 3  Tired, decreased energy 3 0  Change in appetite 3 3  Feeling bad or failure about yourself  3 0  Trouble concentrating 3 1  Moving slowly or fidgety/restless 2 2  Suicidal thoughts 0 0  PHQ-9 Score 19 9  Difficult doing work/chores Very difficult Somewhat difficult    Flowsheet Row ED from 10/22/2023 in [facility] ED from 09/11/2023 in [facility] Emergency Department at [facility] ED from 09/10/2023 in [facility]  C-SSRS RISK CATEGORY No Risk No Risk No Risk       Musculoskeletal  Strength & Muscle Tone: within  normal limits Gait & Station: normal Patient leans: N/A  Psychiatric Specialty Exam  Presentation General Appearance: Disheveled   Eye Contact:Good   Speech:Slurred; Garbled   Speech Volume:Normal   Handedness:Right   Mood and Affect  Mood:-- ("I need my meds")   Affect:Appropriate; Congruent; Full Range   Thought Process  Thought Processes:Coherent; Linear; Goal Directed  Descriptions of Associations:Intact   Orientation:Full (Time, Place and Person)   Thought Content:Logical; WDL   Diagnosis of Schizophrenia or Schizoaffective disorder in past: Yes    Hallucinations:Hallucinations: Auditory Description of Auditory Hallucinations: see H&P   Ideas of Reference:None   Suicidal Thoughts:Suicidal Thoughts: No   Homicidal Thoughts:Homicidal Thoughts: No   Sensorium  Memory:Immediate Good; Recent Good; Remote Good   Judgment:Poor   Insight:Lacking   Executive Functions  Concentration:Good   Attention Span:Good   Recall:Good   Fund of Knowledge:Good   Language:Poor   Psychomotor Activity  Psychomotor Activity:Psychomotor Activity: Normal   Assets  Assets:Resilience   Sleep  Sleep:Sleep: Poor   Nutritional Assessment (For OBS and FBC admissions only) Has the patient had a weight loss or gain of 10 pounds or more in the last 3 months?: No Has the patient had a decrease in food intake/or appetite?: No Does the patient have dental problems?: No Does the patient have eating habits or behaviors that may be indicators of an eating disorder including binging or inducing vomiting?: No Has the patient recently lost weight without trying?: 0 Has the patient been eating poorly because of a decreased appetite?: 0 Malnutrition Screening Tool Score: 0    Physical Exam  Physical Exam Vitals and nursing note reviewed.  HENT:     Head: Normocephalic and atraumatic.  Pulmonary:     Effort: Pulmonary effort is normal.   Musculoskeletal:     Cervical back: Normal range of motion.  Neurological:     General: No focal deficit present.     Mental Status: He is alert.    Review of Systems  Constitutional: Negative.   Respiratory: Negative.    Cardiovascular: Negative.   Gastrointestinal: Negative.   Genitourinary: Negative.   Psychiatric/Behavioral:         Psychiatric subjective data addressed in PSE or HPI / daily subjective report   Blood pressure (!) 150/99, pulse 100, temperature 98.9 F (37.2 C), temperature source Oral, resp. rate 19, SpO2 100%. There is no height or weight on file to calculate BMI.  Demographic Factors:  Male and Caucasian  Loss Factors: NA  Historical Factors: NA  Risk Reduction Factors:   Living with another person, especially a relative and Positive social support  Continued Clinical Symptoms:  Alcohol/Substance Abuse/Dependencies More than one psychiatric diagnosis  Cognitive Features That Contribute To Risk:  None    Suicide Risk:  Mild:  Suicidal ideation of limited frequency, intensity, duration, and specificity.  There are no identifiable plans, no associated intent, mild dysphoria and related symptoms, good self-control (both objective and subjective assessment), few other risk factors, and identifiable protective factors, including available and accessible social support.  Plan Of Care/Follow-up recommendations:  Activity: as tolerated  Diet: heart healthy  Other: -Follow-up with your outpatient psychiatric provider -instructions on appointment date, time, and address (location) are provided to you in discharge paperwork.  -Take your psychiatric medications as prescribed at discharge - instructions are provided to you in the discharge paperwork  -Follow-up with outpatient primary care doctor and other specialists -for management of chronic medical disease, including: health maintenance checks and prediabetes  -Testing: Follow-up with outpatient  provider for abnormal lab results: elevated A1c  -Recommend abstinence from alcohol, tobacco, and other illicit drug use at discharge.   -If your psychiatric symptoms recur, worsen, or if you have side effects to your psychiatric medications, call your outpatient psychiatric provider, 911, [phone] or go to the nearest emergency department.  -If suicidal thoughts recur,  call your outpatient psychiatric provider, 911, [phone] or go to the nearest emergency department.  Disposition: home / self-care with outpatient resources provided  Augusto Gamble, MD 10/22/2023, 3:41 PM

## 2023-10-22 NOTE — Progress Notes (Signed)
   10/22/23 1008  [facility] Triage Screening (Walk-ins at [facility] only)  How Did You Hear About [place]? Family/Friend  What Is the Reason for Your Visit/Call Today? Cole Henderson is a 47 year old male presenting to [name] accompanied by his daugther. Pt is diagnosed with Schizophrenia and depression and anxiety. Pt was discharged from [facility] 2 weeks ago and reports that he hasnt had his medication since then. Pt reports he has not has his Abilify in a month in 2 days. Pt is looking for a medication refill (injection). Pt endorses auditory and visual hallucinations at night time. Pt reports that he hears his "dead wife" telling him things. Pt reports that has been up for 4 days and hasnt eaten in 3 days. Pt reports inconsistent sleep patterns, irritable mood, and lack of an appetite. 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 Pt is requesting medication management and a refill for todays visit. Pt denies SI, HI and AVH at this time.  How Long Has This Been Causing You Problems? 1 wk - 1 month  Have You Recently Had Any Thoughts About Hurting Yourself? No  Are You Planning to Commit Suicide/Harm Yourself At This time? No  Have you Recently Had Thoughts About Hurting Someone [name]? No  Are You Planning To Harm Someone At This Time? No  Are you currently experiencing any auditory, visual or other hallucinations? Yes  Please explain the hallucinations you are currently experiencing: "I hear my dead wife telling me things"  Have You Used Any Alcohol or Drugs in the Past 24 Hours? No  Do you have any current medical co-morbidities that require immediate attention? No  Clinician description of patient physical appearance/behavior: hyper, anxious,  What Do You Feel Would Help You the Most Today? Medication(s)  If access to [facility] was not available, would you have sought care in the Emergency Department? No  Determination of Need  Routine (7 days)  Options For Referral Medication Management

## 2023-10-22 NOTE — Discharge Instructions (Signed)
Dear Cole Henderson,  It was a pleasure to take care of you during your stay at [facility]) where you were treated for your <principal problem not specified>.  While you were here, you were:  observed and cared for by our nurses and nursing assistants  treated with medications by your psychiatrists  evaluated with imaging / lab tests, and treated with medicines / procedures by your doctors  provided resources by our social workers and case managers  Please review the medication list provided to you at discharge and stop, start taking, or continue taking the medications listed there.  You should also follow-up with your primary care doctor, or start seeing one if you don't have one yet. If applicable, here are some scheduled follow-ups for you:    I recommend abstinence from alcohol, tobacco, and other illicit drug use.   If your psychiatric symptoms or suicidal thoughts recur, worsen, or if you have side effects to your psychiatric medications, call your outpatient psychiatric provider, 911, [phone] or go to the nearest emergency department.  Take care!  Signed: Augusto Gamble, MD 10/22/2023, 3:39 PM  Naloxone (Narcan) can help reverse an overdose when given to the victim quickly.  [place] offers free naloxone kits and instructions/training on its use.  Add naloxone to your first aid kit and you can help save a life. A prescription can be filled at your local pharmacy or free kits are provided by the county.  Pick up your free kit at the following locations:   [place]:  [facility], [address] [place] [address] [phone]) [facility] [address] [place] [id number] [phone]) [facility] [address] [place] [address]  [place]: [facility] [address] [place] [address]  ([phone]) [facility] [address] [place] [address] [phone])

## 2023-10-22 NOTE — ED Notes (Signed)
Pt laying in bed at this hour. No apparent distress. RR even and unlabored. Monitored for safety.

## 2023-10-22 NOTE — ED Provider Notes (Signed)
[facility] Continuous Assessment Admission H&P  Date: 10/22/23 Patient Name: Cole Henderson MRN: [id number] Chief Complaint: "I need my meds"  Diagnoses:  Final diagnoses:  Substance-induced psychotic disorder with hallucinations (HCC)    HPI: Cole Henderson is a 47 y.o. 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  He reports that after he was discharged from [facility], he was only given a 2-week supply of medications and a 2-week Abilify injection, but was not provided any follow-up with outpatient services.  He says he has been without medications for the past several weeks.  In the past 4 days, he reports not sleeping and hearing voices telling him to hurt himself.  This resulted in patient self harming by burning his skin.  On further asked what other commands the voices tell him to do, he says, "what DON'T they tell me to do?". He also reports that he became really paranoid and started barricading himself in his room in the house.  His daughter, who accompanied patient to the visit, confirmed the patient's story and says that she brought him here because she was worried about his erratic behaviors.  Patient says that he would like to consolidate his care and go upstairs to the walk-in clinic to schedule appointments and have one place where he can receive medications.  He was amenable to restarting some of his home  meds and being admitted for overnight observation.  Patient reportedly lives with his grandmother.  Total Time spent with patient: 1 hour  Musculoskeletal  Strength & Muscle Tone: within normal limits Gait & Station: normal Patient leans: N/A  Psychiatric Specialty Exam  Presentation General Appearance: Disheveled   Eye Contact:Good   Speech:Slurred; Garbled   Speech Volume:Normal   Handedness: not assessed  Mood and Affect  Mood:-- ("I need my meds")   Affect:Appropriate; Congruent; Full Range   Thought Process  Thought Processes:Coherent; Linear; Goal Directed   Descriptions of Associations:Intact   Orientation:Full (Time, Place and Person)   Thought Content:Logical; WDL  Diagnosis of Schizophrenia or Schizoaffective disorder in past: Yes    Hallucinations:Hallucinations: Auditory Description of Auditory Hallucinations: see H&P   Ideas of Reference:None   Suicidal Thoughts:Suicidal Thoughts: No   Homicidal Thoughts:Homicidal Thoughts: No   Sensorium  Memory:Immediate Good; Recent Good; Remote Good   Judgment:Poor   Insight:Lacking   Executive Functions  Concentration:Good   Attention Span:Good   Recall:Good   Fund of Knowledge:Good   Language:Poor   Psychomotor Activity  Psychomotor Activity:Psychomotor Activity: Normal   Assets  Assets:Resilience   Sleep  Sleep:Sleep: Poor   Nutritional Assessment (For OBS and FBC admissions only) Has the patient had a weight loss or gain of 10 pounds or more in the last 3 months?: No Has the patient had a decrease in food intake/or appetite?: No Does the patient have dental problems?: No Does the patient have eating habits or behaviors that may be indicators of an eating disorder including binging or inducing  vomiting?: No Has the patient recently lost weight without trying?: 0 Has the patient been eating poorly because of a decreased appetite?: 0 Malnutrition Screening  Tool Score: 0    Physical Exam Vitals and nursing note reviewed.  HENT:     Head: Normocephalic and atraumatic.  Pulmonary:     Effort: Pulmonary effort is normal.  Musculoskeletal:     Cervical back: Normal range of motion.  Neurological:     General: No focal deficit present.     Mental Status: He is alert.    Review of Systems  Constitutional: Negative.   Respiratory: Negative.    Cardiovascular: Negative.   Gastrointestinal: Negative.   Genitourinary: Negative.   Psychiatric/Behavioral:         Psychiatric subjective data addressed in PSE or HPI / daily subjective report   Blood pressure (!) 150/99, pulse 100, temperature 98.9 F (37.2 C), temperature source Oral, resp. rate 19, SpO2 100%. There is no height or weight on file to calculate BMI.  Past Psychiatric History: schizophrenia, stimulant use disorder, alcohol use disorder   Is the patient at risk to self? No Has the patient been a risk to self in the past 6 months? No Has the patient been a risk to self within the distant past? No Is the patient a risk to others? No Has the patient been a risk to others in the past 6 months? No Has the patient been a risk to others within the distant past? No  Past Medical History: none  Family History: none  Social History: denies any drug use  Last Labs:     Latest Ref Rng & Units 10/22/2023   12:50 PM 08/28/2023    3:29 PM 02/21/2023    4:19 PM  CMP  Glucose 70 - 99 mg/dL 92  324  401   BUN 6 - 20 mg/dL 13  14  17    Creatinine 0.61 - 1.24 mg/dL 0.27  2.53  6.64   Sodium 135 - 145 mmol/L 137  139  137   Potassium 3.5 - 5.1 mmol/L 4.4  4.4  4.1   Chloride 98 - 111 mmol/L 103  106  99   CO2 22 - 32 mmol/L 26  26  25    Calcium 8.9 - 10.3 mg/dL 9.4  8.8  9.4   Total Protein 6.5 - 8.1 g/dL 6.6  6.0  6.7   Total Bilirubin <1.2 mg/dL 0.8  0.6  0.5   Alkaline Phos 38 - 126 U/L 52  51  56   AST 15 - 41 U/L 20  21  19    ALT 0 - 44 U/L 25  23  15    CBC    Component  Value Date/Time   WBC 8.2 10/22/2023 1250   RBC 4.28 10/22/2023 1250   HGB 12.3 (L) 10/22/2023 1250   HCT 37.0 (L) 10/22/2023 1250   PLT 311 10/22/2023 1250   MCV 86.4 10/22/2023 1250   MCH 28.7 10/22/2023 1250   MCHC 33.2 10/22/2023 1250   RDW 14.2 10/22/2023 1250   LYMPHSABS 2.4 10/22/2023 1250   MONOABS 0.6 10/22/2023 1250   EOSABS 0.5 10/22/2023 1250   BASOSABS 0.1 10/22/2023 1250    EKG monitoring: QTc: pending  Allergies: Fish allergy, Haloperidol lactate, Shellfish allergy, Cephalexin, Cogentin [benztropine], Ingrezza [valbenazine tosylate], Risperidone and related, Strawberry extract, and Geodon [ziprasidone]  PTA Medications: (Not in a hospital admission)   Medical Decision Making   -- restart home sertraline 50 mg (last  dose was 100 mg twice daily 2 months ago) daily for depressive symptoms  -- restart home aripiprazole 5 mg (last LAI dose received 2 months ago) daily for psychotic symptoms  -- restart home trazodone 150 mg daily at bedtime for insomnia, depressive symptoms -- medical management: restart home trihexyphenidyl 5 mg three times daily for drug-induced EPS -- Patient in need of nicotine replacement; nicotine polacrilex (gum) and nicotine patch 14 mg / 24 hours ordered. Smoking cessation encouraged  PRN              -- start acetaminophen 650 mg every 6 hours as needed for mild to moderate pain, fever, and headaches              -- start hydroxyzine 25 mg three times a day as needed for anxiety              -- start bismuth subsalicylate 524 mg oral chewable tablet every 3 hours as needed for indigestion              -- start senna 8.6 mg oral at bedtime as needed and polyethylene glycol 17 g oral daily as needed for mild to moderate constipation              -- start ondansetron 8 mg every 8 hours as needed for nausea or vomiting              -- start aluminum-magnesium hydroxide + simethicone 30 mL every 4 hours as needed for heartburn  -- As needed  agitation protocol in-place   Recommendations  Based on my evaluation the patient does not appear to have an emergency medical condition.  Patient will benefit from restarting outpatient medications and overnight observation. Patient denies current drug use but urine drug screen results suggests continued abuse of stimulants. Plan is to discharge early tomorrow morning so he can do walk-in clinic upstairs and establish care.  Augusto Gamble, MD 10/22/2023, 3:33 PM

## 2023-10-22 NOTE — ED Notes (Signed)
Pt asleep at this hour. No apparent distress. RR even and unlabored. Monitored for safety.  

## 2023-10-22 NOTE — ED Notes (Signed)
Patient currently sleeping in bed.

## 2023-10-22 NOTE — ED Notes (Signed)
Patient is a little restless and frigidity He was observed responding to verbal internal stimuli stating for the voice to shut the F--k up. Patient speech is disorganized and non-coherent. Patient denies VH and SI. Patient does not appear to be a threat to harm self or others. Patient verbally contracted for safety.

## 2023-10-22 NOTE — ED Notes (Signed)
Patient unable to urinate at this time. Patient provided urine cup when able to urinate.
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
## 2023-10-23 ENCOUNTER — Ambulatory Visit (HOSPITAL_COMMUNITY): Payer: MEDICAID | Admitting: Student

## 2023-10-23 LAB — MISC LABCORP TEST (SEND OUT): Labcorp test code: 735316

## 2023-10-23 NOTE — Discharge Summary (Addendum)
Cole Henderson to be D/C'd Home per MD order. Discussed with the patient and all questions fully answered. An After Visit Summary was printed and given to the patient. All belongings returned. Patient escorted to outpatient to establish care then D/C home via private auto.  Dickie La  10/23/2023 8:18 AM

## 2023-10-23 NOTE — Progress Notes (Signed)
Pt is awake, alert and oriented X4. Pt did not voice any comlainta osf pain or discomfort. No signs of acute distress noted. Administered scheduled meds early due to pt being discharged. Pt denies current SI/HI/AVH, plan or intent. Staff will monitor for pt's safety.

## 2023-10-23 NOTE — ED Notes (Signed)
Pt BP slightly elevated this morning. Pt c/o anxiety. Pt given PRNs as appropriate.

## 2023-10-23 NOTE — ED Notes (Signed)
Pt asleep at this hour. No apparent distress. RR even and unlabored. Monitored for safety.  

## 2023-11-26 ENCOUNTER — Encounter (HOSPITAL_COMMUNITY): Payer: Self-pay

## 2023-11-26 ENCOUNTER — Emergency Department (HOSPITAL_COMMUNITY): Payer: MEDICAID

## 2023-11-26 ENCOUNTER — Other Ambulatory Visit: Payer: Self-pay

## 2023-11-26 ENCOUNTER — Inpatient Hospital Stay (HOSPITAL_COMMUNITY)
Admission: EM | Admit: 2023-11-26 | Discharge: 2023-12-08 | DRG: 956 | Disposition: A | Discharge status: Home / Self Care | Payer: MEDICAID | Attending: General Surgery | Admitting: General Surgery

## 2023-11-26 DIAGNOSIS — Z888 Allergy status to other drugs, medicaments and biological substances status: Secondary | ICD-10-CM | POA: Diagnosis not present

## 2023-11-26 DIAGNOSIS — M898X9 Other specified disorders of bone, unspecified site: Secondary | ICD-10-CM | POA: Diagnosis present

## 2023-11-26 DIAGNOSIS — Y92410 Unspecified street and highway as the place of occurrence of the external cause: Secondary | ICD-10-CM | POA: Diagnosis not present

## 2023-11-26 DIAGNOSIS — Z881 Allergy status to other antibiotic agents status: Secondary | ICD-10-CM | POA: Diagnosis not present

## 2023-11-26 DIAGNOSIS — Z5941 Food insecurity: Secondary | ICD-10-CM | POA: Diagnosis not present

## 2023-11-26 DIAGNOSIS — Z791 Long term (current) use of non-steroidal anti-inflammatories (NSAID): Secondary | ICD-10-CM | POA: Diagnosis not present

## 2023-11-26 DIAGNOSIS — F17210 Nicotine dependence, cigarettes, uncomplicated: Secondary | ICD-10-CM | POA: Diagnosis present

## 2023-11-26 DIAGNOSIS — Z7982 Long term (current) use of aspirin: Secondary | ICD-10-CM

## 2023-11-26 DIAGNOSIS — Z79899 Other long term (current) drug therapy: Secondary | ICD-10-CM | POA: Diagnosis not present

## 2023-11-26 DIAGNOSIS — Z87892 Personal history of anaphylaxis: Secondary | ICD-10-CM | POA: Diagnosis not present

## 2023-11-26 DIAGNOSIS — M25062 Hemarthrosis, left knee: Secondary | ICD-10-CM | POA: Diagnosis present

## 2023-11-26 DIAGNOSIS — Z5982 Transportation insecurity: Secondary | ICD-10-CM | POA: Diagnosis not present

## 2023-11-26 DIAGNOSIS — S72492A Other fracture of lower end of left femur, initial encounter for closed fracture: Secondary | ICD-10-CM | POA: Diagnosis not present

## 2023-11-26 DIAGNOSIS — K219 Gastro-esophageal reflux disease without esophagitis: Secondary | ICD-10-CM | POA: Diagnosis present

## 2023-11-26 DIAGNOSIS — S72432A Displaced fracture of medial condyle of left femur, initial encounter for closed fracture: Secondary | ICD-10-CM | POA: Diagnosis present

## 2023-11-26 DIAGNOSIS — T1490XA Injury, unspecified, initial encounter: Principal | ICD-10-CM | POA: Diagnosis present

## 2023-11-26 DIAGNOSIS — S36115A Moderate laceration of liver, initial encounter: Secondary | ICD-10-CM | POA: Diagnosis present

## 2023-11-26 DIAGNOSIS — F32A Depression, unspecified: Secondary | ICD-10-CM | POA: Diagnosis present

## 2023-11-26 DIAGNOSIS — Z5986 Financial insecurity: Secondary | ICD-10-CM | POA: Diagnosis not present

## 2023-11-26 DIAGNOSIS — F200 Paranoid schizophrenia: Secondary | ICD-10-CM | POA: Diagnosis present

## 2023-11-26 DIAGNOSIS — Z91013 Allergy to seafood: Secondary | ICD-10-CM | POA: Diagnosis not present

## 2023-11-26 DIAGNOSIS — S2241XA Multiple fractures of ribs, right side, initial encounter for closed fracture: Secondary | ICD-10-CM | POA: Diagnosis present

## 2023-11-26 DIAGNOSIS — F1510 Other stimulant abuse, uncomplicated: Secondary | ICD-10-CM | POA: Diagnosis present

## 2023-11-26 DIAGNOSIS — W2209XA Striking against other stationary object, initial encounter: Secondary | ICD-10-CM | POA: Diagnosis present

## 2023-11-26 DIAGNOSIS — I10 Essential (primary) hypertension: Secondary | ICD-10-CM | POA: Diagnosis present

## 2023-11-26 DIAGNOSIS — Z5900 Homelessness unspecified: Secondary | ICD-10-CM

## 2023-11-26 DIAGNOSIS — Z8701 Personal history of pneumonia (recurrent): Secondary | ICD-10-CM

## 2023-11-26 DIAGNOSIS — Z79891 Long term (current) use of opiate analgesic: Secondary | ICD-10-CM | POA: Diagnosis not present

## 2023-11-26 DIAGNOSIS — Z9109 Other allergy status, other than to drugs and biological substances: Secondary | ICD-10-CM

## 2023-11-26 LAB — I-STAT CHEM 8, ED
BUN: 16 mg/dL (ref 6–20)
Calcium, Ion: 1.02 mmol/L — ABNORMAL LOW (ref 1.15–1.40)
Chloride: 102 mmol/L (ref 98–111)
Creatinine, Ser: 0.9 mg/dL (ref 0.61–1.24)
Glucose, Bld: 112 mg/dL — ABNORMAL HIGH (ref 70–99)
HCT: 36 % — ABNORMAL LOW (ref 39.0–52.0)
Hemoglobin: 12.2 g/dL — ABNORMAL LOW (ref 13.0–17.0)
Potassium: 3.6 mmol/L (ref 3.5–5.1)
Sodium: 137 mmol/L (ref 135–145)
TCO2: 23 mmol/L (ref 22–32)

## 2023-11-26 LAB — CBC
HCT: 36.8 % — ABNORMAL LOW (ref 39.0–52.0)
Hemoglobin: 11.9 g/dL — ABNORMAL LOW (ref 13.0–17.0)
MCH: 28 pg (ref 26.0–34.0)
MCHC: 32.3 g/dL (ref 30.0–36.0)
MCV: 86.6 fL (ref 80.0–100.0)
Platelets: 465 10*3/uL — ABNORMAL HIGH (ref 150–400)
RBC: 4.25 MIL/uL (ref 4.22–5.81)
RDW: 13.7 % (ref 11.5–15.5)
WBC: 9 10*3/uL (ref 4.0–10.5)
nRBC: 0 % (ref 0.0–0.2)

## 2023-11-26 LAB — COMPREHENSIVE METABOLIC PANEL
ALT: 110 U/L — ABNORMAL HIGH (ref 0–44)
AST: 125 U/L — ABNORMAL HIGH (ref 15–41)
Albumin: 4 g/dL (ref 3.5–5.0)
Alkaline Phosphatase: 54 U/L (ref 38–126)
Anion gap: 11 (ref 5–15)
BUN: 15 mg/dL (ref 6–20)
CO2: 21 mmol/L — ABNORMAL LOW (ref 22–32)
Calcium: 9.1 mg/dL (ref 8.9–10.3)
Chloride: 101 mmol/L (ref 98–111)
Creatinine, Ser: 0.91 mg/dL (ref 0.61–1.24)
GFR, Estimated: >60 mL/min (ref >60)
Glucose, Bld: 115 mg/dL — ABNORMAL HIGH (ref 70–99)
Potassium: 3.6 mmol/L (ref 3.5–5.1)
Sodium: 133 mmol/L — ABNORMAL LOW (ref 135–145)
Total Bilirubin: 1.1 mg/dL (ref <1.2)
Total Protein: 6.8 g/dL (ref 6.5–8.1)

## 2023-11-26 LAB — ETHANOL: Alcohol, Ethyl (B): <10 mg/dL (ref <10)

## 2023-11-26 LAB — TYPE AND SCREEN
ABO/RH(D): A POS
Antibody Screen: NEGATIVE

## 2023-11-26 LAB — PROTIME-INR
INR: 1 (ref 0.8–1.2)
Prothrombin Time: 13.3 s (ref 11.4–15.2)

## 2023-11-26 LAB — I-STAT CG4 LACTIC ACID, ED: Lactic Acid, Venous: 1.1 mmol/L (ref 0.5–1.9)

## 2023-11-26 MED ORDER — METOPROLOL TARTRATE 5 MG/5ML IV SOLN: 5.0000 mg | Freq: Four times a day (QID) | INTRAVENOUS | Status: DC | PRN

## 2023-11-26 MED ORDER — HYDROMORPHONE HCL 1 MG/ML IJ SOLN: 0.5000 mg | Freq: Once | INTRAMUSCULAR | Status: DC

## 2023-11-26 MED ORDER — GABAPENTIN 300 MG PO CAPS
300.0000 mg | ORAL_CAPSULE | Freq: Three times a day (TID) | ORAL | Status: DC
  Administered 2023-11-26 – 2023-12-08 (×34): 300 mg via ORAL
  Filled 2023-11-26 (×34): qty 1

## 2023-11-26 MED ORDER — ONDANSETRON HCL 4 MG/2ML IJ SOLN: 4.0000 mg | Freq: Four times a day (QID) | INTRAMUSCULAR | Status: DC | PRN

## 2023-11-26 MED ORDER — FENTANYL CITRATE PF 50 MCG/ML IJ SOSY
50.0000 ug | PREFILLED_SYRINGE | Freq: Once | INTRAMUSCULAR | Status: AC
  Administered 2023-11-26: 50 ug via INTRAVENOUS
  Filled 2023-11-26: qty 1

## 2023-11-26 MED ORDER — METHOCARBAMOL 1000 MG/10ML IJ SOLN
500.0000 mg | Freq: Three times a day (TID) | INTRAMUSCULAR | Status: AC
  Administered 2023-11-27 – 2023-11-28 (×2): 500 mg via INTRAVENOUS
  Filled 2023-11-26 (×2): qty 10

## 2023-11-26 MED ORDER — FENTANYL CITRATE PF 50 MCG/ML IJ SOSY
PREFILLED_SYRINGE | INTRAMUSCULAR | Status: AC
  Administered 2023-11-26: 100 ug via INTRAVENOUS
  Filled 2023-11-26: qty 2

## 2023-11-26 MED ORDER — OXYCODONE HCL 5 MG PO TABS
5.0000 mg | ORAL_TABLET | ORAL | Status: DC | PRN
  Administered 2023-11-26 – 2023-11-27 (×2): 5 mg via ORAL
  Filled 2023-11-26 (×2): qty 1

## 2023-11-26 MED ORDER — HYDROMORPHONE HCL 1 MG/ML IJ SOLN
1.0000 mg | INTRAMUSCULAR | Status: DC | PRN
  Administered 2023-11-27 – 2023-11-28 (×3): 1 mg via INTRAVENOUS
  Filled 2023-11-26 (×3): qty 1

## 2023-11-26 MED ORDER — POLYETHYLENE GLYCOL 3350 17 G PO PACK: 17.0000 g | PACK | Freq: Every day | ORAL | Status: DC | PRN

## 2023-11-26 MED ORDER — FENTANYL CITRATE PF 50 MCG/ML IJ SOSY: 100.0000 ug | PREFILLED_SYRINGE | Freq: Once | INTRAMUSCULAR | Status: AC

## 2023-11-26 MED ORDER — DOCUSATE SODIUM 100 MG PO CAPS
100.0000 mg | ORAL_CAPSULE | Freq: Two times a day (BID) | ORAL | Status: DC
  Administered 2023-11-26 – 2023-12-07 (×13): 100 mg via ORAL
  Filled 2023-11-26 (×20): qty 1

## 2023-11-26 MED ORDER — OXYCODONE HCL 5 MG PO TABS
10.0000 mg | ORAL_TABLET | ORAL | Status: DC | PRN
  Administered 2023-11-27 – 2023-11-30 (×13): 10 mg via ORAL
  Filled 2023-11-26 (×13): qty 2

## 2023-11-26 MED ORDER — METHOCARBAMOL 500 MG PO TABS
500.0000 mg | ORAL_TABLET | Freq: Three times a day (TID) | ORAL | Status: AC
  Administered 2023-11-26 – 2023-11-29 (×6): 500 mg via ORAL
  Filled 2023-11-26 (×6): qty 1

## 2023-11-26 MED ORDER — KCL IN DEXTROSE-NACL 20-5-0.45 MEQ/L-%-% IV SOLN
INTRAVENOUS | Status: AC
  Filled 2023-11-26 (×2): qty 1000

## 2023-11-26 MED ORDER — ACETAMINOPHEN 500 MG PO TABS
1000.0000 mg | ORAL_TABLET | Freq: Four times a day (QID) | ORAL | Status: DC
  Administered 2023-11-26 – 2023-12-08 (×44): 1000 mg via ORAL
  Filled 2023-11-26 (×46): qty 2

## 2023-11-26 MED ORDER — HYDRALAZINE HCL 20 MG/ML IJ SOLN: 10.0000 mg | INTRAMUSCULAR | Status: DC | PRN

## 2023-11-26 MED ORDER — ONDANSETRON HCL 4 MG/2ML IJ SOLN
INTRAMUSCULAR | Status: AC
  Filled 2023-11-26: qty 2

## 2023-11-26 MED ORDER — ONDANSETRON HCL 4 MG/2ML IJ SOLN
4.0000 mg | Freq: Once | INTRAMUSCULAR | Status: DC
  Filled 2023-11-26: qty 2

## 2023-11-26 MED ORDER — ONDANSETRON 4 MG PO TBDP: 4.0000 mg | ORAL_TABLET | Freq: Four times a day (QID) | ORAL | Status: DC | PRN

## 2023-11-26 MED ORDER — IOHEXOL 350 MG/ML SOLN
75.0000 mL | Freq: Once | INTRAVENOUS | Status: AC | PRN
  Administered 2023-11-26: 75 mL via INTRAVENOUS
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
## 2023-11-26 NOTE — ED Notes (Signed)
Taken to CT by this Therapist, sports.
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
## 2023-11-27 ENCOUNTER — Inpatient Hospital Stay (HOSPITAL_COMMUNITY): Payer: MEDICAID

## 2023-11-27 ENCOUNTER — Inpatient Hospital Stay (HOSPITAL_COMMUNITY): Payer: MEDICAID | Admitting: Anesthesiology

## 2023-11-27 ENCOUNTER — Encounter (HOSPITAL_COMMUNITY): Payer: Self-pay

## 2023-11-27 ENCOUNTER — Encounter (HOSPITAL_COMMUNITY): Admission: EM | Disposition: A | Discharge status: Home / Self Care | Payer: Self-pay | Source: Home / Self Care

## 2023-11-27 ENCOUNTER — Other Ambulatory Visit: Payer: Self-pay

## 2023-11-27 DIAGNOSIS — S72492A Other fracture of lower end of left femur, initial encounter for closed fracture: Secondary | ICD-10-CM | POA: Diagnosis not present

## 2023-11-27 DIAGNOSIS — I10 Essential (primary) hypertension: Secondary | ICD-10-CM

## 2023-11-27 DIAGNOSIS — F17210 Nicotine dependence, cigarettes, uncomplicated: Secondary | ICD-10-CM | POA: Diagnosis not present

## 2023-11-27 HISTORY — PX: HIP PINNING,CANNULATED: SHX1758

## 2023-11-27 LAB — CBC
HCT: 30.9 % — ABNORMAL LOW (ref 39.0–52.0)
Hemoglobin: 10.2 g/dL — ABNORMAL LOW (ref 13.0–17.0)
MCH: 28.3 pg (ref 26.0–34.0)
MCHC: 33 g/dL (ref 30.0–36.0)
MCV: 85.8 fL (ref 80.0–100.0)
Platelets: 359 10*3/uL (ref 150–400)
RBC: 3.6 MIL/uL — ABNORMAL LOW (ref 4.22–5.81)
RDW: 13.9 % (ref 11.5–15.5)
WBC: 8.6 10*3/uL (ref 4.0–10.5)
nRBC: 0 % (ref 0.0–0.2)

## 2023-11-27 LAB — BASIC METABOLIC PANEL
Anion gap: 7 (ref 5–15)
BUN: 14 mg/dL (ref 6–20)
CO2: 24 mmol/L (ref 22–32)
Calcium: 8.3 mg/dL — ABNORMAL LOW (ref 8.9–10.3)
Chloride: 102 mmol/L (ref 98–111)
Creatinine, Ser: 0.91 mg/dL (ref 0.61–1.24)
GFR, Estimated: >60 mL/min (ref >60)
Glucose, Bld: 183 mg/dL — ABNORMAL HIGH (ref 70–99)
Potassium: 4.3 mmol/L (ref 3.5–5.1)
Sodium: 133 mmol/L — ABNORMAL LOW (ref 135–145)

## 2023-11-27 LAB — HEMOGLOBIN AND HEMATOCRIT, BLOOD
HCT: 31 % — ABNORMAL LOW (ref 39.0–52.0)
HCT: 33.3 % — ABNORMAL LOW (ref 39.0–52.0)
Hemoglobin: 10.3 g/dL — ABNORMAL LOW (ref 13.0–17.0)
Hemoglobin: 10.9 g/dL — ABNORMAL LOW (ref 13.0–17.0)

## 2023-11-27 LAB — ABO/RH: ABO/RH(D): A POS

## 2023-11-27 LAB — SAMPLE TO BLOOD BANK

## 2023-11-27 SURGERY — FIXATION, FEMUR, NECK, PERCUTANEOUS, USING SCREW
Anesthesia: General | Site: Hip | Laterality: Left

## 2023-11-27 MED ORDER — SUGAMMADEX SODIUM 200 MG/2ML IV SOLN
INTRAVENOUS | Status: DC | PRN
  Administered 2023-11-27: 200 mg via INTRAVENOUS

## 2023-11-27 MED ORDER — SUCCINYLCHOLINE CHLORIDE 200 MG/10ML IV SOSY
PREFILLED_SYRINGE | INTRAVENOUS | Status: DC | PRN
  Administered 2023-11-27: 140 mg via INTRAVENOUS

## 2023-11-27 MED ORDER — CHLORHEXIDINE GLUCONATE 0.12 % MT SOLN
OROMUCOSAL | Status: AC
  Administered 2023-11-27: 15 mL via OROMUCOSAL
  Filled 2023-11-27: qty 15

## 2023-11-27 MED ORDER — CEFAZOLIN SODIUM-DEXTROSE 2-4 GM/100ML-% IV SOLN
INTRAVENOUS | Status: AC
  Filled 2023-11-27: qty 100

## 2023-11-27 MED ORDER — FENTANYL CITRATE (PF) 250 MCG/5ML IJ SOLN
INTRAMUSCULAR | Status: AC
  Filled 2023-11-27: qty 5

## 2023-11-27 MED ORDER — PROPOFOL 10 MG/ML IV BOLUS
INTRAVENOUS | Status: AC
  Filled 2023-11-27: qty 20

## 2023-11-27 MED ORDER — ONDANSETRON HCL 4 MG/2ML IJ SOLN: 4.0000 mg | Freq: Once | INTRAMUSCULAR | Status: DC | PRN

## 2023-11-27 MED ORDER — OXYCODONE HCL 5 MG PO TABS: 5.0000 mg | ORAL_TABLET | Freq: Once | ORAL | Status: DC | PRN

## 2023-11-27 MED ORDER — MIDAZOLAM HCL 2 MG/2ML IJ SOLN
INTRAMUSCULAR | Status: AC
Start: 2023-11-27 — End: ?
  Filled 2023-11-27: qty 2

## 2023-11-27 MED ORDER — 0.9 % SODIUM CHLORIDE (POUR BTL) OPTIME
TOPICAL | Status: DC | PRN
  Administered 2023-11-27: 1000 mL

## 2023-11-27 MED ORDER — CEFAZOLIN SODIUM-DEXTROSE 1-4 GM/50ML-% IV SOLN
INTRAVENOUS | Status: DC | PRN
  Administered 2023-11-27: 2 g via INTRAVENOUS

## 2023-11-27 MED ORDER — OXYCODONE HCL 5 MG/5ML PO SOLN: 5.0000 mg | Freq: Once | ORAL | Status: DC | PRN

## 2023-11-27 MED ORDER — ONDANSETRON HCL 4 MG/2ML IJ SOLN
INTRAMUSCULAR | Status: DC | PRN
  Administered 2023-11-27: 4 mg via INTRAVENOUS

## 2023-11-27 MED ORDER — HYDROMORPHONE HCL 1 MG/ML IJ SOLN
INTRAMUSCULAR | Status: DC | PRN
  Administered 2023-11-27: .5 mg via INTRAVENOUS

## 2023-11-27 MED ORDER — ROCURONIUM BROMIDE 10 MG/ML (PF) SYRINGE
PREFILLED_SYRINGE | INTRAVENOUS | Status: DC | PRN
  Administered 2023-11-27: 50 mg via INTRAVENOUS
  Administered 2023-11-27: 10 mg via INTRAVENOUS

## 2023-11-27 MED ORDER — LIDOCAINE 2% (20 MG/ML) 5 ML SYRINGE
INTRAMUSCULAR | Status: DC | PRN
  Administered 2023-11-27: 100 mg via INTRAVENOUS

## 2023-11-27 MED ORDER — PHENYLEPHRINE HCL-NACL 20-0.9 MG/250ML-% IV SOLN
INTRAVENOUS | Status: DC | PRN
  Administered 2023-11-27: 25 ug/min via INTRAVENOUS

## 2023-11-27 MED ORDER — SODIUM CHLORIDE 0.9 % IV SOLN: INTRAVENOUS | Status: DC

## 2023-11-27 MED ORDER — FENTANYL CITRATE (PF) 250 MCG/5ML IJ SOLN
INTRAMUSCULAR | Status: DC | PRN
  Administered 2023-11-27: 150 ug via INTRAVENOUS
  Administered 2023-11-27: 100 ug via INTRAVENOUS
  Administered 2023-11-27: 50 ug via INTRAVENOUS

## 2023-11-27 MED ORDER — HYDROMORPHONE HCL 1 MG/ML IJ SOLN: 0.2500 mg | INTRAMUSCULAR | Status: DC | PRN

## 2023-11-27 MED ORDER — HYDROMORPHONE HCL 1 MG/ML IJ SOLN
INTRAMUSCULAR | Status: AC
  Filled 2023-11-27: qty 0.5

## 2023-11-27 MED ORDER — PHENYLEPHRINE HCL-NACL 20-0.9 MG/250ML-% IV SOLN
INTRAVENOUS | Status: AC
  Filled 2023-11-27: qty 500

## 2023-11-27 MED ORDER — CEFAZOLIN SODIUM-DEXTROSE 2-4 GM/100ML-% IV SOLN
2.0000 g | Freq: Three times a day (TID) | INTRAVENOUS | Status: AC
  Administered 2023-11-27 – 2023-11-28 (×3): 2 g via INTRAVENOUS
  Filled 2023-11-27 (×3): qty 100

## 2023-11-27 MED ORDER — DEXAMETHASONE SODIUM PHOSPHATE 10 MG/ML IJ SOLN
INTRAMUSCULAR | Status: DC | PRN
  Administered 2023-11-27: 10 mg via INTRAVENOUS

## 2023-11-27 MED ORDER — PROPOFOL 10 MG/ML IV BOLUS
INTRAVENOUS | Status: DC | PRN
  Administered 2023-11-27: 150 mg via INTRAVENOUS

## 2023-11-27 MED ORDER — MIDAZOLAM HCL 2 MG/2ML IJ SOLN
INTRAMUSCULAR | Status: DC | PRN
  Administered 2023-11-27: 2 mg via INTRAVENOUS

## 2023-11-27 MED ORDER — DEXMEDETOMIDINE HCL IN NACL 80 MCG/20ML IV SOLN
INTRAVENOUS | Status: DC | PRN
  Administered 2023-11-27 (×2): 4 ug via INTRAVENOUS

## 2023-11-27 MED ORDER — ORAL CARE MOUTH RINSE
15.0000 mL | Freq: Once | OROMUCOSAL | Status: AC
Start: 2023-11-27 — End: 2023-11-27

## 2023-11-27 MED ORDER — CHLORHEXIDINE GLUCONATE 0.12 % MT SOLN: 15.0000 mL | Freq: Once | OROMUCOSAL | Status: AC

## 2023-11-27 SURGICAL SUPPLY — 55 items
BAG COUNTER SPONGE SURGICOUNT (BAG) ×1 IMPLANT
BIT DRILL 2.5X230 (BIT) IMPLANT
BIT DRILL 4.8X200 CANN (BIT) IMPLANT
BNDG ELASTIC 4INX 5YD STR LF (GAUZE/BANDAGES/DRESSINGS) IMPLANT
BNDG ELASTIC 6X10 VLCR STRL LF (GAUZE/BANDAGES/DRESSINGS) IMPLANT
BRUSH SCRUB EZ PLAIN DRY (MISCELLANEOUS) ×2 IMPLANT
COVER PERINEAL POST (MISCELLANEOUS) ×1 IMPLANT
COVER SURGICAL LIGHT HANDLE (MISCELLANEOUS) ×2 IMPLANT
DRAPE C-ARMOR (DRAPES) ×1 IMPLANT
DRAPE STERI IOBAN 125X83 (DRAPES) ×1 IMPLANT
DRESSING MEPILEX FLEX 4X4 (GAUZE/BANDAGES/DRESSINGS) ×1 IMPLANT
DRSG MEPILEX FLEX 4X4 (GAUZE/BANDAGES/DRESSINGS) ×1
DRSG MEPITEL 4X7.2 (GAUZE/BANDAGES/DRESSINGS) IMPLANT
ELECT REM PT RETURN 9FT ADLT (ELECTROSURGICAL) ×1
ELECTRODE REM PT RTRN 9FT ADLT (ELECTROSURGICAL) ×1 IMPLANT
GAUZE PAD ABD 8X10 STRL (GAUZE/BANDAGES/DRESSINGS) IMPLANT
GAUZE SPONGE 4X4 12PLY STRL (GAUZE/BANDAGES/DRESSINGS) IMPLANT
GLOVE BIO SURGEON STRL SZ7.5 (GLOVE) ×1 IMPLANT
GLOVE BIO SURGEON STRL SZ8 (GLOVE) ×1 IMPLANT
GLOVE BIOGEL PI IND STRL 7.5 (GLOVE) ×1 IMPLANT
GLOVE BIOGEL PI IND STRL 8 (GLOVE) ×1 IMPLANT
GLOVE SURG ORTHO LTX SZ7.5 (GLOVE) ×2 IMPLANT
GOWN STRL REUS W/ TWL LRG LVL3 (GOWN DISPOSABLE) ×2 IMPLANT
GOWN STRL REUS W/ TWL XL LVL3 (GOWN DISPOSABLE) ×1 IMPLANT
KIT BASIN OR (CUSTOM PROCEDURE TRAY) ×1 IMPLANT
KIT TURNOVER KIT B (KITS) ×1 IMPLANT
MANIFOLD NEPTUNE II (INSTRUMENTS) ×1 IMPLANT
NS IRRIG 1000ML POUR BTL (IV SOLUTION) ×1 IMPLANT
PACK GENERAL/GYN (CUSTOM PROCEDURE TRAY) ×1 IMPLANT
PAD ARMBOARD 7.5X6 YLW CONV (MISCELLANEOUS) ×2 IMPLANT
PAD CAST 4YDX4 CTTN HI CHSV (CAST SUPPLIES) IMPLANT
PADDING CAST COTTON 6X4 STRL (CAST SUPPLIES) IMPLANT
PIN GUIDE DRIL TIP 2.8X300 STE (PIN) IMPLANT
PLATE BONE 5 HOLE STRAIGHT 58M (Plate) IMPLANT
PLATE BONE 5H STRAIGHT 58 (Plate) IMPLANT
PLATE BONE 6 HOLE STRAIGHT 70M (Plate) ×1 IMPLANT
PLATE BONE 6H STRAIGHT 70 (Plate) IMPLANT
SCREW CANNULATED 8.0X90MM (Screw) IMPLANT
SCREW CANNULATED 8.8X80MM HIP (Screw) IMPLANT
SCREW CANNULATED HIP 8.0X75MM (Screw) IMPLANT
SCREW LOC UNI 3.5X50 CORTI SM (Screw) ×1 IMPLANT
SCREW LOC UNI 3.5X55 CORT SM (Screw) ×1 IMPLANT
SCREW LOC UNI 3.5X80 CORTI SM (Screw) ×1 IMPLANT
SCREW LOCK 2.5X60X3.5XST SM (Screw) IMPLANT
SCREW LOCK UNI 3.5X50 CORT SM (Screw) IMPLANT
SCREW LOCK UNI 3.5X55 CORT SM (Screw) IMPLANT
SCREW LOCK UNI 3.5X80 CORT SM (Screw) IMPLANT
STAPLER VISISTAT 35W (STAPLE) ×1 IMPLANT
SUT ETHILON 2 0 PSLX (SUTURE) IMPLANT
SUT VIC AB 0 CT1 27XBRD ANBCTR (SUTURE) ×1 IMPLANT
SUT VIC AB 1 CT1 27XBRD ANBCTR (SUTURE) ×1 IMPLANT
SUT VIC AB 2-0 CT1 TAPERPNT 27 (SUTURE) ×1 IMPLANT
TOWEL GREEN STERILE (TOWEL DISPOSABLE) ×2 IMPLANT
TOWEL GREEN STERILE FF (TOWEL DISPOSABLE) ×1 IMPLANT
WATER STERILE IRR 1000ML POUR (IV SOLUTION) ×1 IMPLANT

## 2023-11-27 NOTE — Transfer of Care (Signed)
Immediate Anesthesia Transfer of Care Note  Patient: Cole Henderson  Procedure(s) Performed: PERCUTANEOUS FIXATION OF A DISTAL FEMUR FX (Left: Hip)  Patient Location: PACU  Anesthesia Type:General  Level of Consciousness: sedated  Airway & Oxygen Therapy: Patient Spontanous Breathing and Patient connected to face mask oxygen  Post-op Assessment: Report given to RN  Post vital signs: Reviewed and stable  Last Vitals:  Vitals Value Taken Time  BP 113/70 11/27/23 1100  Temp 36.5 C 11/27/23 1100  Pulse 84 11/27/23 1103  Resp 28 11/27/23 1103  SpO2 91 % 11/27/23 1103  Vitals shown include unfiled device data.  Last Pain:  Vitals:   11/27/23 0814  TempSrc:   PainSc: 9       Patients Stated Pain Goal: 2 (11/27/23 0630)  Complications: No notable events documented.

## 2023-11-27 NOTE — Op Note (Addendum)
11/26/2023 - 11/27/2023  10:40 AM  PATIENT:  Cole Henderson  12/27/1975 male   MEDICAL RECORD NUMBER: [id number]   Operative Report   DATE OF PROCEDURE: 11/26/2023 - 11/27/2023  PREOPERATIVE DIAGNOSIS:  Left distal femur fracture with intra-articular extension involving the medial condyle.  POSTOPERATIVE DIAGNOSIS:   1. Left distal femur fracture with intra-articular extension involving the medial condyle. 2. STABLE LEFT KNEE.  PROCEDURE:   1. Open reduction internal fixation of left distal femur intra-articular medial condyle fracture. 2. MANUAL APPLICATION OF STRESS UNDER FLUOROSCOPY LEFT KNEE. 3. ASPIRATION OF LEFT KNEE JOINT  SURGEON:  Doralee Albino. Carola Frost, MD.  ASSISTANT:  None.  ANESTHESIA:  General.  COMPLICATIONS:  None.  TOURNIQUET:  None.  SPECIMENS:  None.  ESTIMATED BLOOD LOSS:  30 mL  DISPOSITION:  To PACU.  CONDITION:  Stable.  BRIEF SUMMARY AND INDICATIONS FOR PROCEDURE:  The patient is a 47 y.o. who sustained injury to the knee resulting in a displaced fracture of the medial condyle with a posteromedial shear and intraarticular displacement.  I discussed with  the patient preoperatively the risks and benefits of surgery including the possibility of infection, nerve injury, vessel injury, DVT, PE, loss of motion, arthritis, malunion, nonunion, and symptomatic hardware, many of which could necessitate a  subsequent surgery.  The patient acknowledged these risks and provided consent to proceed.  BRIEF SUMMARY OF THE PROCEDURE:  The patient was taken to the operating room where general anesthesia was induced.  The operative lower extremity was prepped and draped in the usual sterile fashion using first a chlorhexidine wash, then Betadine scrub and paint.  Timeout was held.  C-arm was brought in to localize the best area to make a medial incision.  This was checked on AP and lateral views.  A 6 cm longitudinal incision was then made over the superior aspect of the  medial condyle.  Dissection was carried carefully down to avoid injury to the saphenous vein and nerve which were carefully retracted by my assistant.  The deep retinaculum was incised and the VMO retracted anteriorly.    While my assistant pulled traction I engaged the large King tong clamp and compressed the articular fracture together anatomically.  Three threaded guidewires were then placed from medial to lateral, getting sufficient spread in the condyles and using partially threaded Biomet 8.0 mm screws.  Additional compression and fixation was obtained.    I was then able to place the custom contoured recon plate directly on the bone with the help of my assistant who pulled traction and valgus stress. I placed K wires for provisional fixation and then followed with a 3.5 screw in the plate to produce a buttress effect which pushed the medial condyle distally and opposing it to the distal femur.  Additional screws were placed above that and then in back and forth fashion these were tightened to maximal buttress effect.    Final images consisting of AP and lateral views were obtained showing excellent reduction and hardware placement and length.  Manual application of stress to the knee did not reveal any opening in varus or valgus, confirming that the collateral ligaments were intact and stable.  Wound was irrigated thoroughly, closed in standard layered fashion using #1 Vicryl, 2-0 Vicryl and 2-0 nylon for the skin.  A sterile, gently compressive dressing was applied and a knee immobilizer.  The patient was taken to the PACU in stable condition.  Youlanda Roys, RNFA, assisted with closure and retraction.  In order to  facilitate early motion the knee was then aspirated of his hemarthrosis.  This was accomplished with a 18-gauge needle and large syringe.  A sterile gently compressive dressing was then applied.  Patient was awakened from anesthesia and transported to the PACU in stable  condition.  PROGNOSIS:  The patient will have unrestricted range of motion of the knee with supplemental hinged knee brace for  support.  Weightbearing will be delayed for 6 weeks when we will anticipate progressing.  He will be on formal DVT prophylaxis and a metabolic bone workup is ongoing at this time.  History of drug abuse carries elevated risk of noncompliance and catastrophic complications.

## 2023-11-27 NOTE — Progress Notes (Signed)
Orthopedic Tech Progress Note Patient Details:  Cole Henderson 14-Jun-1976 [id number]  Placed bone foam on pt. Ortho Devices Type of Ortho Device: Bone foam zero knee Ortho Device/Splint Location: LLE Ortho Device/Splint Interventions: Ordered, Application, Adjustment   Post Interventions Patient Tolerated: Well Instructions Provided: Care of device, Adjustment of device  Sherilyn Banker 11/27/2023, 3:15 PM

## 2023-11-27 NOTE — Plan of Care (Signed)
  Problem: Clinical Measurements: Goal: Will remain free from infection Outcome: Progressing   Problem: Activity: Goal: Risk for activity intolerance will decrease Outcome: Progressing   

## 2023-11-27 NOTE — Anesthesia Postprocedure Evaluation (Signed)
Anesthesia Post Note  Patient: Cole Henderson  Procedure(s) Performed: PERCUTANEOUS FIXATION OF A DISTAL FEMUR FX (Left: Hip)     Patient location during evaluation: PACU Anesthesia Type: General Level of consciousness: sedated and patient cooperative Pain management: pain level controlled Vital Signs Assessment: post-procedure vital signs reviewed and stable Respiratory status: spontaneous breathing, nonlabored ventilation, respiratory function stable and patient connected to nasal cannula oxygen Cardiovascular status: blood pressure returned to baseline and stable Postop Assessment: no apparent nausea or vomiting Anesthetic complications: no   No notable events documented.  Last Vitals:  Vitals:   11/27/23 1145 11/27/23 1203  BP: (!) 143/89 (!) 147/87  Pulse: 84 86  Resp: (!) 23 (!) 22  Temp: 36.7 C 36.5 C  SpO2: 94% 98%    Last Pain:  Vitals:   11/27/23 1317  TempSrc:   PainSc: 5                  Wash Nienhaus,E. Alban Marucci

## 2023-11-27 NOTE — Progress Notes (Signed)
Verbal report given to CRNA.
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
## 2023-11-27 NOTE — Progress Notes (Signed)
Pt returned to unit from PACU. A&O x 4 with drowsiness. C/o pain 9/10 to left leg. Oriented pt back to unit.

## 2023-11-27 NOTE — Progress Notes (Signed)
Pt off the unit to OR.  

## 2023-11-28 LAB — HEMOGLOBIN AND HEMATOCRIT, BLOOD
HCT: 30.7 % — ABNORMAL LOW (ref 39.0–52.0)
Hemoglobin: 10.1 g/dL — ABNORMAL LOW (ref 13.0–17.0)

## 2023-11-28 LAB — CBC
HCT: 29.7 % — ABNORMAL LOW (ref 39.0–52.0)
Hemoglobin: 9.8 g/dL — ABNORMAL LOW (ref 13.0–17.0)
MCH: 28.3 pg (ref 26.0–34.0)
MCHC: 33 g/dL (ref 30.0–36.0)
MCV: 85.8 fL (ref 80.0–100.0)
Platelets: 316 10*3/uL (ref 150–400)
RBC: 3.46 MIL/uL — ABNORMAL LOW (ref 4.22–5.81)
RDW: 13.8 % (ref 11.5–15.5)
WBC: 13.3 10*3/uL — ABNORMAL HIGH (ref 4.0–10.5)
nRBC: 0 % (ref 0.0–0.2)

## 2023-11-28 LAB — BASIC METABOLIC PANEL
Anion gap: 8 (ref 5–15)
BUN: 11 mg/dL (ref 6–20)
CO2: 26 mmol/L (ref 22–32)
Calcium: 8.4 mg/dL — ABNORMAL LOW (ref 8.9–10.3)
Chloride: 103 mmol/L (ref 98–111)
Creatinine, Ser: 0.79 mg/dL (ref 0.61–1.24)
GFR, Estimated: >60 mL/min (ref >60)
Glucose, Bld: 136 mg/dL — ABNORMAL HIGH (ref 70–99)
Potassium: 4.3 mmol/L (ref 3.5–5.1)
Sodium: 137 mmol/L (ref 135–145)

## 2023-11-28 LAB — VITAMIN D 25 HYDROXY (VIT D DEFICIENCY, FRACTURES): Vit D, 25-Hydroxy: 44.76 ng/mL (ref 30–100)

## 2023-11-28 MED ORDER — TRIHEXYPHENIDYL HCL 5 MG PO TABS
5.0000 mg | ORAL_TABLET | Freq: Three times a day (TID) | ORAL | Status: DC
Start: 2023-11-28 — End: 2023-12-08
  Administered 2023-11-28 – 2023-12-08 (×31): 5 mg via ORAL
  Filled 2023-11-28 (×32): qty 1
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
## 2023-11-28 NOTE — Progress Notes (Signed)
[facility] (OTS)  1 Day Post-Op Procedure(s) (LRB): PERCUTANEOUS FIXATION OF A DISTAL FEMUR FX (Left)  Subjective: Patient reports pain as  most severe right rib cage but left femur well tolerated .    Objective: Current Vitals Blood pressure 113/73, pulse (!) 102, temperature 97.7 F (36.5 C), temperature source Oral, resp. rate 17, height 6' (1.829 m), weight 89.9 kg, SpO2 96%. Vital signs in last 24 hours: Temp:  [97.7 F (36.5 C)-98.6 F (37 C)] 97.7 F (36.5 C) (12/25 0756) Pulse Rate:  [82-105] 102 (12/25 0756) Resp:  [14-28] 17 (12/25 0756) BP: (111-147)/(70-93) 113/73 (12/25 0756) SpO2:  [92 %-99 %] 96 % (12/25 0756)  Intake/Output from previous day: 12/24 0701 - 12/25 0700 In: 1330 [P.O.:480; I.V.:550; IV Piggyback:300] Out: 1300 [Urine:1200; Blood:100]  LABS Recent Labs    11/26/23 2345 11/27/23 0512 11/27/23 1840 11/27/23 2349 11/28/23 0425  HGB 10.3* 10.2* 10.9* 10.1* 9.8*   Recent Labs    11/27/23 0512 11/27/23 1840 11/27/23 2349 11/28/23 0425  WBC 8.6  --   --  13.3*  RBC 3.60*  --   --  3.46*  HCT 30.9*   < > 30.7* 29.7*  PLT 359  --   --  316   < > = values in this interval not displayed.   Recent Labs    11/27/23 1840 11/28/23 0425  NA 133* 137  K 4.3 4.3  CL 102 103  CO2 24 26  BUN 14 11  CREATININE 0.91 0.79  GLUCOSE 183* 136*  CALCIUM 8.3* 8.4*   Recent Labs    11/26/23 1610  INR 1.0     Physical Exam LLE  Dressing intact, clean, dry  Edema/ swelling controlled  Sens: DPN, SPN, TN intact  Motor: EHL, FHL, and lessor toe ext and flex all intact grossly  Brisk cap refill, warm to touch, DP 2+  Assessment/Plan: 1 Day Post-Op Procedure(s) (LRB): PERCUTANEOUS FIXATION OF A DISTAL FEMUR FX (Left) Very appreciative  1. PT/OT with NWB LLE, no motion restrictions; ok to remove wrap dressing tomorrow and convert to mepilex 2. DVT proph per trauma given liver laceration 3. F/u 10-14 days  Myrene Galas,  MD [facility], [facility] [phone]

## 2023-11-28 NOTE — Plan of Care (Signed)
  Problem: Clinical Measurements: Goal: Will remain free from infection Outcome: Not Progressing   Problem: Activity: Goal: Risk for activity intolerance will decrease Outcome: Not Progressing   Problem: Education: Goal: Knowledge of General Education information will improve Description: Including pain rating scale, medication(s)/side effects and non-pharmacologic comfort measures Outcome: Not Progressing   Problem: Health Behavior/Discharge Planning: Goal: Ability to manage health-related needs will improve Outcome: Not Progressing   Problem: Clinical Measurements: Goal: Ability to maintain clinical measurements within normal limits will improve Outcome: Not Progressing Goal: Will remain free from infection Outcome: Not Progressing Goal: Diagnostic test results will improve Outcome: Not Progressing Goal: Respiratory complications will improve Outcome: Not Progressing Goal: Cardiovascular complication will be avoided Outcome: Not Progressing   Problem: Activity: Goal: Risk for activity intolerance will decrease Outcome: Not Progressing   Problem: Elimination: Goal: Will not experience complications related to bowel motility Outcome: Not Progressing Goal: Will not experience complications related to urinary retention Outcome: Not Progressing   Problem: Pain Management: Goal: General experience of comfort will improve Outcome: Not Progressing

## 2023-11-29 ENCOUNTER — Other Ambulatory Visit (HOSPITAL_COMMUNITY): Payer: Self-pay

## 2023-11-29 LAB — BASIC METABOLIC PANEL
Anion gap: 8 (ref 5–15)
BUN: 11 mg/dL (ref 6–20)
CO2: 28 mmol/L (ref 22–32)
Calcium: 8.5 mg/dL — ABNORMAL LOW (ref 8.9–10.3)
Chloride: 102 mmol/L (ref 98–111)
Creatinine, Ser: 0.75 mg/dL (ref 0.61–1.24)
GFR, Estimated: >60 mL/min (ref >60)
Glucose, Bld: 102 mg/dL — ABNORMAL HIGH (ref 70–99)
Potassium: 4.4 mmol/L (ref 3.5–5.1)
Sodium: 138 mmol/L (ref 135–145)

## 2023-11-29 LAB — CBC
HCT: 30.7 % — ABNORMAL LOW (ref 39.0–52.0)
Hemoglobin: 10 g/dL — ABNORMAL LOW (ref 13.0–17.0)
MCH: 28.2 pg (ref 26.0–34.0)
MCHC: 32.6 g/dL (ref 30.0–36.0)
MCV: 86.5 fL (ref 80.0–100.0)
Platelets: 287 10*3/uL (ref 150–400)
RBC: 3.55 MIL/uL — ABNORMAL LOW (ref 4.22–5.81)
RDW: 13.9 % (ref 11.5–15.5)
WBC: 11.7 10*3/uL — ABNORMAL HIGH (ref 4.0–10.5)
nRBC: 0 % (ref 0.0–0.2)

## 2023-11-29 MED ORDER — NICOTINE 21 MG/24HR TD PT24
21.0000 mg | MEDICATED_PATCH | Freq: Every day | TRANSDERMAL | Status: DC
  Administered 2023-11-29 – 2023-12-08 (×10): 21 mg via TRANSDERMAL
  Filled 2023-11-29 (×10): qty 1

## 2023-11-29 MED ORDER — APIXABAN 2.5 MG PO TABS
2.5000 mg | ORAL_TABLET | Freq: Two times a day (BID) | ORAL | 0 refills | Status: AC
  Filled 2023-11-29: qty 60, 30d supply, fill #0

## 2023-11-29 MED ORDER — IBUPROFEN 200 MG PO TABS
600.0000 mg | ORAL_TABLET | Freq: Four times a day (QID) | ORAL | Status: DC | PRN
  Administered 2023-11-30: 600 mg via ORAL
  Filled 2023-11-29: qty 3

## 2023-11-29 MED ORDER — HYDROMORPHONE HCL 1 MG/ML IJ SOLN
1.0000 mg | Freq: Four times a day (QID) | INTRAMUSCULAR | Status: DC | PRN
Start: 2023-11-29 — End: 2023-11-30
  Administered 2023-11-29 – 2023-11-30 (×2): 1 mg via INTRAVENOUS
  Filled 2023-11-29 (×2): qty 1

## 2023-11-29 NOTE — Plan of Care (Signed)
°  Problem: Coping: °Goal: Level of anxiety will decrease °Outcome: Progressing °  °

## 2023-11-29 NOTE — Discharge Instructions (Addendum)
Orthopaedic Trauma Service Discharge Instructions   General Discharge Instructions  Orthopaedic Injuries:  Left distal femur fracture treated with open reduction internal fixation using plate and screws  WEIGHT BEARING STATUS: Nonweightbearing left leg for 6 weeks.  Use crutches or walker to mobilize.  RANGE OF MOTION/ACTIVITY: Unrestricted range of motion of left knee.  Activity as tolerated while maintaining weightbearing restrictions.  Continue to do leg exercises that were taught to you by therapy 2-3 times a day  Bone health:   Review the following resource for additional information regarding bone health  [email or web address].cy  Wound Care: Daily wound care starting on 12/01/2023.  See below Discharge Wound Care Instructions  Do NOT apply any ointments, solutions or lotions to pin sites or surgical wounds.  These prevent needed drainage and even though solutions like hydrogen peroxide kill bacteria, they also damage cells lining the pin sites that help fight infection.  Applying lotions or ointments can keep the wounds moist and can cause them to breakdown and open up as well. This can increase the risk for infection. When in doubt call the office.  Surgical incisions should be dressed daily.  If any drainage is noted, use one layer of adaptic or Mepitel, then gauze, Kerlix, and an ace wrap.  NetCamper.cz [email or web address]://dennis-soto.com/?pd_rd_[email or web address]&th=1  [email or web address].com/  These dressing supplies should be available at local medical supply stores ([employer], [employer], etc). They are not usually carried at places like [facility], [facility], [facility], etc  Once the incision is completely dry and without drainage, it may be left open to air out.  Showering may  begin 36-48 hours later.  Cleaning gently with soap and water.   DVT/PE prophylaxis: eliquis 2.5 mg tablet every 12 hours for 30 days for blood clot prevention  Diet: as you were eating previously.  Can use over the counter stool softeners and bowel preparations, such as Miralax, to help with bowel movements.  Narcotics can be constipating.  Be sure to drink plenty of fluids  PAIN MEDICATION USE AND EXPECTATIONS  You have likely been given narcotic medications to help control your pain.  After a traumatic event that results in an fracture (broken bone) with or without surgery, it is ok to use narcotic pain medications to help control one's pain.  We understand that everyone responds to pain differently and each individual patient will be evaluated on a regular basis for the continued need for narcotic medications. Ideally, narcotic medication use should last no more than 6-8 weeks (coinciding with fracture healing).   As a patient it is your responsibility as well to monitor narcotic medication use and report the amount and frequency you use these medications when you come to your office visit.   We would also advise that if you are using narcotic medications, you should take a dose prior to therapy to maximize you participation.  IF YOU ARE ON NARCOTIC MEDICATIONS IT IS NOT PERMISSIBLE TO OPERATE A MOTOR VEHICLE (MOTORCYCLE/CAR/TRUCK/MOPED) OR HEAVY MACHINERY DO NOT MIX NARCOTICS WITH OTHER CNS (CENTRAL NERVOUS SYSTEM) DEPRESSANTS SUCH AS ALCOHOL   POST-OPERATIVE OPIOID TAPER INSTRUCTIONS: It is important to wean off of your opioid medication as soon as possible. If you do not need pain medication after your surgery it is ok to stop day one. Opioids include: Codeine, Hydrocodone(Norco, Vicodin), Oxycodone(Percocet, oxycontin) and hydromorphone amongst others.  Long term and even short term use of opiods can cause: Increased pain response Dependence Constipation Depression Respiratory  depression And more.  Withdrawal symptoms can include Flu  like symptoms Nausea, vomiting And more Techniques to manage these symptoms Hydrate well Eat regular healthy meals Stay active Use relaxation techniques(deep breathing, meditating, yoga) Do Not substitute Alcohol to help with tapering If you have been on opioids for less than two weeks and do not have pain than it is ok to stop all together.  Plan to wean off of opioids This plan should start within one week post op of your fracture surgery  Maintain the same interval or time between taking each dose and first decrease the dose.  Cut the total daily intake of opioids by one tablet each day Next start to increase the time between doses. The last dose that should be eliminated is the evening dose.    STOP SMOKING OR USING NICOTINE PRODUCTS!!!!  As discussed nicotine severely impairs your body's ability to heal surgical and traumatic wounds but also impairs bone healing.  Wounds and bone heal by forming microscopic blood vessels (angiogenesis) and nicotine is a vasoconstrictor (essentially, shrinks blood vessels).  Therefore, if vasoconstriction occurs to these microscopic blood vessels they essentially disappear and are unable to deliver necessary nutrients to the healing tissue.  This is one modifiable factor that you can do to dramatically increase your chances of healing your injury.    (This means no smoking, no nicotine gum, patches, etc)  DO NOT USE NONSTEROIDAL ANTI-INFLAMMATORY DRUGS (NSAID'S)  Using products such as Advil (ibuprofen), Aleve (naproxen), Motrin (ibuprofen) for additional pain control during fracture healing can delay and/or prevent the healing response.  If you would like to take over the counter (OTC) medication, Tylenol (acetaminophen) is ok.  However, some narcotic medications that are given for pain control contain acetaminophen as well. Therefore, you should not exceed more than 4000 mg of tylenol in a day  if you do not have liver disease.  Also note that there are may OTC medicines, such as cold medicines and allergy medicines that my contain tylenol as well.  If you have any questions about medications and/or interactions please ask your doctor/PA or your pharmacist.      ICE AND ELEVATE INJURED/OPERATIVE EXTREMITY  Using ice and elevating the injured extremity above your heart can help with swelling and pain control.  Icing in a pulsatile fashion, such as 20 minutes on and 20 minutes off, can be followed.    Do not place ice directly on skin. Make sure there is a barrier between to skin and the ice pack.    Using frozen items such as frozen peas works well as the conform nicely to the are that needs to be iced.  USE AN ACE WRAP OR TED HOSE FOR SWELLING CONTROL  In addition to icing and elevation, Ace wraps or TED hose are used to help limit and resolve swelling.  It is recommended to use Ace wraps or TED hose until you are informed to stop.    When using Ace Wraps start the wrapping distally (farthest away from the body) and wrap proximally (closer to the body)   Example: If you had surgery on your leg or thing and you do not have a splint on, start the ace wrap at the toes and work your way up to the thigh        If you had surgery on your upper extremity and do not have a splint on, start the ace wrap at your fingers and work your way up to the upper arm  IF YOU ARE IN A SPLINT OR CAST  DO NOT REMOVE IT FOR ANY REASON   If your splint gets wet for any reason please contact the office immediately. You may shower in your splint or cast as long as you keep it dry.  This can be done by wrapping in a cast cover or garbage back (or similar)  Do Not stick any thing down your splint or cast such as pencils, money, or hangers to try and scratch yourself with.  If you feel itchy take benadryl as prescribed on the bottle for itching  IF YOU ARE IN A CAM BOOT (BLACK BOOT)  You may remove boot periodically.  Perform daily dressing changes as noted below.  Wash the liner of the boot regularly and wear a sock when wearing the boot. It is recommended that you sleep in the boot until told otherwise    Call office for the following: Temperature greater than 101F Persistent nausea and vomiting Severe uncontrolled pain Redness, tenderness, or signs of infection (pain, swelling, redness, odor or green/yellow discharge around the site) Difficulty breathing, headache or visual disturbances Hives Persistent dizziness or light-headedness Extreme fatigue Any other questions or concerns you may have after discharge  In an emergency, call 911 or go to an Emergency Department at a nearby hospital  HELPFUL INFORMATION  If you had a block, it will wear off between 8-24 hrs postop typically.  This is period when your pain may go from nearly zero to the pain you would have had postop without the block.  This is an abrupt transition but nothing dangerous is happening.  You may take an extra dose of narcotic when this happens.  You should wean off your narcotic medicines as soon as you are able.  Most patients will be off or using minimal narcotics before their first postop appointment.   We suggest you use the pain medication the first night prior to going to bed, in order to ease any pain when the anesthesia wears off. You should avoid taking pain medications on an empty stomach as it will make you nauseous.  Do not drink alcoholic beverages or take illicit drugs when taking pain medications.  In most states it is against the law to drive while you are in a splint or sling.  And certainly against the law to drive while taking narcotics.  You may return to work/school in the next couple of days when you feel up to it.   Pain medication may make you constipated.  Below are a few solutions to try in this order: Decrease the amount of pain medication if you aren't having pain. Drink lots of decaffeinated  fluids. Drink prune juice and/or each dried prunes  If the first 3 don't work start with additional solutions Take Colace - an over-the-counter stool softener Take Senokot - an over-the-counter laxative Take Miralax - a stronger over-the-counter laxative     CALL THE OFFICE WITH ANY QUESTIONS OR CONCERNS: [phone]   VISIT OUR WEBSITE FOR ADDITIONAL INFORMATION: [email or web address]
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
## 2023-11-29 NOTE — Progress Notes (Signed)
Orthopedic Tech Progress Note Patient Details:  Malahki Stjuste 1976-08-07 [id number]  Order for bilateral Vive compression socks called into [facility].  Patient ID: Lissandro Crocker, male   DOB: [birth year], 47 y.o.   MRN: [id number]  Docia Furl 11/29/2023, 2:23 PM

## 2023-11-29 NOTE — Progress Notes (Signed)
   11/29/23 1900  Spiritual Encounters  Type of Visit Initial  Care provided to: Pt and family  Conversation partners present during encounter Nurse  Referral source Patient request  Reason for visit Routine spiritual support  OnCall Visit Yes   Chaplain responded to request for emotional and spiritual support. Patient's mother was present at bedside. She wanted chaplain to say a word of prayer for the patient. Chaplain helped patient process his thoughts and feelings and prayed with patient. No follow-up needed at this time.
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
## 2023-11-30 ENCOUNTER — Other Ambulatory Visit (HOSPITAL_COMMUNITY): Payer: Self-pay

## 2023-11-30 ENCOUNTER — Encounter (HOSPITAL_COMMUNITY): Payer: Self-pay | Admitting: Orthopedic Surgery

## 2023-11-30 LAB — CBC
HCT: 33.2 % — ABNORMAL LOW (ref 39.0–52.0)
Hemoglobin: 11.1 g/dL — ABNORMAL LOW (ref 13.0–17.0)
MCH: 28.3 pg (ref 26.0–34.0)
MCHC: 33.4 g/dL (ref 30.0–36.0)
MCV: 84.7 fL (ref 80.0–100.0)
Platelets: 304 10*3/uL (ref 150–400)
RBC: 3.92 MIL/uL — ABNORMAL LOW (ref 4.22–5.81)
RDW: 14 % (ref 11.5–15.5)
WBC: 10.2 10*3/uL (ref 4.0–10.5)
nRBC: 0 % (ref 0.0–0.2)

## 2023-11-30 LAB — BASIC METABOLIC PANEL
Anion gap: 9 (ref 5–15)
BUN: 15 mg/dL (ref 6–20)
CO2: 27 mmol/L (ref 22–32)
Calcium: 8.8 mg/dL — ABNORMAL LOW (ref 8.9–10.3)
Chloride: 99 mmol/L (ref 98–111)
Creatinine, Ser: 0.71 mg/dL (ref 0.61–1.24)
GFR, Estimated: >60 mL/min (ref >60)
Glucose, Bld: 120 mg/dL — ABNORMAL HIGH (ref 70–99)
Potassium: 4.5 mmol/L (ref 3.5–5.1)
Sodium: 135 mmol/L (ref 135–145)

## 2023-11-30 MED ORDER — ENOXAPARIN SODIUM 30 MG/0.3ML IJ SOSY
30.0000 mg | PREFILLED_SYRINGE | Freq: Two times a day (BID) | INTRAMUSCULAR | Status: DC
  Administered 2023-11-30 – 2023-12-08 (×11): 30 mg via SUBCUTANEOUS
  Filled 2023-11-30 (×17): qty 0.3

## 2023-11-30 MED ORDER — OXYCODONE HCL 5 MG PO TABS
10.0000 mg | ORAL_TABLET | ORAL | Status: DC | PRN
  Administered 2023-11-30: 5 mg via ORAL
  Administered 2023-11-30: 10 mg via ORAL
  Administered 2023-12-01 – 2023-12-08 (×25): 15 mg via ORAL
  Administered 2023-12-08: 10 mg via ORAL
  Filled 2023-11-30 (×2): qty 2
  Filled 2023-11-30 (×16): qty 3
  Filled 2023-11-30: qty 2
  Filled 2023-11-30 (×9): qty 3

## 2023-11-30 MED ORDER — HYDROMORPHONE HCL 1 MG/ML IJ SOLN
0.5000 mg | INTRAMUSCULAR | Status: DC | PRN
  Administered 2023-11-30 – 2023-12-02 (×7): 0.5 mg via INTRAVENOUS
  Filled 2023-11-30 (×7): qty 0.5

## 2023-11-30 MED ORDER — POLYETHYLENE GLYCOL 3350 17 G PO PACK
17.0000 g | PACK | Freq: Two times a day (BID) | ORAL | Status: DC
  Administered 2023-11-30 – 2023-12-06 (×5): 17 g via ORAL
  Filled 2023-11-30 (×14): qty 1
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
## 2023-11-30 NOTE — Plan of Care (Signed)
Patient used wheelchair to move around unit without incident. Complains of burning and itching sensation in right leg. Was able to transition to recliner without issue.

## 2023-11-30 NOTE — Plan of Care (Signed)
  Problem: Activity: Goal: Risk for activity intolerance will decrease Outcome: Progressing   

## 2023-11-30 NOTE — Progress Notes (Signed)
Pharmacy consult re: timing of last Abilify Maintena  Confimed with [facility] that pt received last Abilify Maintena on 12/1. It is due every 28 days so next dose would be 12/29. Psych must order this for inpt according to [employer] policy so will try to get them to okay this dose. Pt with multiple home issues - basically homeless so hopefully he can get this med while here.  Christoper Fabian, PharmD, BCPS Please see amion for complete clinical pharmacist phone list 11/30/2023 11:28 AM

## 2023-12-01 DIAGNOSIS — F209 Schizophrenia, unspecified: Secondary | ICD-10-CM | POA: Insufficient documentation

## 2023-12-01 LAB — CBC
HCT: 36.1 % — ABNORMAL LOW (ref 39.0–52.0)
Hemoglobin: 11.9 g/dL — ABNORMAL LOW (ref 13.0–17.0)
MCH: 27.9 pg (ref 26.0–34.0)
MCHC: 33 g/dL (ref 30.0–36.0)
MCV: 84.7 fL (ref 80.0–100.0)
Platelets: 365 10*3/uL (ref 150–400)
RBC: 4.26 MIL/uL (ref 4.22–5.81)
RDW: 13.8 % (ref 11.5–15.5)
WBC: 9.5 10*3/uL (ref 4.0–10.5)
nRBC: 0 % (ref 0.0–0.2)

## 2023-12-01 MED ORDER — BISACODYL 10 MG RE SUPP
10.0000 mg | Freq: Once | RECTAL | Status: AC
  Administered 2023-12-01: 10 mg via RECTAL
  Filled 2023-12-01 (×2): qty 1

## 2023-12-01 MED ORDER — ARIPIPRAZOLE ER 400 MG IM SRER
400.0000 mg | Freq: Once | INTRAMUSCULAR | Status: AC
  Administered 2023-12-01: 400 mg via INTRAMUSCULAR
  Filled 2023-12-01: qty 2

## 2023-12-01 MED ORDER — BISACODYL 5 MG PO TBEC
10.0000 mg | DELAYED_RELEASE_TABLET | Freq: Once | ORAL | Status: AC
  Administered 2023-12-01: 10 mg via ORAL
  Filled 2023-12-01: qty 2

## 2023-12-01 MED ORDER — SENNA 8.6 MG PO TABS
2.0000 | ORAL_TABLET | Freq: Once | ORAL | Status: AC
  Administered 2023-12-01: 17.2 mg via ORAL
  Filled 2023-12-01 (×2): qty 2

## 2023-12-01 MED ORDER — MAGNESIUM HYDROXIDE 400 MG/5ML PO SUSP
30.0000 mL | Freq: Once | ORAL | Status: AC
  Administered 2023-12-01: 30 mL via ORAL
  Filled 2023-12-01 (×2): qty 30

## 2023-12-01 MED ORDER — MAGNESIUM CITRATE PO SOLN
1.0000 | Freq: Once | ORAL | Status: AC
  Administered 2023-12-01: 1 via ORAL
  Filled 2023-12-01: qty 296

## 2023-12-01 NOTE — Progress Notes (Signed)
Consult received to place IV. Pt getting out of bed at this time to go to bathroom. IV Team will return.
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
## 2023-12-01 NOTE — Plan of Care (Signed)
  Problem: Clinical Measurements: Goal: Will remain free from infection Outcome: Progressing   Problem: Activity: Goal: Risk for activity intolerance will decrease Outcome: Progressing   Problem: Education: Goal: Knowledge of General Education information will improve Description: Including pain rating scale, medication(s)/side effects and non-pharmacologic comfort measures Outcome: Progressing   Problem: Health Behavior/Discharge Planning: Goal: Ability to manage health-related needs will improve Outcome: Progressing   Problem: Clinical Measurements: Goal: Ability to maintain clinical measurements within normal limits will improve Outcome: Progressing Goal: Will remain free from infection Outcome: Progressing Goal: Diagnostic test results will improve Outcome: Progressing Goal: Respiratory complications will improve Outcome: Progressing Goal: Cardiovascular complication will be avoided Outcome: Progressing   Problem: Activity: Goal: Risk for activity intolerance will decrease Outcome: Progressing   Problem: Nutrition: Goal: Adequate nutrition will be maintained Outcome: Progressing   Problem: Coping: Goal: Level of anxiety will decrease Outcome: Progressing   Problem: Elimination: Goal: Will not experience complications related to bowel motility Outcome: Progressing Goal: Will not experience complications related to urinary retention Outcome: Progressing   Problem: Pain Management: Goal: General experience of comfort will improve Outcome: Progressing   Problem: Safety: Goal: Ability to remain free from injury will improve Outcome: Progressing   Problem: Skin Integrity: Goal: Risk for impaired skin integrity will decrease Outcome: Progressing

## 2023-12-02 LAB — BASIC METABOLIC PANEL
Anion gap: 9 (ref 5–15)
BUN: 15 mg/dL (ref 6–20)
CO2: 26 mmol/L (ref 22–32)
Calcium: 8.8 mg/dL — ABNORMAL LOW (ref 8.9–10.3)
Chloride: 96 mmol/L — ABNORMAL LOW (ref 98–111)
Creatinine, Ser: 0.68 mg/dL (ref 0.61–1.24)
GFR, Estimated: >60 mL/min (ref >60)
Glucose, Bld: 122 mg/dL — ABNORMAL HIGH (ref 70–99)
Potassium: 4.5 mmol/L (ref 3.5–5.1)
Sodium: 131 mmol/L — ABNORMAL LOW (ref 135–145)

## 2023-12-02 LAB — CBC
HCT: 34.4 % — ABNORMAL LOW (ref 39.0–52.0)
Hemoglobin: 11.2 g/dL — ABNORMAL LOW (ref 13.0–17.0)
MCH: 27.7 pg (ref 26.0–34.0)
MCHC: 32.6 g/dL (ref 30.0–36.0)
MCV: 85.1 fL (ref 80.0–100.0)
Platelets: 346 10*3/uL (ref 150–400)
RBC: 4.04 MIL/uL — ABNORMAL LOW (ref 4.22–5.81)
RDW: 13.6 % (ref 11.5–15.5)
WBC: 11 10*3/uL — ABNORMAL HIGH (ref 4.0–10.5)
nRBC: 0 % (ref 0.0–0.2)

## 2023-12-02 MED ORDER — METHOCARBAMOL 500 MG PO TABS
1000.0000 mg | ORAL_TABLET | Freq: Three times a day (TID) | ORAL | Status: DC
  Administered 2023-12-02 – 2023-12-03 (×4): 1000 mg via ORAL
  Filled 2023-12-02 (×4): qty 2

## 2023-12-02 MED ORDER — HYDROMORPHONE HCL 1 MG/ML IJ SOLN
0.5000 mg | Freq: Four times a day (QID) | INTRAMUSCULAR | Status: DC | PRN
  Administered 2023-12-02 – 2023-12-03 (×3): 0.5 mg via INTRAVENOUS
  Filled 2023-12-02 (×3): qty 0.5
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
## 2023-12-02 NOTE — Plan of Care (Signed)
  Problem: Clinical Measurements: Goal: Will remain free from infection Outcome: Progressing   Problem: Activity: Goal: Risk for activity intolerance will decrease Outcome: Progressing   Problem: Education: Goal: Knowledge of General Education information will improve Description: Including pain rating scale, medication(s)/side effects and non-pharmacologic comfort measures Outcome: Progressing   Problem: Health Behavior/Discharge Planning: Goal: Ability to manage health-related needs will improve Outcome: Progressing   Problem: Clinical Measurements: Goal: Ability to maintain clinical measurements within normal limits will improve Outcome: Progressing Goal: Will remain free from infection Outcome: Progressing Goal: Diagnostic test results will improve Outcome: Progressing Goal: Respiratory complications will improve Outcome: Progressing Goal: Cardiovascular complication will be avoided Outcome: Progressing   Problem: Activity: Goal: Risk for activity intolerance will decrease Outcome: Progressing   Problem: Nutrition: Goal: Adequate nutrition will be maintained Outcome: Progressing   Problem: Coping: Goal: Level of anxiety will decrease Outcome: Progressing   Problem: Elimination: Goal: Will not experience complications related to bowel motility Outcome: Progressing Goal: Will not experience complications related to urinary retention Outcome: Progressing   Problem: Pain Management: Goal: General experience of comfort will improve Outcome: Progressing   Problem: Safety: Goal: Ability to remain free from injury will improve Outcome: Progressing   Problem: Skin Integrity: Goal: Risk for impaired skin integrity will decrease Outcome: Progressing

## 2023-12-03 ENCOUNTER — Other Ambulatory Visit (HOSPITAL_COMMUNITY): Payer: Self-pay

## 2023-12-03 MED ORDER — METHOCARBAMOL 500 MG PO TABS
1000.0000 mg | ORAL_TABLET | Freq: Four times a day (QID) | ORAL | Status: DC
Start: 2023-12-03 — End: 2023-12-08
  Administered 2023-12-03 – 2023-12-08 (×21): 1000 mg via ORAL
  Filled 2023-12-03 (×21): qty 2

## 2023-12-03 MED ORDER — IBUPROFEN 600 MG PO TABS
600.0000 mg | ORAL_TABLET | Freq: Four times a day (QID) | ORAL | Status: AC
  Administered 2023-12-03 – 2023-12-08 (×20): 600 mg via ORAL
  Filled 2023-12-03 (×2): qty 3
  Filled 2023-12-03 (×3): qty 1
  Filled 2023-12-03 (×7): qty 3
  Filled 2023-12-03 (×2): qty 1
  Filled 2023-12-03 (×5): qty 3

## 2023-12-03 MED ORDER — HYDROMORPHONE HCL 1 MG/ML IJ SOLN
0.5000 mg | Freq: Three times a day (TID) | INTRAMUSCULAR | Status: DC | PRN
  Administered 2023-12-03 – 2023-12-06 (×6): 0.5 mg via INTRAVENOUS
  Filled 2023-12-03 (×7): qty 0.5

## 2023-12-03 MED ORDER — IBUPROFEN 200 MG PO TABS: 600.0000 mg | ORAL_TABLET | Freq: Four times a day (QID) | ORAL | Status: DC

## 2023-12-03 NOTE — Progress Notes (Signed)
PT Cancellation Note  Patient Details Name: Cole Henderson MRN: [id number] DOB: Jul 05, 1976   Cancelled Treatment:    Reason Eval/Treat Not Completed: Patient declined, no reason specified.  Pt stated he overdid it yesterday and ribs are hurting really bad today.  Will participate 12/31.12/03/2023  Jacinto Halim., PT Acute Rehabilitation Services [phone]  (office)   [name] 12/03/2023, 11:58 AM

## 2023-12-03 NOTE — Plan of Care (Signed)
  Problem: Clinical Measurements: Goal: Will remain free from infection Outcome: Progressing   Problem: Activity: Goal: Risk for activity intolerance will decrease Outcome: Progressing   Problem: Health Behavior/Discharge Planning: Goal: Ability to manage health-related needs will improve Outcome: Progressing
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
## 2023-12-04 LAB — CBC
HCT: 33.6 % — ABNORMAL LOW (ref 39.0–52.0)
Hemoglobin: 11.2 g/dL — ABNORMAL LOW (ref 13.0–17.0)
MCH: 28.1 pg (ref 26.0–34.0)
MCHC: 33.3 g/dL (ref 30.0–36.0)
MCV: 84.4 fL (ref 80.0–100.0)
Platelets: 349 10*3/uL (ref 150–400)
RBC: 3.98 MIL/uL — ABNORMAL LOW (ref 4.22–5.81)
RDW: 13.7 % (ref 11.5–15.5)
WBC: 8.1 10*3/uL (ref 4.0–10.5)
nRBC: 0 % (ref 0.0–0.2)

## 2023-12-04 LAB — BASIC METABOLIC PANEL
Anion gap: 8 (ref 5–15)
BUN: 17 mg/dL (ref 6–20)
CO2: 26 mmol/L (ref 22–32)
Calcium: 9 mg/dL (ref 8.9–10.3)
Chloride: 101 mmol/L (ref 98–111)
Creatinine, Ser: 0.6 mg/dL — ABNORMAL LOW (ref 0.61–1.24)
GFR, Estimated: >60 mL/min (ref >60)
Glucose, Bld: 124 mg/dL — ABNORMAL HIGH (ref 70–99)
Potassium: 4.7 mmol/L (ref 3.5–5.1)
Sodium: 135 mmol/L (ref 135–145)

## 2023-12-04 NOTE — Plan of Care (Signed)
  Problem: Clinical Measurements: Goal: Will remain free from infection Outcome: Not Progressing   Problem: Activity: Goal: Risk for activity intolerance will decrease Outcome: Not Progressing   Problem: Education: Goal: Knowledge of General Education information will improve Description: Including pain rating scale, medication(s)/side effects and non-pharmacologic comfort measures Outcome: Not Progressing   Problem: Health Behavior/Discharge Planning: Goal: Ability to manage health-related needs will improve Outcome: Not Progressing   Problem: Clinical Measurements: Goal: Ability to maintain clinical measurements within normal limits will improve Outcome: Not Progressing Goal: Will remain free from infection Outcome: Not Progressing Goal: Diagnostic test results will improve Outcome: Not Progressing Goal: Respiratory complications will improve Outcome: Not Progressing Goal: Cardiovascular complication will be avoided Outcome: Not Progressing

## 2023-12-04 NOTE — TOC Progression Note (Signed)
 Transition of Care [facility]) - Progression Note    Patient Details  Name: Cole Henderson MRN: [id number] Date of Birth: [birth year]  Transition of Care [facility]) CM/SW Contact  Rucker Pridgeon M, RN Phone Number: 12/04/2023, 4:11 PM  Clinical Narrative:    Left message for patient's mother, requesting call back about landlord's decision to let patient stay in her home.  Also provided phone number for her to call regarding switching [employer] back to standard Medicaid.   Information as provided by financial counseling:  To switch from a [employer] Tailored Plan back to regular Medicaid in [place]. Patient would need to contact [place] Medicaid Enrollment Broker @ [phone]. To request a change to a Standard Plan and explain that the patient no longer require the specialized services provided by the Tailored plan.  They need to Specify the request of switching to the Standard Plan and the Reason for the change. If they have any additional questions about the switch, they can contact the DSS Case Worker in the county where their Medicaid was approved in.     Barriers to Discharge: Continued Medical Work up  Expected Discharge Plan and Services   Discharge Planning Services: CM Consult   Living arrangements for the past 2 months: Homeless                                       Social Determinants of Health (SDOH) Interventions SDOH Screenings   Food Insecurity: No Food Insecurity (11/26/2023)  Recent Concern: Food Insecurity - High Risk (11/10/2023)   Received from [facility]  Housing: High Risk (11/26/2023)  Transportation Needs: No Transportation Needs (11/26/2023)  Recent Concern: Transportation Needs - High Risk (11/10/2023)   Received from [facility]  Utilities: Not At Risk (11/26/2023)  Alcohol Screen: Low Risk  (09/04/2022)  Depression (PHQ2-9): Medium Risk (12/01/2020)  Financial Resource Strain: High Risk (11/11/2023)   [facility] from  [facility]  Physical Activity: Inactive (04/14/2021)   Received from [facility], [facility]  Social Connections: Unknown (04/15/2022)   Received from [facility], [facility]  Stress: Stress Concern Present (04/14/2021)   Received from [facility], [facility]  Tobacco Use: High Risk (11/27/2023)  Health Literacy: Low Risk  (04/14/2021)   Received from [facility], [facility]    Readmission Risk Interventions     No data to display         Mliss MICAEL Fass, RN, BSN  Trauma/Neuro ICU Case Manager [phone]
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
## 2023-12-04 NOTE — Plan of Care (Signed)
  Problem: Clinical Measurements: Goal: Will remain free from infection Outcome: Progressing   Problem: Activity: Goal: Risk for activity intolerance will decrease Outcome: Progressing   Problem: Clinical Measurements: Goal: Ability to maintain clinical measurements within normal limits will improve Outcome: Progressing Goal: Will remain free from infection Outcome: Progressing Goal: Diagnostic test results will improve Outcome: Progressing Goal: Respiratory complications will improve Outcome: Progressing Goal: Cardiovascular complication will be avoided Outcome: Progressing   Problem: Activity: Goal: Risk for activity intolerance will decrease Outcome: Progressing

## 2023-12-05 LAB — BASIC METABOLIC PANEL
Anion gap: 10 (ref 5–15)
BUN: 18 mg/dL (ref 6–20)
CO2: 23 mmol/L (ref 22–32)
Calcium: 8.9 mg/dL (ref 8.9–10.3)
Chloride: 102 mmol/L (ref 98–111)
Creatinine, Ser: 0.6 mg/dL — ABNORMAL LOW (ref 0.61–1.24)
GFR, Estimated: >60 mL/min (ref >60)
Glucose, Bld: 113 mg/dL — ABNORMAL HIGH (ref 70–99)
Potassium: 4.6 mmol/L (ref 3.5–5.1)
Sodium: 135 mmol/L (ref 135–145)

## 2023-12-05 MED ORDER — MENTHOL 3 MG MT LOZG
1.0000 | LOZENGE | OROMUCOSAL | Status: DC | PRN
  Administered 2023-12-05: 3 mg via ORAL
  Filled 2023-12-05: qty 9
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
## 2023-12-05 NOTE — Plan of Care (Signed)
  Problem: Clinical Measurements: Goal: Will remain free from infection Outcome: Progressing   Problem: Activity: Goal: Risk for activity intolerance will decrease Outcome: Progressing   Problem: Education: Goal: Knowledge of General Education information will improve Description: Including pain rating scale, medication(s)/side effects and non-pharmacologic comfort measures Outcome: Progressing   Problem: Health Behavior/Discharge Planning: Goal: Ability to manage health-related needs will improve Outcome: Progressing   Problem: Clinical Measurements: Goal: Ability to maintain clinical measurements within normal limits will improve Outcome: Progressing Goal: Will remain free from infection Outcome: Progressing Goal: Diagnostic test results will improve Outcome: Progressing Goal: Respiratory complications will improve Outcome: Progressing Goal: Cardiovascular complication will be avoided Outcome: Progressing

## 2023-12-05 NOTE — Plan of Care (Signed)
  Problem: Clinical Measurements: Goal: Will remain free from infection Outcome: Progressing   Problem: Activity: Goal: Risk for activity intolerance will decrease Outcome: Progressing   Problem: Education: Goal: Knowledge of General Education information will improve Description: Including pain rating scale, medication(s)/side effects and non-pharmacologic comfort measures Outcome: Progressing   Problem: Health Behavior/Discharge Planning: Goal: Ability to manage health-related needs will improve Outcome: Progressing   Problem: Clinical Measurements: Goal: Ability to maintain clinical measurements within normal limits will improve Outcome: Progressing Goal: Will remain free from infection Outcome: Progressing Goal: Diagnostic test results will improve Outcome: Progressing Goal: Respiratory complications will improve Outcome: Progressing Goal: Cardiovascular complication will be avoided Outcome: Progressing   Problem: Activity: Goal: Risk for activity intolerance will decrease Outcome: Progressing   Problem: Nutrition: Goal: Adequate nutrition will be maintained Outcome: Progressing   Problem: Coping: Goal: Level of anxiety will decrease Outcome: Progressing

## 2023-12-06 LAB — BASIC METABOLIC PANEL
Anion gap: 11 (ref 5–15)
BUN: 15 mg/dL (ref 6–20)
CO2: 23 mmol/L (ref 22–32)
Calcium: 9.3 mg/dL (ref 8.9–10.3)
Chloride: 100 mmol/L (ref 98–111)
Creatinine, Ser: 0.7 mg/dL (ref 0.61–1.24)
GFR, Estimated: >60 mL/min (ref >60)
Glucose, Bld: 112 mg/dL — ABNORMAL HIGH (ref 70–99)
Potassium: 4.9 mmol/L (ref 3.5–5.1)
Sodium: 134 mmol/L — ABNORMAL LOW (ref 135–145)

## 2023-12-06 MED ORDER — HYDROMORPHONE HCL 1 MG/ML IJ SOLN
0.5000 mg | Freq: Two times a day (BID) | INTRAMUSCULAR | Status: DC | PRN
  Administered 2023-12-06 – 2023-12-07 (×2): 0.5 mg via INTRAVENOUS
  Filled 2023-12-06 (×2): qty 0.5

## 2023-12-06 MED ORDER — ORAL CARE MOUTH RINSE
15.0000 mL | OROMUCOSAL | Status: DC | PRN
Start: 2023-12-06 — End: 2023-12-08
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
## 2023-12-06 NOTE — Plan of Care (Signed)
  Problem: Clinical Measurements: Goal: Will remain free from infection Outcome: Progressing   Problem: Activity: Goal: Risk for activity intolerance will decrease Outcome: Progressing   Problem: Education: Goal: Knowledge of General Education information will improve Description: Including pain rating scale, medication(s)/side effects and non-pharmacologic comfort measures Outcome: Progressing   Problem: Health Behavior/Discharge Planning: Goal: Ability to manage health-related needs will improve Outcome: Progressing   Problem: Clinical Measurements: Goal: Ability to maintain clinical measurements within normal limits will improve Outcome: Progressing Goal: Will remain free from infection Outcome: Progressing Goal: Diagnostic test results will improve Outcome: Progressing Goal: Respiratory complications will improve Outcome: Progressing Goal: Cardiovascular complication will be avoided Outcome: Progressing   Problem: Activity: Goal: Risk for activity intolerance will decrease Outcome: Progressing   Problem: Nutrition: Goal: Adequate nutrition will be maintained Outcome: Progressing   Problem: Coping: Goal: Level of anxiety will decrease Outcome: Progressing   Problem: Elimination: Goal: Will not experience complications related to bowel motility Outcome: Progressing Goal: Will not experience complications related to urinary retention Outcome: Progressing   Problem: Pain Management: Goal: General experience of comfort will improve Outcome: Progressing   Problem: Safety: Goal: Ability to remain free from injury will improve Outcome: Progressing   Problem: Skin Integrity: Goal: Risk for impaired skin integrity will decrease Outcome: Progressing

## 2023-12-07 ENCOUNTER — Other Ambulatory Visit (HOSPITAL_COMMUNITY): Payer: Self-pay

## 2023-12-07 ENCOUNTER — Inpatient Hospital Stay (HOSPITAL_COMMUNITY): Payer: MEDICAID

## 2023-12-07 ENCOUNTER — Ambulatory Visit (HOSPITAL_COMMUNITY): Payer: MEDICAID | Admitting: Student

## 2023-12-07 LAB — GLUCOSE, CAPILLARY
Glucose-Capillary: 238 mg/dL — ABNORMAL HIGH (ref 70–99)
Glucose-Capillary: 127 mg/dL — ABNORMAL HIGH (ref 70–99)
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
## 2023-12-07 NOTE — TOC CM/SW Note (Signed)
    Durable Medical Equipment  (From admission, onward)           Start     Ordered   12/07/23 1353  For home use only DME standard manual wheelchair with seat cushion  Once       Comments: Patient suffers from Left knee fx , - Per Ortho. S/p OR 12/24 - ORIF L Distal femur (Handy);  NWB to LLE x 6 weeks  which impairs their ability to perform daily activities like ambulating  in the home.  A cane will not resolve issue with performing activities of daily living. A wheelchair will allow patient to safely perform daily activities. Patient can safely propel the wheelchair in the home or has a caregiver who can provide assistance. Length of need lifetime . Accessories: elevating leg rests (ELRs), wheel locks, extensions and anti-tippers.  Seat and back cushion   12/07/23 1353   12/07/23 1352  For home use only DME Walker rolling  Once       Question Answer Comment  Walker: With 5 Inch Wheels   Patient needs a walker to treat with the following condition Weakness      12/07/23 1353   12/07/23 1352  For home use only DME Bedside commode  Once       Comments: Left knee fx , - Per Ortho. S/p OR 12/24 - ORIF L Distal femur (Handy);  NWB to LLE x 6 weeks  Question:  Patient needs a bedside commode to treat with the following condition  Answer:  Weakness   12/07/23 1353             Patient confine to a room with no bathroom , Left knee fx , - Per Ortho. S/p OR 12/24 - ORIF L Distal femur (Handy);  NWB to LLE x 6 weeks therefore needs a bedside commode
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
## 2023-12-07 NOTE — TOC CM/SW Note (Addendum)
 Spoke to PA , patient , and patient's mother on speaker phone  at bedside.   Patient medically ready for discharge.   Patient's mother spoke to her landlord and patient is not allowed to stay with her at discharge. Patient and mother both state that there is no where else for him to stay.   Patient's mother spoke to [facility] and per patient's mother Brian will assist with housing for patient. However, patient's mother does not have Meg's last name or phone number. She will call NCM back after 2:30 pm today with information.   NCM trying to contact Meg at [employer] . CMA called unable to locate Meg    1345 Patient reporting he will go stay with a friend in [place] at discharge. Friend can pick him up tomorrow at the hospital , take him to his mom's to pick up his belongings and then drive him to [facility].   Patient does not have the [facility] address but will find out address and tell nurse.   Ordered walker, wheelchair and bedside commode with Zach with [employer] . Patient aware insurance will most likely not cover all DME and [employer] will give him a cost to private pay. Patient voiced understanding   [employer] cannot provide DME.   All DME ordered through Jermaine with [employer]

## 2023-12-08 ENCOUNTER — Other Ambulatory Visit (HOSPITAL_COMMUNITY): Payer: Self-pay

## 2023-12-08 MED ORDER — ACETAMINOPHEN 500 MG PO TABS: 1000.0000 mg | ORAL_TABLET | Freq: Four times a day (QID) | ORAL | Status: DC | PRN

## 2023-12-08 MED ORDER — GABAPENTIN 300 MG PO CAPS
300.0000 mg | ORAL_CAPSULE | Freq: Three times a day (TID) | ORAL | 0 refills | Status: AC
  Filled 2023-12-08: qty 90, 30d supply, fill #0

## 2023-12-08 MED ORDER — OXYCODONE HCL 10 MG PO TABS
10.0000 mg | ORAL_TABLET | ORAL | 0 refills | Status: DC | PRN
  Filled 2023-12-08: qty 30, 5d supply, fill #0

## 2023-12-08 MED ORDER — METHOCARBAMOL 500 MG PO TABS
1000.0000 mg | ORAL_TABLET | Freq: Four times a day (QID) | ORAL | 0 refills | Status: DC
  Filled 2023-12-08: qty 120, 15d supply, fill #0

## 2023-12-08 MED ORDER — POLYETHYLENE GLYCOL 3350 17 G PO PACK: 17.0000 g | PACK | Freq: Every day | ORAL | Status: DC | PRN

## 2023-12-08 NOTE — Plan of Care (Signed)
  Problem: Clinical Measurements: Goal: Will remain free from infection Outcome: Adequate for Discharge   Problem: Activity: Goal: Risk for activity intolerance will decrease Outcome: Adequate for Discharge   Problem: Education: Goal: Knowledge of General Education information will improve Description: Including pain rating scale, medication(s)/side effects and non-pharmacologic comfort measures Outcome: Adequate for Discharge   Problem: Health Behavior/Discharge Planning: Goal: Ability to manage health-related needs will improve Outcome: Adequate for Discharge   Problem: Clinical Measurements: Goal: Ability to maintain clinical measurements within normal limits will improve Outcome: Adequate for Discharge Goal: Will remain free from infection Outcome: Adequate for Discharge Goal: Diagnostic test results will improve Outcome: Adequate for Discharge Goal: Respiratory complications will improve Outcome: Adequate for Discharge Goal: Cardiovascular complication will be avoided Outcome: Adequate for Discharge   Problem: Activity: Goal: Risk for activity intolerance will decrease Outcome: Adequate for Discharge   Problem: Nutrition: Goal: Adequate nutrition will be maintained Outcome: Adequate for Discharge   Problem: Coping: Goal: Level of anxiety will decrease Outcome: Adequate for Discharge   Problem: Elimination: Goal: Will not experience complications related to bowel motility Outcome: Adequate for Discharge Goal: Will not experience complications related to urinary retention Outcome: Adequate for Discharge   Problem: Pain Management: Goal: General experience of comfort will improve Outcome: Adequate for Discharge   Problem: Safety: Goal: Ability to remain free from injury will improve Outcome: Adequate for Discharge   Problem: Skin Integrity: Goal: Risk for impaired skin integrity will decrease Outcome: Adequate for Discharge

## 2023-12-08 NOTE — TOC Transition Note (Signed)
 Transition of Care [facility]) - Discharge Note   Patient Details  Name: Cole Henderson MRN: [id number] Date of Birth: 11/17/1976  Transition of Care [facility]) CM/SW Contact:  Robynn Eileen Hoose, RN Phone Number: 12/08/2023, 11:59 AM   Clinical Narrative:    Patient being discharged today. Secure message received from floor nurse to report that patient does not have his ordered DME. Call to Jermaine with [employer], DME to be delivered today.     Barriers to Discharge: Continued Medical Work up   Patient Goals and CMS Choice            Discharge Placement                       Discharge Plan and Services Additional resources added to the After Visit Summary for     Discharge Planning Services: CM Consult                                 Social Drivers of Health (SDOH) Interventions SDOH Screenings   Food Insecurity: No Food Insecurity (11/26/2023)  Recent Concern: Food Insecurity - High Risk (11/10/2023)   Received from [facility]  Housing: High Risk (11/26/2023)  Transportation Needs: No Transportation Needs (11/26/2023)  Recent Concern: Transportation Needs - High Risk (11/10/2023)   Received from [facility]  Utilities: Not At Risk (11/26/2023)  Alcohol Screen: Low Risk  (09/04/2022)  Depression (PHQ2-9): Medium Risk (12/01/2020)  Financial Resource Strain: High Risk (11/11/2023)   Received from [facility]  Physical Activity: Inactive (04/14/2021)   Received from [facility], [facility]  Social Connections: Unknown (04/15/2022)   Received from [facility], [facility]  Stress: Stress Concern Present (04/14/2021)   Received from [facility], [facility]  Tobacco Use: High Risk (11/27/2023)  Health Literacy: Low Risk  (04/14/2021)   Received from [facility], [facility]     Readmission Risk Interventions     No data to display

## 2023-12-08 NOTE — TOC Transition Note (Addendum)
 Transition of Care [facility]) - Discharge Note   Patient Details  Name: Cole Henderson MRN: [id number] Date of Birth: [birth year]  [facility]) CM/SW Contact:  Darin JULIANNA Bend, LCSW Phone Number: 12/08/2023, 2:54 PM   Clinical Narrative:    CSW was contacted by the Charge Nurse to support the patient's discharge by way of transportation [facility] Voucher).   This clinical research associate met with the patient at the bedside, reviewed the disposition support bed. The patient reported no other way of getting home or support to get home.   CSW provided the patient with the bus-pass and assessed if there any additional needs. The patient reported being ok with the support proivded to get home.   No other needs identified of this clinical research associate. Please contact TOC if there is additional     Final next level of care: Home/Self Care Barriers to Discharge: No Barriers Identified   Patient Goals and CMS Choice            Discharge Placement                       Discharge Plan and Services Additional resources added to the After Visit Summary for     Discharge Planning Services: CM Consult                                 Social Drivers of Health (SDOH) Interventions SDOH Screenings   Food Insecurity: No Food Insecurity (11/26/2023)  Recent Concern: Food Insecurity - High Risk (11/10/2023)   Received from [facility]  Housing: High Risk (11/26/2023)  Transportation Needs: No Transportation Needs (11/26/2023)  Recent Concern: Transportation Needs - High Risk (11/10/2023)   Received from [facility]  Utilities: Not At Risk (11/26/2023)  Alcohol Screen: Low Risk  (09/04/2022)  Depression (PHQ2-9): Medium Risk (12/01/2020)  Financial Resource Strain: High Risk (11/11/2023)   Received from [facility]  Physical Activity: Inactive (04/14/2021)   Received from [facility], [facility]  Social Connections: Unknown (04/15/2022)   Received  from [facility], [facility]  Stress: Stress Concern Present (04/14/2021)   Received from [facility], [facility]  Tobacco Use: High Risk (11/27/2023)  Health Literacy: Low Risk  (04/14/2021)   Received from [facility], [facility]     Readmission Risk Interventions     No data to display
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
## 2023-12-08 NOTE — Progress Notes (Signed)
 Francis Dues to be D/C'd  per MD order.  Discussed with the patient and all questions fully answered.  VSS, Skin clean, dry and intact without evidence of skin break down, no evidence of skin tears noted.  IV catheter discontinued intact. Site without signs and symptoms of complications. Dressing and pressure applied.  An After Visit Summary was printed and given to the patient. Patient received prescription from [facility]. Also, received all DME, wheelchair, rolling walker and BSC.  D/c education completed with patient/family including follow up instructions, medication list, d/c activities limitations if indicated, with other d/c instructions as indicated by MD - patient able to verbalize understanding, all questions fully answered. Signed copy of AVS awaiting to be scanned into pt's chart by medical records. Darin Bend, LCSW, delivered taxi voucher to pt.   Patient instructed to return to ED, call 911, or call MD for any changes in condition.   Patient to be escorted via WC, and D/C home via [facility].

## 2023-12-12 ENCOUNTER — Other Ambulatory Visit: Payer: Self-pay

## 2023-12-12 ENCOUNTER — Emergency Department (HOSPITAL_COMMUNITY): Payer: MEDICAID

## 2023-12-12 ENCOUNTER — Emergency Department (HOSPITAL_COMMUNITY)
Admission: EM | Admit: 2023-12-12 | Discharge: 2023-12-12 | Disposition: A | Discharge status: Home / Self Care | Payer: MEDICAID | Attending: Emergency Medicine | Admitting: Emergency Medicine

## 2023-12-12 ENCOUNTER — Encounter (HOSPITAL_COMMUNITY): Payer: Self-pay

## 2023-12-12 DIAGNOSIS — W050XXA Fall from non-moving wheelchair, initial encounter: Secondary | ICD-10-CM | POA: Insufficient documentation

## 2023-12-12 DIAGNOSIS — M25462 Effusion, left knee: Secondary | ICD-10-CM | POA: Diagnosis not present

## 2023-12-12 DIAGNOSIS — M25562 Pain in left knee: Secondary | ICD-10-CM | POA: Diagnosis present

## 2023-12-12 DIAGNOSIS — Z7901 Long term (current) use of anticoagulants: Secondary | ICD-10-CM | POA: Insufficient documentation

## 2023-12-12 DIAGNOSIS — W19XXXA Unspecified fall, initial encounter: Secondary | ICD-10-CM

## 2023-12-12 MED ORDER — OXYCODONE-ACETAMINOPHEN 5-325 MG PO TABS
1.0000 | ORAL_TABLET | Freq: Once | ORAL | Status: AC
  Administered 2023-12-12: 1 via ORAL
  Filled 2023-12-12: qty 1

## 2023-12-12 MED ORDER — ACETAMINOPHEN 500 MG PO TABS
1000.0000 mg | ORAL_TABLET | Freq: Once | ORAL | Status: AC
  Administered 2023-12-12: 1000 mg via ORAL
  Filled 2023-12-12: qty 2

## 2023-12-12 NOTE — ED Provider Triage Note (Signed)
 Emergency Medicine Provider Triage Evaluation Note  Cole Henderson , a 48 y.o. male  was evaluated in triage.  Pt complains of left knee pain. The patient reports that he was rolling backwards in his wheelchair and fell out landing on his knee. Denies hitting his head or any other injury. Reports some swelling  Review of Systems  Positive:  Negative:   Physical Exam  BP (!) 150/97 (BP Location: Left Arm)   Pulse (!) 116   Temp (!) 97.4 F (36.3 C) (Oral)   Resp 20   Ht 6' (1.829 m)   Wt 99.8 kg   SpO2 100%   BMI 29.84 kg/m  Gen:   Awake, no distress   Resp:  Normal effort  MSK:   Moves extremities without difficulty  Other:  Some swelling noted. Surgical incision/scar intact. Some soft swelling present without color change. Diffuse anterior knee tenderness. Color symmetric. Compartments are soft.   Medical Decision Making  Medically screening exam initiated at 4:46 AM.  Appropriate orders placed.  Cole Henderson was informed that the remainder of the evaluation will be completed by another provider, this initial triage assessment does not replace that evaluation, and the importance of remaining in the ED until their evaluation is complete.  XR ordered.    Cole Henderson, [place] 12/12/23 [phone]
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
## 2023-12-12 NOTE — ED Notes (Signed)
 Per Rhina Brackett, MSE completed and pt may go to main [facility] lobby.
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
## 2023-12-12 NOTE — Discharge Instructions (Signed)
 Call and follow-up with the orthopedic surgeon to let them know about your fall and for reassessment. Use Tylenol every 4 hours and ice regularly for pain.  You cannot take Tylenol at the same time as Percocet since Percocet has Tylenol in it.

## 2023-12-12 NOTE — ED Provider Notes (Signed)
 [place] EMERGENCY DEPARTMENT AT  HOSPITAL Provider Note   CSN: [id number] Arrival date & time: 12/12/23  9562     History  Chief Complaint  Patient presents with   Left Knee Injury    Cole Henderson is a 48 y.o. male.  Patient presents with left knee pain when he was rolling his wheelchair and fell backwards.  No head injury syncope or other injuries.  Pain left knee with movement.  Patient recently had surgery for fracture with Dr. Celena.  Patient is on anticoagulant.  The history is provided by the patient.       Home Medications Prior to Admission medications   Medication Sig Start Date End Date Taking? Authorizing Provider  acetaminophen  (TYLENOL ) 500 MG tablet Take 2 tablets (1,000 mg total) by mouth every 6 (six) hours as needed for headache, mild pain (pain score 1-3) or fever. 12/08/23   Vicci Burnard SAUNDERS, PA-C  amLODipine  (NORVASC ) 5 MG tablet Take 1 tablet (5 mg total) by mouth daily. 02/01/23 03/03/23  Pahwani, Ravi, MD  amphetamine-dextroamphetamine (ADDERALL) 10 MG tablet Take 30 mg by mouth 2 (two) times daily. 10/04/23   [provider]  apixaban  (ELIQUIS ) 2.5 MG TABS tablet Take 1 tablet (2.5 mg total) by mouth 2 (two) times daily. 11/29/23 12/29/23  Deward Francis, PA-C  ARIPiprazole  ER (ABILIFY  MAINTENA) 400 MG SRER injection Inject 400 mg into the muscle every 28 (twenty-eight) days.    [provider]  gabapentin  (NEURONTIN ) 300 MG capsule Take 1 capsule (300 mg total) by mouth 3 (three) times daily. 12/08/23   Vicci Burnard SAUNDERS, PA-C  meloxicam (MOBIC) 15 MG tablet Take 15 mg by mouth daily. 09/05/23   [provider]  methocarbamol  (ROBAXIN ) 500 MG tablet Take 2 tablets (1,000 mg total) by mouth 4 (four) times daily. 12/08/23   Vicci Burnard SAUNDERS, PA-C  Oxycodone  HCl 10 MG TABS Take 1-1.5 tablets (10-15 mg total) by mouth every 4 (four) hours as needed for severe pain (pain score 7-10) or moderate pain (pain score 4-6). 12/08/23   Vicci Burnard SAUNDERS, PA-C  pantoprazole  (PROTONIX ) 40 MG tablet Take 40 mg by mouth daily. 09/05/23   [provider]  polyethylene glycol (MIRALAX  / GLYCOLAX ) 17 g packet Take 17 g by mouth daily as needed for mild constipation. 12/08/23   Vicci Burnard SAUNDERS, PA-C  sertraline  (ZOLOFT ) 50 MG tablet Take 1 tablet (50 mg total) by mouth daily for 15 days. 10/23/23 11/26/23  Waymond Sieving, MD  traZODone  (DESYREL ) 150 MG tablet Take 1 tablet (150 mg total) by mouth at bedtime for 15 days. 10/22/23 11/06/23  Waymond Sieving, MD  trihexyphenidyl  (ARTANE ) 5 MG tablet Take 1 tablet (5 mg total) by mouth 3 (three) times daily. Patient taking differently: Take 5 mg by mouth 3 (three) times daily with meals. 09/10/22   Evelena Figures, MD      Allergies    Fish allergy, Haloperidol  lactate, Shellfish allergy, Cephalexin, Cogentin [benztropine], Ingrezza [valbenazine tosylate], Risperidone and related, Strawberry extract, and Geodon  [ziprasidone ]    Review of Systems   Review of Systems  Constitutional:  Negative for chills and fever.  HENT:  Negative for congestion.   Eyes:  Negative for visual disturbance.  Respiratory:  Negative for shortness of breath.   Cardiovascular:  Negative for chest pain.  Gastrointestinal:  Negative for abdominal pain and vomiting.  Genitourinary:  Negative for dysuria and flank pain.  Musculoskeletal:  Positive for gait problem and joint swelling. Negative for  back pain, neck pain and neck stiffness.  Skin:  Negative for rash.  Neurological:  Negative for light-headedness and headaches.    Physical Exam Updated Vital Signs BP (!) 170/101   Pulse (!) 109   Temp 98.1 F (36.7 C) (Oral)   Resp 20   Ht 6' (1.829 m)   Wt 99.8 kg   SpO2 100%   BMI 29.84 kg/m  Physical Exam Vitals and nursing note reviewed.  Constitutional:      General: He is not in acute distress.    Appearance: He is well-developed.  HENT:     Head: Normocephalic and atraumatic.     Mouth/Throat:     Mouth:  Mucous membranes are moist.  Eyes:     General:        Right eye: No discharge.        Left eye: No discharge.     Conjunctiva/sclera: Conjunctivae normal.  Neck:     Trachea: No tracheal deviation.  Cardiovascular:     Rate and Rhythm: Normal rate.  Pulmonary:     Effort: Pulmonary effort is normal.  Abdominal:     General: There is no distension.     Palpations: Abdomen is soft.     Tenderness: There is no abdominal tenderness. There is no guarding.  Musculoskeletal:        General: Swelling and tenderness present. No deformity.     Cervical back: Normal range of motion and neck supple. No rigidity.     Comments: Patient has tenderness and joint effusion medial aspect and anterior distal femur.  Surgical scar clean no signs of infection.  Patient has discomfort with palpation and flexion.  Leg and thigh compartments soft.  No proximal or distal tenderness to the area.  Skin:    General: Skin is warm.     Capillary Refill: Capillary refill takes less than 2 seconds.     Findings: No rash.  Neurological:     General: No focal deficit present.     Mental Status: He is alert.  Psychiatric:        Mood and Affect: Mood normal.     ED Results / Procedures / Treatments   Labs (all labs ordered are listed, but only abnormal results are displayed) Labs Reviewed - No data to display  EKG None  Radiology DG Knee Complete 4 Views Left Result Date: 12/12/2023 CLINICAL DATA:  Status post ORIF 11/27/2023. Recent fall. Knee pain. EXAM: LEFT KNEE - COMPLETE 4+ VIEW COMPARISON:  11/26/2023.  12/07/2023 FINDINGS: Status post ORIF. Cannulated compression screws are seen in the distal femoral condyles, transfixing the comminuted medial femoral condylar fracture. Medial plate and screw fixation device noted distal femoral metaphysis. Comparing to 12/07/2023, no interval change in position of fracture fragments. No hardware complication evident. Joint effusion again noted. Hypertrophic spurring  is visible in all 3 compartments. IMPRESSION: 1. Status post ORIF of comminuted medial femoral condylar fracture without hardware complication. No interval change in position of fracture fragments. 2. Joint effusion. 3. Tricompartmental degenerative changes. Electronically Signed   By: Camellia Candle M.D.   On: 12/12/2023 06:19    Procedures Procedures    Medications Ordered in ED Medications  acetaminophen  (TYLENOL ) tablet 1,000 mg (has no administration in time range)  oxyCODONE -acetaminophen  (PERCOCET/ROXICET) 5-325 MG per tablet 1 tablet (1 tablet Oral Given 12/12/23 0447)    ED Course/ Medical Decision Making/ A&P  Medical Decision Making Risk OTC drugs.   Patient presents after mechanical fall and isolated injury to left knee for which she recently surgery.  X-ray ordered independently reviewed and radiology results showing fracture fragments aligned.  Discussed follow-up with surgeon.  Tylenol  ordered for pain.        Final Clinical Impression(s) / ED Diagnoses Final diagnoses:  Knee effusion, left  Fall, initial encounter    Rx / DC Orders ED Discharge Orders     None         Tonia Chew, MD 12/12/23 1026

## 2023-12-13 ENCOUNTER — Ambulatory Visit (HOSPITAL_COMMUNITY)
Admission: EM | Admit: 2023-12-13 | Discharge: 2023-12-13 | Disposition: A | Discharge status: Home / Self Care | Payer: MEDICAID | Attending: Behavioral Health | Admitting: Behavioral Health

## 2023-12-13 DIAGNOSIS — F209 Schizophrenia, unspecified: Secondary | ICD-10-CM | POA: Insufficient documentation

## 2023-12-13 DIAGNOSIS — Z79899 Other long term (current) drug therapy: Secondary | ICD-10-CM | POA: Insufficient documentation

## 2023-12-13 DIAGNOSIS — G4700 Insomnia, unspecified: Secondary | ICD-10-CM | POA: Insufficient documentation

## 2023-12-13 DIAGNOSIS — Z760 Encounter for issue of repeat prescription: Secondary | ICD-10-CM | POA: Insufficient documentation

## 2023-12-13 DIAGNOSIS — G2401 Drug induced subacute dyskinesia: Secondary | ICD-10-CM | POA: Insufficient documentation
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
## 2023-12-13 NOTE — ED Notes (Signed)
 Pt d/c & walked out by provider. Safety maintained.

## 2023-12-13 NOTE — Discharge Instructions (Addendum)
Discharge recommendations:   Medications: Patient is to take medications as prescribed. The patient or patient's guardian is to contact a medical professional and/or outpatient provider to address any new side effects that develop. The patient or the patient's guardian should update outpatient providers of any new medications and/or medication changes.   Outpatient Follow up: Please review list of outpatient resources for psychiatry and counseling. Please follow up with your primary care provider for all medical related needs.  You are encouraged to follow up with [facility] for outpatient treatment.  Walk in/ Open Access Hours: Monday - Friday 8AM - 11AM (To see provider and therapist) Friday - 1PM - 4PM (To see therapist only)  [facility] [address] [place], [place] [phone]  Therapy: We recommend that patient participate in individual therapy to address mental health concerns.  Atypical antipsychotics: If you are prescribed an atypical antipsychotic, it is recommended that your height, weight, BMI, blood pressure, fasting lipid panel, and fasting blood sugar be monitored by your outpatient providers.  Safety:   The following safety precautions should be taken:   No sharp objects. This includes scissors, razors, scrapers, and putty knives.   Chemicals should be removed and locked up.   Medications should be removed and locked up.   Weapons should be removed and locked up. This includes firearms, knives and instruments that can be used to cause injury.   The patient should abstain from use of illicit substances/drugs and abuse of any medications.  If symptoms worsen or do not continue to improve or if the patient becomes actively suicidal or homicidal then it is recommended that the patient return to the closest hospital emergency department, the [facility], or call 911 for further evaluation and  treatment. National Suicide Prevention Lifeline 1-800-SUICIDE or 1-800-273-8255.  About 988 988 offers 24/7 access to trained crisis counselors who can help people experiencing mental health-related distress. People can call or text 988 or chat 988lifeline.org for themselves or if they are worried about a loved one who may need crisis support.     

## 2023-12-14 ENCOUNTER — Other Ambulatory Visit (HOSPITAL_COMMUNITY): Payer: Self-pay | Admitting: Physician Assistant

## 2023-12-14 ENCOUNTER — Ambulatory Visit (INDEPENDENT_AMBULATORY_CARE_PROVIDER_SITE_OTHER): Payer: MEDICAID | Admitting: Physician Assistant

## 2023-12-14 VITALS — BP 156/113 | HR 127 | Temp 98.5°F | Ht 72.0 in

## 2023-12-14 DIAGNOSIS — F411 Generalized anxiety disorder: Secondary | ICD-10-CM | POA: Diagnosis not present

## 2023-12-14 DIAGNOSIS — F200 Paranoid schizophrenia: Secondary | ICD-10-CM | POA: Diagnosis not present

## 2023-12-14 DIAGNOSIS — Z758 Other problems related to medical facilities and other health care: Secondary | ICD-10-CM

## 2023-12-14 DIAGNOSIS — F5105 Insomnia due to other mental disorder: Secondary | ICD-10-CM | POA: Diagnosis not present

## 2023-12-14 DIAGNOSIS — F99 Mental disorder, not otherwise specified: Secondary | ICD-10-CM

## 2023-12-14 MED ORDER — TRIHEXYPHENIDYL HCL 5 MG PO TABS: 5.0000 mg | ORAL_TABLET | Freq: Three times a day (TID) | ORAL | 1 refills | Status: DC

## 2023-12-14 MED ORDER — SERTRALINE HCL 50 MG PO TABS: ORAL_TABLET | ORAL | 1 refills | Status: DC

## 2023-12-14 MED ORDER — TRAZODONE HCL 150 MG PO TABS: 150.0000 mg | ORAL_TABLET | Freq: Every day | ORAL | 0 refills | Status: DC

## 2023-12-14 MED ORDER — ABILIFY MAINTENA 400 MG IM SRER: 400.0000 mg | INTRAMUSCULAR | 2 refills | Status: AC
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
## 2023-12-17 ENCOUNTER — Encounter (HOSPITAL_COMMUNITY): Payer: Self-pay | Admitting: Physician Assistant

## 2023-12-21 ENCOUNTER — Other Ambulatory Visit (HOSPITAL_COMMUNITY): Payer: Self-pay | Admitting: Physician Assistant

## 2023-12-21 DIAGNOSIS — F5105 Insomnia due to other mental disorder: Secondary | ICD-10-CM

## 2023-12-26 ENCOUNTER — Ambulatory Visit (HOSPITAL_COMMUNITY): Payer: PRIVATE HEALTH INSURANCE

## 2024-01-01 ENCOUNTER — Telehealth (HOSPITAL_COMMUNITY): Payer: Self-pay

## 2024-01-01 NOTE — Telephone Encounter (Signed)
Pt called stating he wants to re-book appointment missed shot this past Wednesday 1/22.

## 2024-01-05 ENCOUNTER — Other Ambulatory Visit (HOSPITAL_COMMUNITY): Payer: Self-pay | Admitting: Physician Assistant

## 2024-01-05 DIAGNOSIS — F411 Generalized anxiety disorder: Secondary | ICD-10-CM

## 2024-01-28 ENCOUNTER — Emergency Department (HOSPITAL_COMMUNITY)
Admission: EM | Admit: 2024-01-28 | Discharge: 2024-01-28 | Discharge status: Against Medical Advice | Payer: MEDICAID | Attending: Emergency Medicine | Admitting: Emergency Medicine

## 2024-01-28 ENCOUNTER — Encounter (HOSPITAL_COMMUNITY): Payer: Self-pay

## 2024-01-28 ENCOUNTER — Other Ambulatory Visit: Payer: Self-pay

## 2024-01-28 DIAGNOSIS — R443 Hallucinations, unspecified: Secondary | ICD-10-CM | POA: Insufficient documentation

## 2024-01-28 DIAGNOSIS — Z5321 Procedure and treatment not carried out due to patient leaving prior to being seen by health care provider: Secondary | ICD-10-CM | POA: Insufficient documentation

## 2024-01-28 NOTE — ED Triage Notes (Addendum)
 Pt BIB [facility] with reports of hallucinations pt states that he hasn't been taking his medications. Pt states that he has not slept in 9 days. Pt reports recently using meth.

## 2024-01-30 ENCOUNTER — Telehealth: Payer: Self-pay

## 2024-01-30 NOTE — Telephone Encounter (Signed)
 Patient was called to advise that we do not do comprehensive clinical assessment, we can send a referral to a psychiatrist when he arrives for  his appt on 02/28/2024  or they can reach out to behavioral health.

## 2024-02-01 ENCOUNTER — Encounter (HOSPITAL_COMMUNITY): Payer: PRIVATE HEALTH INSURANCE | Admitting: Physician Assistant

## 2024-02-12 ENCOUNTER — Ambulatory Visit (HOSPITAL_COMMUNITY)
Admission: EM | Admit: 2024-02-12 | Discharge: 2024-02-13 | Disposition: A | Discharge status: Other Acute Inpatient Hospital | Attending: Psychiatry | Admitting: Psychiatry

## 2024-02-12 DIAGNOSIS — G2401 Drug induced subacute dyskinesia: Secondary | ICD-10-CM | POA: Diagnosis not present

## 2024-02-12 DIAGNOSIS — I10 Essential (primary) hypertension: Secondary | ICD-10-CM | POA: Insufficient documentation

## 2024-02-12 DIAGNOSIS — F411 Generalized anxiety disorder: Secondary | ICD-10-CM | POA: Diagnosis not present

## 2024-02-12 DIAGNOSIS — Z5900 Homelessness unspecified: Secondary | ICD-10-CM | POA: Insufficient documentation

## 2024-02-12 DIAGNOSIS — F15151 Other stimulant abuse with stimulant-induced psychotic disorder with hallucinations: Secondary | ICD-10-CM | POA: Diagnosis not present

## 2024-02-12 DIAGNOSIS — Z91148 Patient's other noncompliance with medication regimen for other reason: Secondary | ICD-10-CM | POA: Diagnosis not present

## 2024-02-12 DIAGNOSIS — R443 Hallucinations, unspecified: Secondary | ICD-10-CM

## 2024-02-12 DIAGNOSIS — F200 Paranoid schizophrenia: Secondary | ICD-10-CM | POA: Diagnosis not present

## 2024-02-12 DIAGNOSIS — Z79899 Other long term (current) drug therapy: Secondary | ICD-10-CM | POA: Diagnosis not present

## 2024-02-12 LAB — COMPREHENSIVE METABOLIC PANEL
ALT: 24 U/L (ref 0–44)
AST: 24 U/L (ref 15–41)
Albumin: 4.1 g/dL (ref 3.5–5.0)
Alkaline Phosphatase: 74 U/L (ref 38–126)
Anion gap: 12 (ref 5–15)
BUN: 22 mg/dL — ABNORMAL HIGH (ref 6–20)
CO2: 22 mmol/L (ref 22–32)
Calcium: 8.7 mg/dL — ABNORMAL LOW (ref 8.9–10.3)
Chloride: 101 mmol/L (ref 98–111)
Creatinine, Ser: 0.81 mg/dL (ref 0.61–1.24)
GFR, Estimated: >60 mL/min (ref >60)
Glucose, Bld: 97 mg/dL (ref 70–99)
Potassium: 3.6 mmol/L (ref 3.5–5.1)
Sodium: 135 mmol/L (ref 135–145)
Total Bilirubin: 0.6 mg/dL (ref 0.0–1.2)
Total Protein: 6.8 g/dL (ref 6.5–8.1)

## 2024-02-12 LAB — CBC WITH DIFFERENTIAL/PLATELET
Abs Immature Granulocytes: 0.02 10*3/uL (ref 0.00–0.07)
Basophils Absolute: 0.1 10*3/uL (ref 0.0–0.1)
Basophils Relative: 1 %
Eosinophils Absolute: 0.4 10*3/uL (ref 0.0–0.5)
Eosinophils Relative: 5 %
HCT: 35.6 % — ABNORMAL LOW (ref 39.0–52.0)
Hemoglobin: 11.8 g/dL — ABNORMAL LOW (ref 13.0–17.0)
Immature Granulocytes: 0 %
Lymphocytes Relative: 31 %
Lymphs Abs: 2.2 10*3/uL (ref 0.7–4.0)
MCH: 28.1 pg (ref 26.0–34.0)
MCHC: 33.1 g/dL (ref 30.0–36.0)
MCV: 84.8 fL (ref 80.0–100.0)
Monocytes Absolute: 0.6 10*3/uL (ref 0.1–1.0)
Monocytes Relative: 8 %
Neutro Abs: 3.7 10*3/uL (ref 1.7–7.7)
Neutrophils Relative %: 55 %
Platelets: 301 10*3/uL (ref 150–400)
RBC: 4.2 MIL/uL — ABNORMAL LOW (ref 4.22–5.81)
RDW: 14 % (ref 11.5–15.5)
WBC: 6.9 10*3/uL (ref 4.0–10.5)
nRBC: 0 % (ref 0.0–0.2)

## 2024-02-12 LAB — LIPID PANEL
Cholesterol: 134 mg/dL (ref 0–200)
HDL: 72 mg/dL (ref >40)
LDL Cholesterol: 55 mg/dL (ref 0–99)
Total CHOL/HDL Ratio: 1.9 ratio
Triglycerides: 33 mg/dL (ref <150)
VLDL: 7 mg/dL (ref 0–40)

## 2024-02-12 LAB — TSH: TSH: 1.321 u[IU]/mL (ref 0.350–4.500)

## 2024-02-12 LAB — ETHANOL: Alcohol, Ethyl (B): <10 mg/dL (ref <10)

## 2024-02-12 LAB — MAGNESIUM: Magnesium: 2.3 mg/dL (ref 1.7–2.4)

## 2024-02-12 LAB — HEMOGLOBIN A1C
Hgb A1c MFr Bld: 5.3 % (ref 4.8–5.6)
Mean Plasma Glucose: 105.41 mg/dL

## 2024-02-12 MED ORDER — MAGNESIUM HYDROXIDE 400 MG/5ML PO SUSP: 30.0000 mL | Freq: Every day | ORAL | Status: DC | PRN

## 2024-02-12 MED ORDER — ACETAMINOPHEN 325 MG PO TABS
650.0000 mg | ORAL_TABLET | Freq: Four times a day (QID) | ORAL | Status: DC | PRN
  Administered 2024-02-12: 650 mg via ORAL
  Filled 2024-02-12: qty 2

## 2024-02-12 MED ORDER — OLANZAPINE 10 MG IM SOLR
10.0000 mg | Freq: Three times a day (TID) | INTRAMUSCULAR | Status: DC | PRN
  Administered 2024-02-13: 10 mg via INTRAMUSCULAR
  Filled 2024-02-12: qty 10

## 2024-02-12 MED ORDER — OLANZAPINE 10 MG IM SOLR: 5.0000 mg | Freq: Three times a day (TID) | INTRAMUSCULAR | Status: DC | PRN

## 2024-02-12 MED ORDER — ALUM & MAG HYDROXIDE-SIMETH 200-200-20 MG/5ML PO SUSP: 30.0000 mL | ORAL | Status: DC | PRN

## 2024-02-12 MED ORDER — OLANZAPINE 5 MG PO TBDP: 5.0000 mg | ORAL_TABLET | Freq: Three times a day (TID) | ORAL | Status: DC | PRN

## 2024-02-12 NOTE — ED Notes (Signed)
 PRN Tylenol given due to patient reports of pain in L knee rating "8.5/10". Medication administered with no complications. Environment secured, safety checks in place per facility policy.
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
## 2024-02-12 NOTE — ED Notes (Signed)
 Patient resting quietly in bed with eyes closed, Respirations equal and unlabored, skin warm and dry, NAD. Routine safety checks conducted according to facility protocol. Will continue to monitor for safety.
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
## 2024-02-13 DIAGNOSIS — F15151 Other stimulant abuse with stimulant-induced psychotic disorder with hallucinations: Secondary | ICD-10-CM | POA: Diagnosis not present

## 2024-02-13 DIAGNOSIS — R443 Hallucinations, unspecified: Secondary | ICD-10-CM

## 2024-02-13 LAB — URINALYSIS, ROUTINE W REFLEX MICROSCOPIC
Bilirubin Urine: NEGATIVE
Glucose, UA: NEGATIVE mg/dL
Hgb urine dipstick: NEGATIVE
Ketones, ur: NEGATIVE mg/dL
Leukocytes,Ua: NEGATIVE
Nitrite: NEGATIVE
Protein, ur: NEGATIVE mg/dL
Specific Gravity, Urine: 1.016 (ref 1.005–1.030)
pH: 6 (ref 5.0–8.0)

## 2024-02-13 LAB — POCT URINE DRUG SCREEN - MANUAL ENTRY (I-SCREEN)
POC Amphetamine UR: POSITIVE — AB
POC Buprenorphine (BUP): NOT DETECTED
POC Cocaine UR: NOT DETECTED
POC Marijuana UR: NOT DETECTED
POC Methadone UR: NOT DETECTED
POC Methamphetamine UR: POSITIVE — AB
POC Morphine: NOT DETECTED
POC Oxazepam (BZO): NOT DETECTED
POC Oxycodone UR: NOT DETECTED
POC Secobarbital (BAR): NOT DETECTED

## 2024-02-13 LAB — RPR: RPR Ser Ql: NONREACTIVE

## 2024-02-13 MED ORDER — AMLODIPINE BESYLATE 5 MG PO TABS
5.0000 mg | ORAL_TABLET | Freq: Every day | ORAL | Status: DC
  Administered 2024-02-13: 5 mg via ORAL
  Filled 2024-02-13: qty 1

## 2024-02-13 MED ORDER — SERTRALINE HCL 100 MG PO TABS
100.0000 mg | ORAL_TABLET | Freq: Every day | ORAL | Status: DC
  Administered 2024-02-13: 100 mg via ORAL
  Filled 2024-02-13: qty 1

## 2024-02-13 MED ORDER — BUPRENORPHINE HCL-NALOXONE HCL 8-2 MG SL SUBL
1.0000 | SUBLINGUAL_TABLET | Freq: Three times a day (TID) | SUBLINGUAL | Status: DC
  Administered 2024-02-13: 1 via SUBLINGUAL
  Filled 2024-02-13 (×2): qty 1

## 2024-02-13 MED ORDER — TRIHEXYPHENIDYL HCL 5 MG PO TABS
5.0000 mg | ORAL_TABLET | Freq: Three times a day (TID) | ORAL | Status: DC
  Administered 2024-02-13: 5 mg via ORAL
  Filled 2024-02-13 (×2): qty 1

## 2024-02-13 MED ORDER — ARIPIPRAZOLE ER 400 MG IM PRSY
400.0000 mg | PREFILLED_SYRINGE | INTRAMUSCULAR | Status: DC
  Administered 2024-02-13: 400 mg via INTRAMUSCULAR

## 2024-02-13 MED ORDER — ARIPIPRAZOLE ER 400 MG IM PRSY: 400.0000 mg | PREFILLED_SYRINGE | INTRAMUSCULAR | Status: DC

## 2024-02-13 NOTE — Progress Notes (Signed)
 Pt has been accepted to [facility] TODAY 02/13/2024 Bed assignment: Main campus  Pt meets inpatient criteria per: Joaquin Courts NP   Attending Physician will be Loni Beckwith, MD  Report can be called to: [phone] (this is a pager, please leave call-back number when giving report)  Pt can arrive ASAP   Care Team Notified: Joaquin Courts NP, Roseanne Reno RN, Randi McCombs RN    [place] Darriel Utter LCSW-A   02/13/2024 1:15 PM

## 2024-02-13 NOTE — ED Provider Notes (Signed)
 Behavioral Health Progress Note  Date and Time: 02/13/2024 1:13 PM Name: Cole Henderson MRN:  [id number]  Subjective: " I need my trihexyphenidyl"  Diagnosis:  Final diagnoses:  Essential hypertension  Generalized anxiety disorder  Chronic paranoid schizophrenia (HCC)  Tardive dyskinesia   Total Time spent with patient: 30 minutes  Cole Henderson is a 48 year old male with a chronic paranoid schizophrenia, opiate use disorder disorder, methamphetamine use disorder, and homelessness. Patient is established in the outpatient [facility] behavioral health clinic. Patient has not been seen since January during his last outpatient visit patient was referred to the crisis center and admitted for observation.  Patient reports that he has remained compliant with his psychiatric medications, although he was admitted due to psychosis with delusions and mania.  Patient's home medications have not been restarted or reconciled.  Patient is due for his Abilify maintain a injection.  Patient reports he did sleep some last night.  Patient at times can be observed talking to himself during evaluation this morning, appears to be responding to internal stimuli.  Patient continues to meet inpatient psychiatric treatment criteria however has not been picked up by any inpatient facility as there are no appropriate bed availability at [facility].  Past Psychiatric History: chronic paranoid schizophrenia, opiate use disorder disorder, methamphetamine use disorder,  Past Medical History: Hypertension, and EPS  Family History: None reported  Family Psychiatric  History: None reported  Social History: Homeless   Additional Social History:    Pain Medications: SEE MAR Prescriptions: SEE MAR Over the Counter: SEE MAR History of alcohol / drug use?: Yes Longest period of sobriety (when/how long): Unknown Negative Consequences of Use: Financial, Personal relationships Withdrawal Symptoms: Agitation Name of  Substance 1: METH 1 - Age of First Use: 11 1 - Amount (size/oz): .25 GRAM 1 - Frequency: DAILY 1 - Duration: ONGOING 1 - Last Use / Amount: 02/12/24                  Sleep: Poor  Appetite:  Good  Current Medications:  Current Facility-Administered Medications  Medication Dose Route Frequency Provider Last Rate Last Admin   acetaminophen (TYLENOL) tablet 650 mg  650 mg Oral Q6H PRN Marlou Sa, NP   650 mg at 02/12/24 1531   alum & mag hydroxide-simeth (MAALOX/MYLANTA) 200-200-20 MG/5ML suspension 30 mL  30 mL Oral Q4H PRN Rayburn Go, Veronique M, NP       amLODipine (NORVASC) tablet 5 mg  5 mg Oral Daily Bing Neighbors, NP       ARIPiprazole ER (ABILIFY MAINTENA) 400 MG prefilled syringe 400 mg  400 mg Intramuscular Q28 days Bing Neighbors, NP       buprenorphine-naloxone (SUBOXONE) 8-2 mg per SL tablet 1 tablet  1 tablet Sublingual TID Bing Neighbors, NP       magnesium hydroxide (MILK OF MAGNESIA) suspension 30 mL  30 mL Oral Daily PRN Rayburn Go, Veronique M, NP       OLANZapine (ZYPREXA) injection 10 mg  10 mg Intramuscular TID PRN Marlou Sa, NP   10 mg at 02/13/24 0409   OLANZapine (ZYPREXA) injection 5 mg  5 mg Intramuscular TID PRN Marlou Sa, NP       OLANZapine zydis (ZYPREXA) disintegrating tablet 5 mg  5 mg Oral TID PRN Marlou Sa, NP       sertraline (ZOLOFT) tablet 100 mg  100 mg Oral Daily Bing Neighbors, NP       trihexyphenidyl (  ARTANE) tablet 5 mg  5 mg Oral TID WC Bing Neighbors, NP       Current Outpatient Medications  Medication Sig Dispense Refill   Buprenorphine HCl-Naloxone HCl 8-2 MG FILM Place 1 Film under the tongue 3 (three) times daily.     sertraline (ZOLOFT) 100 MG tablet Take 100 mg by mouth daily.     amLODipine (NORVASC) 5 MG tablet Take 1 tablet (5 mg total) by mouth daily. (Patient not taking: Reported on 02/12/2024) 30 tablet 0   amphetamine-dextroamphetamine (ADDERALL) 10 MG tablet  Take 30 mg by mouth 2 (two) times daily. (Patient not taking: Reported on 02/12/2024)     apixaban (ELIQUIS) 2.5 MG TABS tablet Take 1 tablet (2.5 mg total) by mouth 2 (two) times daily. (Patient not taking: Reported on 02/12/2024) 60 tablet 0   ARIPiprazole ER (ABILIFY MAINTENA) 400 MG SRER injection Inject 2 mLs (400 mg total) into the muscle every 28 (twenty-eight) days. 2 mL 2   gabapentin (NEURONTIN) 300 MG capsule Take 1 capsule (300 mg total) by mouth 3 (three) times daily. (Patient not taking: Reported on 02/12/2024) 90 capsule 0   traZODone (DESYREL) 150 MG tablet TAKE 1 TABLET (150 MG TOTAL) BY MOUTH AT BEDTIME FOR 15 DAYS. 90 tablet 1   trihexyphenidyl (ARTANE) 5 MG tablet TAKE 1 TABLET BY MOUTH 3 TIMES DAILY WITH MEALS. 270 tablet 1    Labs  Lab Results:  Admission on 02/12/2024  Component Date Value Ref Range Status   WBC 02/12/2024 6.9  4.0 - 10.5 K/uL Final   RBC 02/12/2024 4.20 (L)  4.22 - 5.81 MIL/uL Final   Hemoglobin 02/12/2024 11.8 (L)  13.0 - 17.0 g/dL Final   HCT 95/62/1308 35.6 (L)  39.0 - 52.0 % Final   MCV 02/12/2024 84.8  80.0 - 100.0 fL Final   MCH 02/12/2024 28.1  26.0 - 34.0 pg Final   MCHC 02/12/2024 33.1  30.0 - 36.0 g/dL Final   RDW 65/78/4696 14.0  11.5 - 15.5 % Final   Platelets 02/12/2024 301  150 - 400 K/uL Final   nRBC 02/12/2024 0.0  0.0 - 0.2 % Final   Neutrophils Relative % 02/12/2024 55  % Final   Neutro Abs 02/12/2024 3.7  1.7 - 7.7 K/uL Final   Lymphocytes Relative 02/12/2024 31  % Final   Lymphs Abs 02/12/2024 2.2  0.7 - 4.0 K/uL Final   Monocytes Relative 02/12/2024 8  % Final   Monocytes Absolute 02/12/2024 0.6  0.1 - 1.0 K/uL Final   Eosinophils Relative 02/12/2024 5  % Final   Eosinophils Absolute 02/12/2024 0.4  0.0 - 0.5 K/uL Final   Basophils Relative 02/12/2024 1  % Final   Basophils Absolute 02/12/2024 0.1  0.0 - 0.1 K/uL Final   Immature Granulocytes 02/12/2024 0  % Final   Abs Immature Granulocytes 02/12/2024 0.02  0.00 - 0.07 K/uL  Final   Performed at [facility], [address] [place], [place] [address]   Sodium 02/12/2024 135  135 - 145 mmol/L Final   Potassium 02/12/2024 3.6  3.5 - 5.1 mmol/L Final   Chloride 02/12/2024 101  98 - 111 mmol/L Final   CO2 02/12/2024 22  22 - 32 mmol/L Final   Glucose, Bld 02/12/2024 97  70 - 99 mg/dL Final   Glucose reference range applies only to samples taken after fasting for at least 8 hours.   BUN 02/12/2024 22 (H)  6 - 20 mg/dL Final  Creatinine, Ser 02/12/2024 0.81  0.61 - 1.24 mg/dL Final   Calcium 60/45/4098 8.7 (L)  8.9 - 10.3 mg/dL Final   Total Protein 11/91/4782 6.8  6.5 - 8.1 g/dL Final   Albumin 95/62/1308 4.1  3.5 - 5.0 g/dL Final   AST 65/78/4696 24  15 - 41 U/L Final   ALT 02/12/2024 24  0 - 44 U/L Final   Alkaline Phosphatase 02/12/2024 74  38 - 126 U/L Final   Total Bilirubin 02/12/2024 0.6  0.0 - 1.2 mg/dL Final   GFR, Estimated 02/12/2024 >60  >60 mL/min Final   Comment: (NOTE) Calculated using the CKD-EPI Creatinine Equation (2021)    Anion gap 02/12/2024 12  5 - 15 Final   Performed at [facility], [address], [place], [place] [address]   Hgb A1c MFr Bld 02/12/2024 5.3  4.8 - 5.6 % Final   Comment: (NOTE) Pre diabetes:          5.7%-6.4%  Diabetes:              >6.4%  Glycemic control for   <7.0% adults with diabetes    Mean Plasma Glucose 02/12/2024 105.41  mg/dL Final   Performed at [facility], [address], [place], [place] [address]   Magnesium 02/12/2024 2.3  1.7 - 2.4 mg/dL Final   Performed at [facility], [address], [place], [place] [address]   Alcohol, Ethyl (B) 02/12/2024 <10  <10 mg/dL Final   Comment: (NOTE) Lowest detectable limit for serum alcohol is 10 mg/dL.  For medical purposes only. Performed at [facility], [address] [place], [place] [address]    Cholesterol 02/12/2024 134  0 - 200 mg/dL Final   Triglycerides 64/40/3474 33  <150 mg/dL Final   HDL 25/95/6387 72  >40  mg/dL Final   Total CHOL/HDL Ratio 02/12/2024 1.9  RATIO Final   VLDL 02/12/2024 7  0 - 40 mg/dL Final   LDL Cholesterol 02/12/2024 55  0 - 99 mg/dL Final   Comment:        Total Cholesterol/HDL:CHD Risk Coronary Heart Disease Risk Table                     Men   Women  1/2 Average Risk   3.4   3.3  Average Risk       5.0   4.4  2 X Average Risk   9.6   7.1  3 X Average Risk  23.4   11.0        Use the calculated Patient Ratio above and the CHD Risk Table to determine the patient's CHD Risk.        ATP III CLASSIFICATION (LDL):  <100     mg/dL   Optimal  564-332  mg/dL   Near or Above                    Optimal  130-159  mg/dL   Borderline  951-884  mg/dL   High  >166     mg/dL   Very High Performed at [facility], [address] [place], [place] [address]    TSH 02/12/2024 1.321  0.350 - 4.500 uIU/mL Final   Comment: Performed by a 3rd Generation assay with a functional sensitivity of <=0.01 uIU/mL. Performed at [facility], [address], [place], [place] [address]    RPR Ser Ql 02/12/2024 NON  REACTIVE  NON REACTIVE Final   Performed at [facility], [address], [place], [place] [address]   Color, Urine 02/13/2024 YELLOW  YELLOW Final   APPearance 02/13/2024 CLEAR  CLEAR Final   Specific Gravity, Urine 02/13/2024 1.016  1.005 - 1.030 Final   pH 02/13/2024 6.0  5.0 - 8.0 Final   Glucose, UA 02/13/2024 NEGATIVE  NEGATIVE mg/dL Final   Hgb urine dipstick 02/13/2024 NEGATIVE  NEGATIVE Final   Bilirubin Urine 02/13/2024 NEGATIVE  NEGATIVE Final   Ketones, ur 02/13/2024 NEGATIVE  NEGATIVE mg/dL Final   Protein, ur 60/45/4098 NEGATIVE  NEGATIVE mg/dL Final   Nitrite 11/91/4782 NEGATIVE  NEGATIVE Final   Leukocytes,Ua 02/13/2024 NEGATIVE  NEGATIVE Final   Performed at [facility], [address], [place], [place] [address]   POC Amphetamine UR 02/13/2024 Positive (A)  NONE DETECTED (Cut Off Level 1000 ng/mL) Final   POC Secobarbital (BAR)  02/13/2024 None Detected  NONE DETECTED (Cut Off Level 300 ng/mL) Final   POC Buprenorphine (BUP) 02/13/2024 None Detected  NONE DETECTED (Cut Off Level 10 ng/mL) Final   POC Oxazepam (BZO) 02/13/2024 None Detected  NONE DETECTED (Cut Off Level 300 ng/mL) Final   POC Cocaine UR 02/13/2024 None Detected  NONE DETECTED (Cut Off Level 300 ng/mL) Final   POC Methamphetamine UR 02/13/2024 Positive (A)  NONE DETECTED (Cut Off Level 1000 ng/mL) Final   POC Morphine 02/13/2024 None Detected  NONE DETECTED (Cut Off Level 300 ng/mL) Final   POC Methadone UR 02/13/2024 None Detected  NONE DETECTED (Cut Off Level 300 ng/mL) Final   POC Oxycodone UR 02/13/2024 None Detected  NONE DETECTED (Cut Off Level 100 ng/mL) Final   POC Marijuana UR 02/13/2024 None Detected  NONE DETECTED (Cut Off Level 50 ng/mL) Final  No results displayed because visit has over 200 results.    Admission on 10/22/2023, Discharged on 10/23/2023  Component Date Value Ref Range Status   Sodium 10/22/2023 137  135 - 145 mmol/L Final   Potassium 10/22/2023 4.4  3.5 - 5.1 mmol/L Final   Chloride 10/22/2023 103  98 - 111 mmol/L Final   CO2 10/22/2023 26  22 - 32 mmol/L Final   Glucose, Bld 10/22/2023 92  70 - 99 mg/dL Final   Glucose reference range applies only to samples taken after fasting for at least 8 hours.   BUN 10/22/2023 13  6 - 20 mg/dL Final   Creatinine, Ser 10/22/2023 0.74  0.61 - 1.24 mg/dL Final   Calcium 30/86/5784 9.4  8.9 - 10.3 mg/dL Final   Total Protein 69/62/9528 6.6  6.5 - 8.1 g/dL Final   Albumin 41/32/4401 4.1  3.5 - 5.0 g/dL Final   AST 02/72/5366 20  15 - 41 U/L Final   ALT 10/22/2023 25  0 - 44 U/L Final   Alkaline Phosphatase 10/22/2023 52  38 - 126 U/L Final   Total Bilirubin 10/22/2023 0.8  <1.2 mg/dL Final   GFR, Estimated 10/22/2023 >60  >60 mL/min Final   Comment: (NOTE) Calculated using the CKD-EPI Creatinine Equation (2021)    Anion gap 10/22/2023 8  5 - 15 Final   Performed at [facility], [address], [place], [place] [address]   WBC 10/22/2023 8.2  4.0 - 10.5 K/uL Final   RBC 10/22/2023 4.28  4.22 - 5.81 MIL/uL Final   Hemoglobin 10/22/2023 12.3 (L)  13.0 - 17.0 g/dL Final   HCT 74/25/9563 37.0 (L)  39.0 -  52.0 % Final   MCV 10/22/2023 86.4  80.0 - 100.0 fL Final   MCH 10/22/2023 28.7  26.0 - 34.0 pg Final   MCHC 10/22/2023 33.2  30.0 - 36.0 g/dL Final   RDW 16/09/9603 14.2  11.5 - 15.5 % Final   Platelets 10/22/2023 311  150 - 400 K/uL Final   nRBC 10/22/2023 0.0  0.0 - 0.2 % Final   Neutrophils Relative % 10/22/2023 57  % Final   Neutro Abs 10/22/2023 4.7  1.7 - 7.7 K/uL Final   Lymphocytes Relative 10/22/2023 29  % Final   Lymphs Abs 10/22/2023 2.4  0.7 - 4.0 K/uL Final   Monocytes Relative 10/22/2023 7  % Final   Monocytes Absolute 10/22/2023 0.6  0.1 - 1.0 K/uL Final   Eosinophils Relative 10/22/2023 6  % Final   Eosinophils Absolute 10/22/2023 0.5  0.0 - 0.5 K/uL Final   Basophils Relative 10/22/2023 1  % Final   Basophils Absolute 10/22/2023 0.1  0.0 - 0.1 K/uL Final   Immature Granulocytes 10/22/2023 0  % Final   Abs Immature Granulocytes 10/22/2023 0.03  0.00 - 0.07 K/uL Final   Performed at [facility], [address] [place], [place] [address]   Hgb A1c MFr Bld 10/22/2023 6.0 (H)  4.8 - 5.6 % Final   Comment: (NOTE) Pre diabetes:          5.7%-6.4%  Diabetes:              >6.4%  Glycemic control for   <7.0% adults with diabetes    Mean Plasma Glucose 10/22/2023 125.5  mg/dL Final   Performed at [facility] Lab, [address], [place], [place] [address]   TSH 10/22/2023 1.156  0.350 - 4.500 uIU/mL Final   Comment: Performed by a 3rd Generation assay with a functional sensitivity of <=0.01 uIU/mL. Performed at [facility] Lab, [address], [place], [place] [address]    POC Amphetamine UR 10/22/2023 Positive (A)  NONE DETECTED (Cut Off Level 1000 ng/mL) Final   POC Secobarbital (BAR) 10/22/2023 None Detected  NONE DETECTED  (Cut Off Level 300 ng/mL) Final   POC Buprenorphine (BUP) 10/22/2023 None Detected  NONE DETECTED (Cut Off Level 10 ng/mL) Final   POC Oxazepam (BZO) 10/22/2023 None Detected  NONE DETECTED (Cut Off Level 300 ng/mL) Final   POC Cocaine UR 10/22/2023 None Detected  NONE DETECTED (Cut Off Level 300 ng/mL) Final   POC Methamphetamine UR 10/22/2023 Positive (A)  NONE DETECTED (Cut Off Level 1000 ng/mL) Final   POC Morphine 10/22/2023 None Detected  NONE DETECTED (Cut Off Level 300 ng/mL) Final   POC Methadone UR 10/22/2023 None Detected  NONE DETECTED (Cut Off Level 300 ng/mL) Final   POC Oxycodone UR 10/22/2023 None Detected  NONE DETECTED (Cut Off Level 100 ng/mL) Final   POC Marijuana UR 10/22/2023 None Detected  NONE DETECTED (Cut Off Level 50 ng/mL) Final   [facility] test code 10/22/2023 [id number]   Final   [facility] test name 10/22/2023 URINE, RANDOM   Final   Source ([facility]) 10/22/2023 ETHYL ALCOHOL SCREEN WITH CONFIRMATION URINE   Final   Performed at [facility], [address] [place], [place] [address]   Misc [facility] result 10/22/2023 COMMENT   Final   Comment: (NOTE) Test Ordered: [id number] Ethanol, Urine (MW) Ethanol, Urine                 Negative  mg/dL    UI     Reference Range: Cutoff=0.020                          Performed At: [facility] [address] [place], [place] [id number] Jolene Schimke MD HQ:[id number] Performed At: UI [facility] [address] [place], [place] [id number] Avis Epley PhD VO:[id number]   Admission on 08/28/2023, Discharged on 08/28/2023  Component Date Value Ref Range Status   Sodium 08/28/2023 139  135 - 145 mmol/L Final   Potassium 08/28/2023 4.4  3.5 - 5.1 mmol/L Final   Chloride 08/28/2023 106  98 - 111 mmol/L Final   CO2 08/28/2023 26  22 - 32 mmol/L Final   Glucose, Bld 08/28/2023 123 (H)  70 - 99 mg/dL Final   Glucose reference range applies only to samples taken after fasting for at least 8 hours.   BUN 08/28/2023 14   6 - 20 mg/dL Final   Creatinine, Ser 08/28/2023 0.94  0.61 - 1.24 mg/dL Final   Calcium 42/59/5638 8.8 (L)  8.9 - 10.3 mg/dL Final   Total Protein 75/64/3329 6.0 (L)  6.5 - 8.1 g/dL Final   Albumin 51/88/4166 3.3 (L)  3.5 - 5.0 g/dL Final   AST 06/02/1600 21  15 - 41 U/L Final   ALT 08/28/2023 23  0 - 44 U/L Final   Alkaline Phosphatase 08/28/2023 51  38 - 126 U/L Final   Total Bilirubin 08/28/2023 0.6  0.3 - 1.2 mg/dL Final   GFR, Estimated 08/28/2023 >60  >60 mL/min Final   Comment: (NOTE) Calculated using the CKD-EPI Creatinine Equation (2021)    Anion gap 08/28/2023 7  5 - 15 Final   Performed at [facility] Lab, [address] [place], [place] [address]   WBC 08/28/2023 7.9  4.0 - 10.5 K/uL Final   RBC 08/28/2023 3.96 (L)  4.22 - 5.81 MIL/uL Final   Hemoglobin 08/28/2023 11.0 (L)  13.0 - 17.0 g/dL Final   HCT 55/73/2202 34.9 (L)  39.0 - 52.0 % Final   MCV 08/28/2023 88.1  80.0 - 100.0 fL Final   MCH 08/28/2023 27.8  26.0 - 34.0 pg Final   MCHC 08/28/2023 31.5  30.0 - 36.0 g/dL Final   RDW 54/27/0623 14.3  11.5 - 15.5 % Final   Platelets 08/28/2023 316  150 - 400 K/uL Final   nRBC 08/28/2023 0.0  0.0 - 0.2 % Final   Neutrophils Relative % 08/28/2023 61  % Final   Neutro Abs 08/28/2023 4.8  1.7 - 7.7 K/uL Final   Lymphocytes Relative 08/28/2023 24  % Final   Lymphs Abs 08/28/2023 1.9  0.7 - 4.0 K/uL Final   Monocytes Relative 08/28/2023 8  % Final   Monocytes Absolute 08/28/2023 0.6  0.1 - 1.0 K/uL Final   Eosinophils Relative 08/28/2023 6  % Final   Eosinophils Absolute 08/28/2023 0.5  0.0 - 0.5 K/uL Final   Basophils Relative 08/28/2023 1  % Final   Basophils Absolute 08/28/2023 0.1  0.0 - 0.1 K/uL Final   Immature Granulocytes 08/28/2023 0  % Final   Abs Immature Granulocytes 08/28/2023 0.02  0.00 - 0.07 K/uL Final   Performed at [facility] Lab, [address] [place], [place] [address]   Alcohol, Ethyl (B) 08/28/2023 <10  <10 mg/dL Final   Comment:  (NOTE) Lowest detectable limit for serum alcohol is 10 mg/dL.  For medical purposes only. Performed  at [facility] Lab, [address], [place], [place] [address]     Blood Alcohol level:  Lab Results  Component Value Date   [facility] <10 02/12/2024   ETH <10 11/26/2023    Metabolic Disorder Labs: Lab Results  Component Value Date   HGBA1C 5.3 02/12/2024   MPG 105.41 02/12/2024   MPG 125.5 10/22/2023   Lab Results  Component Value Date   PROLACTIN 9.2 02/21/2023   Lab Results  Component Value Date   CHOL 134 02/12/2024   TRIG 33 02/12/2024   HDL 72 02/12/2024   CHOLHDL 1.9 02/12/2024   VLDL 7 02/12/2024   LDLCALC 55 02/12/2024   LDLCALC 77 09/06/2022    Therapeutic Lab Levels: No results found for: "LITHIUM" No results found for: "VALPROATE" Lab Results  Component Value Date   CBMZ 5.2 05/10/2009    Physical Findings   AIMS    Flowsheet Row [facility]  AIMS Total Score 3      AUDIT    Flowsheet Row Ad[facility] (Discharged[facility]  Alcohol Use Disorder Identification Test Final Score (AUDIT) 0      CAGE-AID    Flowsheet Row ED to Hosp-Admission (Discharged) from 11/26/2023 in [facility]  SURGICAL ED to Hosp-Admission (Discharged) from 01/23/2023 in [facility] KIDNEY UNIT  CAGE-AID Score 3 3      GAD-7    Flowsheet Row Office Visit from 12/14/2023 in [facility]  Total GAD-7 Score 18      PHQ2-9    Flowsheet Row ED from 02/12/2024 in [facility] Office Visit from 12/14/2023 in [facility] ED from 11/30/2020 in [facility] Emergency Department at [facility] ED from 08/28/2020 in [facility]  PHQ-2 Total Score 2 3 2  0  PHQ-9 Total Score 7 17 19 9        Flowsheet Row ED from 02/12/2024 in [facility] ED from 01/28/2024 in [facility] Emergency Department at [facility] Office Visit from 12/14/2023 in [facility]  C-SSRS RISK CATEGORY No Risk No Risk Moderate Risk        Musculoskeletal  Strength & Muscle Tone: within normal limits Gait & Station: normal Patient leans: N/A  Psychiatric Specialty Exam  Presentation  General Appearance:  Disheveled  Eye Contact: Poor  Speech: Pressured  Speech Volume: Decreased  Handedness: Right   Mood and Affect  Mood: Anxious  Affect: Non-Congruent   Thought Process  Thought Processes: Disorganized; Irrevelant  Descriptions of Associations:Loose  Orientation:Partial  Thought Content:Tangential; Scattered  Diagnosis of Schizophrenia or Schizoaffective disorder in past: No  Duration of Psychotic Symptoms: Greater than six months   Hallucinations:Hallucinations: Auditory Description of Auditory Hallucinations: UTA (Pt disorganized, unable to make up a meaningful sentence)  Ideas of Reference:Paranoia  Suicidal Thoughts:Suicidal Thoughts: No  Homicidal Thoughts:Homicidal Thoughts: No   Sensorium  Memory: Immediate Fair; Recent Fair; Remote Poor  Judgment: Poor  Insight: Poor   Executive Functions  Concentration: Poor  Attention Span: Poor  Recall: Poor  Fund of Knowledge: Fair  Language: Fair   Psychomotor Activity  Psychomotor Activity: Psychomotor Activity: Restlessness   Assets  Assets: Desire for Improvement; Physical Health   Sleep  Sleep: Sleep: Poor   Nutritional Assessment (For OBS and FBC admissions only) Has the  patient had a weight loss or gain of 10 pounds or more in the last 3 months?: No Has the patient had a decrease in food intake/or appetite?: No Does the patient have dental problems?: No Does the patient have eating habits or behaviors that may be  indicators of an eating disorder including binging or inducing vomiting?: No Has the patient recently lost weight without trying?: 0 Has the patient been eating poorly because of a decreased appetite?: 0 Malnutrition Screening Tool Score: 0    Physical Exam  Physical Exam Vitals reviewed.  Constitutional:      Appearance: Normal appearance.  HENT:     Head: Normocephalic and atraumatic.  Eyes:     Extraocular Movements: Extraocular movements intact.     Pupils: Pupils are equal, round, and reactive to light.  Cardiovascular:     Rate and Rhythm: Normal rate and regular rhythm.  Pulmonary:     Effort: Pulmonary effort is normal.     Breath sounds: Normal breath sounds.  Skin:    General: Skin is warm and dry.  Neurological:     General: No focal deficit present.     Mental Status: He is alert and oriented to person, place, and time.     Review of Systems  Psychiatric/Behavioral:  Positive for substance abuse.    Blood pressure 132/84, pulse 100, temperature 98.1 F (36.7 C), temperature source Oral, resp. rate 20, SpO2 98%. There is no height or weight on file to calculate BMI.  Treatment Plan Summary: Daily contact with patient to assess and evaluate symptoms and progress in treatment with medication management, continues observation, patient continues to meet inpatient psychiatric treatment criteria and is pending placement social work has fax patient out to appropriate facilities as there is no bed availability at [facility].  1. Essential hypertension - amLODipine (NORVASC) tablet 5 mg  2. Generalized anxiety disorder - sertraline (ZOLOFT) 100 MG tablet; Take 100 mg by mouth daily.  3. Chronic paranoid schizophrenia (HCC) (Primary) - ARIPiprazole ER (ABILIFY MAINTENA) 400 MG prefilled syringe 400 mg  4. Tardive dyskinesia - trihexyphenidyl (ARTANE) tablet 5 mg   Medications have been restarted.  Patient is overdue for his Abilify Maintena injection.  Will also  supplement with oral Abilify 5 mg at bedtime for the next 14 days. If patient discharged from [facility] he will need a outpatient appointment schedule within 1 week of discharge.  Joaquin Courts, NP 02/13/2024 1:13 PM

## 2024-02-13 NOTE — ED Notes (Signed)
 Refused morning vitals check/

## 2024-02-13 NOTE — ED Notes (Signed)
 Patient is transferring to [facility] at this time via safe transport. Voluntary consent, EMTALA, and other transfer paperwork sent with patient and provided to transport. Patient A&OX4. Denies SI,HI, and A/V/H. Calm and cooperative at time of transport. All valuables/belongings sent with patient. Patient in no current distress.

## 2024-02-13 NOTE — Progress Notes (Addendum)
 LCSW Progress Note  [id number]   Cole Henderson  02/13/2024  12:48 PM  Description:   Inpatient Psychiatric Referral  Patient was recommended inpatient per Joaquin Courts NP. There are no available beds at [facility], per [facility] RN. Patient was referred to the following out of network facilities: Destination  Service Provider Request Status Services Address Phone Fax Patient Preferred  [facility] Pending - Request Sent -- [address] [place] [address] [phone] --  [facility] Pending - Request Sent -- [address] [place] [address] [phone] --  [facility] Pending - Request Sent -- [address] [place] [address] [phone] --  [facility] Pending - Request Sent -- [address] [name] [place] [phone] --  [facility] Pending - Request Sent -- [address] [place] [address] [phone] --  [facility] Pending - Request Sent -- [address] [place] [address] [phone] --  [facility] Pending - Request Sent -- [address], [place] [address] [phone] --  [facility] Pending - Request Sent -- [address] [place] [address] [phone] --      Situation ongoing, CSW to continue following and update chart as more information becomes available.      [place] Rahma Meller MSW, LCSW  02/13/2024 12:48 PM

## 2024-02-13 NOTE — Discharge Instructions (Addendum)
Accepted to [facility] 
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
## 2024-02-13 NOTE — ED Notes (Signed)
 Patient is awake and easily irritable. Patient appears upset asking for his Artane medication. Patient explained that provider will be coming to speak with him this morning regarding his medications. Patient currently denies SI,HI, and A/V/H with no plan or intent. Patient received breakfast this morning and is currently resting again with no s/s of current distress.

## 2024-02-13 NOTE — ED Notes (Addendum)
 Report provided to Cole Henderson, Charity fundraiser at [facility]. Transportation contacted. Patient aware of pending transfer. Patient ate lunch and calmly awaiting transportation.

## 2024-02-13 NOTE — ED Notes (Signed)
 Patient currently asleep. Even rise and fall of chest with breathing even and unlabored. No s/s of current distress.

## 2024-02-25 ENCOUNTER — Emergency Department (HOSPITAL_COMMUNITY)
Admission: EM | Admit: 2024-02-25 | Discharge: 2024-02-25 | Disposition: A | Discharge status: Home / Self Care | Payer: MEDICAID

## 2024-02-25 ENCOUNTER — Encounter (HOSPITAL_COMMUNITY): Payer: Self-pay

## 2024-02-25 ENCOUNTER — Other Ambulatory Visit: Payer: Self-pay

## 2024-02-25 ENCOUNTER — Emergency Department (HOSPITAL_COMMUNITY): Payer: MEDICAID

## 2024-02-25 DIAGNOSIS — M25462 Effusion, left knee: Secondary | ICD-10-CM | POA: Insufficient documentation

## 2024-02-25 DIAGNOSIS — M25469 Effusion, unspecified knee: Secondary | ICD-10-CM

## 2024-02-25 DIAGNOSIS — Z79899 Other long term (current) drug therapy: Secondary | ICD-10-CM | POA: Insufficient documentation
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
## 2024-02-25 NOTE — ED Triage Notes (Signed)
 Pt c/o left knee pain and swelling since yesterday. Pt's left knee has 2+ swelling. Pt limped to hallway bed from waiting area.

## 2024-02-26 ENCOUNTER — Other Ambulatory Visit: Payer: Self-pay

## 2024-02-26 ENCOUNTER — Emergency Department (HOSPITAL_COMMUNITY)
Admission: EM | Admit: 2024-02-26 | Discharge: 2024-02-27 | Disposition: A | Discharge status: Other Acute Inpatient Hospital | Payer: MEDICAID | Attending: Emergency Medicine | Admitting: Emergency Medicine

## 2024-02-26 DIAGNOSIS — F200 Paranoid schizophrenia: Secondary | ICD-10-CM

## 2024-02-26 DIAGNOSIS — I10 Essential (primary) hypertension: Secondary | ICD-10-CM | POA: Insufficient documentation

## 2024-02-26 DIAGNOSIS — F1510 Other stimulant abuse, uncomplicated: Secondary | ICD-10-CM | POA: Diagnosis present

## 2024-02-26 DIAGNOSIS — F419 Anxiety disorder, unspecified: Secondary | ICD-10-CM | POA: Insufficient documentation

## 2024-02-26 DIAGNOSIS — F209 Schizophrenia, unspecified: Secondary | ICD-10-CM | POA: Diagnosis present

## 2024-02-26 DIAGNOSIS — M25462 Effusion, left knee: Secondary | ICD-10-CM | POA: Insufficient documentation

## 2024-02-26 DIAGNOSIS — R45851 Suicidal ideations: Secondary | ICD-10-CM | POA: Insufficient documentation

## 2024-02-26 LAB — COMPREHENSIVE METABOLIC PANEL
ALT: 25 U/L (ref 0–44)
AST: 32 U/L (ref 15–41)
Albumin: 3.8 g/dL (ref 3.5–5.0)
Alkaline Phosphatase: 75 U/L (ref 38–126)
Anion gap: 9 (ref 5–15)
BUN: 13 mg/dL (ref 6–20)
CO2: 25 mmol/L (ref 22–32)
Calcium: 8.8 mg/dL — ABNORMAL LOW (ref 8.9–10.3)
Chloride: 104 mmol/L (ref 98–111)
Creatinine, Ser: 0.74 mg/dL (ref 0.61–1.24)
GFR, Estimated: >60 mL/min (ref >60)
Glucose, Bld: 139 mg/dL — ABNORMAL HIGH (ref 70–99)
Potassium: 3.7 mmol/L (ref 3.5–5.1)
Sodium: 138 mmol/L (ref 135–145)
Total Bilirubin: 0.7 mg/dL (ref 0.0–1.2)
Total Protein: 6.4 g/dL — ABNORMAL LOW (ref 6.5–8.1)

## 2024-02-26 LAB — RAPID URINE DRUG SCREEN, HOSP PERFORMED
Amphetamines: POSITIVE — AB
Barbiturates: NOT DETECTED
Benzodiazepines: NOT DETECTED
Cocaine: NOT DETECTED
Opiates: NOT DETECTED
Tetrahydrocannabinol: NOT DETECTED

## 2024-02-26 LAB — CBC WITH DIFFERENTIAL/PLATELET
Abs Immature Granulocytes: 0.02 10*3/uL (ref 0.00–0.07)
Basophils Absolute: 0 10*3/uL (ref 0.0–0.1)
Basophils Relative: 0 %
Eosinophils Absolute: 0.2 10*3/uL (ref 0.0–0.5)
Eosinophils Relative: 3 %
HCT: 34.3 % — ABNORMAL LOW (ref 39.0–52.0)
Hemoglobin: 11.1 g/dL — ABNORMAL LOW (ref 13.0–17.0)
Immature Granulocytes: 0 %
Lymphocytes Relative: 17 %
Lymphs Abs: 1.4 10*3/uL (ref 0.7–4.0)
MCH: 27.3 pg (ref 26.0–34.0)
MCHC: 32.4 g/dL (ref 30.0–36.0)
MCV: 84.5 fL (ref 80.0–100.0)
Monocytes Absolute: 0.7 10*3/uL (ref 0.1–1.0)
Monocytes Relative: 9 %
Neutro Abs: 5.7 10*3/uL (ref 1.7–7.7)
Neutrophils Relative %: 71 %
Platelets: 296 10*3/uL (ref 150–400)
RBC: 4.06 MIL/uL — ABNORMAL LOW (ref 4.22–5.81)
RDW: 13.9 % (ref 11.5–15.5)
WBC: 8 10*3/uL (ref 4.0–10.5)
nRBC: 0 % (ref 0.0–0.2)

## 2024-02-26 LAB — ETHANOL: Alcohol, Ethyl (B): <10 mg/dL (ref <10)

## 2024-02-26 MED ORDER — AMLODIPINE BESYLATE 5 MG PO TABS
5.0000 mg | ORAL_TABLET | Freq: Every day | ORAL | Status: DC
  Administered 2024-02-26 – 2024-02-27 (×2): 5 mg via ORAL
  Filled 2024-02-26 (×2): qty 1

## 2024-02-26 MED ORDER — LORAZEPAM 1 MG PO TABS
1.0000 mg | ORAL_TABLET | ORAL | Status: AC | PRN
  Administered 2024-02-27: 1 mg via ORAL
  Filled 2024-02-26: qty 1

## 2024-02-26 MED ORDER — TRIHEXYPHENIDYL HCL 5 MG PO TABS
5.0000 mg | ORAL_TABLET | Freq: Three times a day (TID) | ORAL | Status: DC
  Administered 2024-02-26: 5 mg via ORAL
  Filled 2024-02-26 (×3): qty 1

## 2024-02-26 MED ORDER — SERTRALINE HCL 100 MG PO TABS
100.0000 mg | ORAL_TABLET | Freq: Every day | ORAL | Status: DC
  Administered 2024-02-26 – 2024-02-27 (×2): 100 mg via ORAL
  Filled 2024-02-26 (×2): qty 1

## 2024-02-26 MED ORDER — ZIPRASIDONE MESYLATE 20 MG IM SOLR
20.0000 mg | Freq: Once | INTRAMUSCULAR | Status: AC
  Administered 2024-02-26: 20 mg via INTRAMUSCULAR
  Filled 2024-02-26: qty 20

## 2024-02-26 MED ORDER — DIPHENHYDRAMINE HCL 50 MG/ML IJ SOLN
25.0000 mg | Freq: Once | INTRAMUSCULAR | Status: AC
  Administered 2024-02-26: 25 mg via INTRAMUSCULAR
  Filled 2024-02-26: qty 1

## 2024-02-26 MED ORDER — MIDAZOLAM HCL 2 MG/2ML IJ SOLN
2.0000 mg | Freq: Once | INTRAMUSCULAR | Status: AC
  Administered 2024-02-26: 2 mg via INTRAMUSCULAR
  Filled 2024-02-26: qty 2

## 2024-02-26 NOTE — ED Notes (Signed)
 IVC efiled magistrate called waiting on findings and custody order envelope #[id number]

## 2024-02-26 NOTE — ED Provider Notes (Addendum)
 [place] EMERGENCY DEPARTMENT AT [facility] Provider Note   CSN: [id number] Arrival date & time: 02/26/24  1332     History  Chief Complaint  Patient presents with   Psychiatric Evaluation   Homicidal    Cole Henderson is a 48 y.o. male.  HPI Patient presents for psychiatric complaints.  Medical history includes HTN, schizophrenia, migraine headaches, polysubstance abuse.  He was seen in the ED yesterday for left knee swelling and pain.  This surgery in December with Dr. Carola Frost.  He was advised to follow-up as an outpatient.  Today, he presents with paranoia and homicidal ideation.  States that he has not taken any of his home medications and has used recent methamphetamines.  He states that he was recently discharged from a psychiatric hospitalization on Thursday.  Since that time, he has not taken any mental medications.  He states that he is amnestic to events that occurred over 36 hours this weekend.  He endorses auditory hallucinations.    Home Medications Prior to Admission medications   Medication Sig Start Date End Date Taking? Authorizing Provider  amLODipine (NORVASC) 5 MG tablet Take 1 tablet (5 mg total) by mouth daily. Patient not taking: Reported on 02/12/2024 02/01/23 03/03/23  Hughie Closs, MD  amphetamine-dextroamphetamine (ADDERALL) 10 MG tablet Take 30 mg by mouth 2 (two) times daily. Patient not taking: Reported on 02/12/2024 10/04/23   [provider]  apixaban (ELIQUIS) 2.5 MG TABS tablet Take 1 tablet (2.5 mg total) by mouth 2 (two) times daily. Patient not taking: Reported on 02/12/2024 11/29/23 12/29/23  Montez Morita, PA-C  ARIPiprazole ER (ABILIFY MAINTENA) 400 MG SRER injection Inject 2 mLs (400 mg total) into the muscle every 28 (twenty-eight) days. 12/14/23   Nwoko, Tommas Olp, PA  Buprenorphine HCl-Naloxone HCl 8-2 MG FILM Place 1 Film under the tongue 3 (three) times daily. 02/03/24   [provider]  gabapentin (NEURONTIN) 300 MG  capsule Take 1 capsule (300 mg total) by mouth 3 (three) times daily. Patient not taking: Reported on 02/12/2024 12/08/23   Juliet Rude, PA-C  sertraline (ZOLOFT) 100 MG tablet Take 100 mg by mouth daily.    [provider]  traZODone (DESYREL) 150 MG tablet TAKE 1 TABLET (150 MG TOTAL) BY MOUTH AT BEDTIME FOR 15 DAYS. 12/21/23 01/05/24  Nwoko, Stephens Shire E, PA  trihexyphenidyl (ARTANE) 5 MG tablet TAKE 1 TABLET BY MOUTH 3 TIMES DAILY WITH MEALS. 12/14/23   Nwoko, Tommas Olp, PA      Allergies    Fish allergy, Haloperidol lactate, Shellfish allergy, Cephalexin, Cogentin [benztropine], Ingrezza [valbenazine tosylate], Risperidone and related, Strawberry extract, and Geodon [ziprasidone]    Review of Systems   Review of Systems  Psychiatric/Behavioral:  Positive for hallucinations. The patient is nervous/anxious.   All other systems reviewed and are negative.   Physical Exam Updated Vital Signs BP (!) 148/92   Pulse (!) 103   Temp 98.4 F (36.9 C) (Oral)   Resp 20   Ht 6' (1.829 m)   Wt 99.8 kg   SpO2 100%   BMI 29.84 kg/m  Physical Exam Vitals and nursing note reviewed.  Constitutional:      General: He is not in acute distress.    Appearance: Normal appearance. He is well-developed. He is not ill-appearing, toxic-appearing or diaphoretic.  HENT:     Head: Normocephalic and atraumatic.     Right Ear: External ear normal.     Left Ear: External ear normal.  Nose: Nose normal.     Mouth/Throat:     Mouth: Mucous membranes are moist.     Comments: Poor dentition Eyes:     Extraocular Movements: Extraocular movements intact.     Conjunctiva/sclera: Conjunctivae normal.  Cardiovascular:     Rate and Rhythm: Normal rate and regular rhythm.  Pulmonary:     Effort: Pulmonary effort is normal. No respiratory distress.  Abdominal:     General: Abdomen is flat. There is no distension.  Musculoskeletal:        General: No deformity. Normal range of motion.     Cervical  back: Normal range of motion and neck supple.  Skin:    General: Skin is warm and dry.  Neurological:     General: No focal deficit present.     Mental Status: He is alert and oriented to person, place, and time.  Psychiatric:        Attention and Perception: Attention normal. He perceives auditory hallucinations.        Mood and Affect: Mood is anxious. Affect is labile.        Speech: Speech is tangential.        Behavior: Behavior is agitated. Behavior is cooperative.        Thought Content: Thought content is paranoid. Thought content includes homicidal ideation.        Cognition and Memory: Memory is impaired.     ED Results / Procedures / Treatments   Labs (all labs ordered are listed, but only abnormal results are displayed) Labs Reviewed  COMPREHENSIVE METABOLIC PANEL - Abnormal; Notable for the following components:      Result Value   Glucose, Bld 139 (*)    Calcium 8.8 (*)    Total Protein 6.4 (*)    All other components within normal limits  CBC WITH DIFFERENTIAL/PLATELET - Abnormal; Notable for the following components:   RBC 4.06 (*)    Hemoglobin 11.1 (*)    HCT 34.3 (*)    All other components within normal limits  ETHANOL  RAPID URINE DRUG SCREEN, HOSP PERFORMED    EKG None  Radiology DG Knee Complete 4 Views Left Result Date: 02/25/2024 CLINICAL DATA:  Left knee pain EXAM: LEFT KNEE - COMPLETE 4+ VIEW COMPARISON:  12/12/2023 FINDINGS: Prior distal femoral ORIF with intact hardware and no evidence of hardware loosening. No fracture or dislocation. Similar tricompartmental degenerative changes. Fragmented osteophytes and/or loose bodies at the posterior joint line. Moderate-large knee joint effusion. Mild soft tissue swelling. IMPRESSION: 1. Moderate-large knee joint effusion. 2. Similar tricompartmental degenerative changes. No acute fractures. Electronically Signed   By: Duanne Guess D.O.   On: 02/25/2024 12:36    Procedures Procedures     Medications Ordered in ED Medications  amLODipine (NORVASC) tablet 5 mg (has no administration in time range)  sertraline (ZOLOFT) tablet 100 mg (has no administration in time range)  trihexyphenidyl (ARTANE) tablet 5 mg (has no administration in time range)    ED Course/ Medical Decision Making/ A&P                                 Medical Decision Making Amount and/or Complexity of Data Reviewed Labs: ordered.  Risk Prescription drug management.   Patient presenting for psychiatric complaints.  He has a history of polysubstance abuse and schizophrenia.  He has a history of tardive dyskinesia secondary to long-term antipsychotic use.  Per chart review,  his current medication regimen includes Artane, sertraline, and Abilify LAI.  Although he states that he was in psychiatric hospital up until Thursday, this is not documented in EMR.  He was seen at [facility] weeks ago.  He was seen in the ED yesterday for nonpsychiatric complaint.  On assessment, patient is well-appearing.  He does have some tangential speech and talks about auditory hallucinations.  He is here voluntarily and does feel that he needs a psychiatric hospitalization.  Lab work was unremarkable.  Patient is medically cleared.  Patient to remain in the ED awaiting TTS evaluation.  Will restart home medications.  While in the ED, patient had worsened agitation, exhibited self-harm behavior with striking himself in the head.  Medications were ordered.        Final Clinical Impression(s) / ED Diagnoses Final diagnoses:  Schizophrenia, unspecified type [facility])    Rx / DC Orders ED Discharge Orders     None         Gloris Manchester, MD 02/26/24 1516    Gloris Manchester, MD 02/26/24 [phone]

## 2024-02-26 NOTE — Progress Notes (Addendum)
 LCSW Progress Note:   MRN: [id number]  Daejon Lich  02/26/2024 9:22 PM   Per Serina Cowper, NP, patient meets criteria for inpatient psychiatric treatment. Patient referred to  [facility], requested the [facility] Selena Batten Books, RN) to review patient for bed placement at [facility]. Awaiting updates.   Received updates from the [facility] Fransico Michael, RN) confirming no appropriate beds at [facility]. Advised to fax patient out.   Patient faxed to the following facilities for consideration of bed placement.   Destination  Service Provider   Address Phone Fax Patient Preferred  [facility]   [address], [place] [address] [phone] --  [facility] Patient Placement   [facility], [place] [phone] --  [facility]   [address] [place], [place] [address] [phone] --  [facility]   [phone] [place] [address] [phone] --  [facility]   [address] [place], [place] [address] [phone] --  [facility]   [address] [place] [address] [phone] --  [facility]   [address] [place] [address] [phone] --  [facility]   [address], [place] [address] [phone] --  [facility]   [address] [place] [address] [phone] [facility]   [place] [phone] --  [facility]   [address] [place] [address] [phone] --  [facility]   [address] [place] [address] [phone] --  [facility]   [address], [place] [address] [phone] --   [facility]   [address] [place], [place] [phone] --  [facility]   [address] [place] [address] [phone] --  [facility]   [address], [place] [address] [phone] --  [facility]   [address] [place] [address] [phone] --  [facility]   [address], [place] [address] [phone] --  [facility]   [address] [place] [address] [phone] --  [facility]   [address] [place] [address] [phone] --  [facility] Regino [place] [address] [phone] --  [facility]   [address] [place] [address] [phone] --  [facility]   [address] [place], [place] [address] [phone] --  [facility]   [address] [place] [address] [phone] --  [facility]   [address] [place] [address] [phone] --  [facility]   [address], [place] [address] [phone] --  [facility]   [place] [phone] -- --

## 2024-02-26 NOTE — Consult Note (Signed)
 [facility] Psychiatric Consult Initial  Patient Name: .Cole Henderson  MRN: [id number]  DOB: December 07, 1975  Consult Order details:  Orders (From admission, onward)     Start     Ordered   02/26/24 1516  CONSULT TO CALL ACT TEAM       Ordering Provider: Gloris Manchester, MD  Provider:  (Not yet assigned)  Question:  Reason for Consult?  Answer:  Psych consult   02/26/24 1515             Mode of Visit: In person    Psychiatry Consult Evaluation  Service Date: February 26, 2024 LOS:  LOS: 0 days  Chief Complaint paranoid schizophrenia  Primary Psychiatric Diagnoses  Chronic paranoid schizophrenia 2.   3.    Assessment  Cole Henderson is a 48 y.o. male admitted: Presented to the EDfor 02/26/2024  1:36 PM for psychiatric events. He carries the psychiatric diagnoses of chronic schizophrenia and has a past medical history of  amphetamine use.   His current presentation of paranoia, psychosis is most consistent with medication noncompliance and schizophrenia. He meets criteria for IP based on current paranoia and delusions.  Current outpatient psychotropic medications include Abilify Maintena 400 mg, Suboxone 8-2 mg TID, Gabapentin 300 mg TID, Zoloft 100 mg daily, Artane 5 mg.  historically he has had a positive response to these medications. He was not compliant with medications prior to admission as evidenced by patient report. On initial examination, patient is disorganized, talking about republicans vs democrats, and reports auditory hallucinations. Please see plan below for detailed recommendations.   Diagnoses:  Active Hospital problems: Principal Problem:   Chronic paranoid schizophrenia (HCC) Active Problems:   Amphetamine abuse (HCC)    Plan   ## Psychiatric Medication Recommendations:  Appears patient received Abilify Maintenna on 02/13/24 at the [facility] is not due for another injection at this time.  - Continue Zoloft 100 mg daily - Continue Artane 5 mg TID  ## Medical  Decision Making Capacity: Not specifically addressed in this encounter  ## Further Work-up:  EKG, UDS pending   ## Disposition:-- We recommend inpatient psychiatric hospitalization when medically cleared. Patient is under voluntary admission status at this time; please IVC if attempts to leave hospital.  ## Behavioral / Environmental: - No specific recommendations at this time.     ## Safety and Observation Level:  - Based on my clinical evaluation, I estimate the patient to be at low risk of self harm in the current setting. - At this time, we recommend  routine. This decision is based on my review of the chart including patient's history and current presentation, interview of the patient, mental status examination, and consideration of suicide risk including evaluating suicidal ideation, plan, intent, suicidal or self-harm behaviors, risk factors, and protective factors. This judgment is based on our ability to directly address suicide risk, implement suicide prevention strategies, and develop a safety plan while the patient is in the clinical setting. Please contact our team if there is a concern that risk level has changed.  CSSR Risk Category:C-SSRS RISK CATEGORY: No Risk  Suicide Risk Assessment: Patient has following modifiable risk factors for suicide: social isolation, recklessness, and medication noncompliance, which we are addressing by inpatient treatment. Patient has following non-modifiable or demographic risk factors for suicide: male gender Patient has the following protective factors against suicide: Supportive family and Cultural, spiritual, or religious beliefs that discourage suicide  Thank you for this consult request. Recommendations have been communicated to the primary team.  We will recommend inpatient treatment at this time.   Eligha Bridegroom, NP       History of Present Illness  Relevant Aspects of Hospital ED Course:  Patient presents for psychiatric  complaints.  Medical history includes HTN, schizophrenia, migraine headaches, polysubstance abuse.  He was seen in the ED yesterday for left knee swelling and pain.  This surgery in December with Dr. Carola Frost.  He was advised to follow-up as an outpatient.  Today, he presents with paranoia and homicidal ideation.  States that he has not taken any of his home medications and has used recent methamphetamines.  He states that he was recently discharged from a psychiatric hospitalization on Thursday.  Since that time, he has not taken any mental medications.  He states that he is amnestic to events that occurred over 36 hours this weekend.  He endorses auditory hallucinations.   Patient Report:  PT seen at [facility] for face to face psychiatric evaluation. Pt does have moderate chronic tardive dyskinesia, and he is difficult to understand at times. Pt is also disorganized, he does appear to be paranoid. He has difficulty staying on conversation topic, and often will start talking about republicans vs. Democrats, defunding the police, how his parents are out to kill him. I asked the patient the last time he saw or spoke to his family and he is unsure. He is able to deny SI/HI. He does endorse auditory hallucinations. He tried to explain what he hears but I could not understand what he was saying due to his garbled speech. Pt does state he has severe tardive dyskenia, he takes Artane 5 mg TID. Pt did confirm relapse on meth. Confirms he has not been sleeping well. Confirms homelessness.   Pt has had irritability since arrival to ED. He was given Geodon 20 mg IM, Versed 2 mg IM, and Benadryl 25 mg IM. Pt is is currently voluntary and agreeable with treatment plans. Will be recommending inpatient psychiatric treatment. If patient were to attempt to leave AMA he would meed IVC criteria.   Review of Systems  Psychiatric/Behavioral:  Positive for hallucinations and substance abuse. The patient has insomnia.      Psychiatric  and Social History  Psychiatric History:  Information collected from patient, chart  Prev Dx/Sx: chronic schizophrenia Current Psych Provider: none Home Meds (current): abilify, zoloft Previous Med Trials: unknown Therapy: denies  Prior Psych Hospitalization: yes  Prior Self Harm: denies Prior Violence: unknown   Social History:  Occupational Hx: disability Legal Hx: denies Living Situation: homeless  Access to weapons/lethal means: denies   Substance History Alcohol: yes  Type of alcohol "anything" Last Drink yesterday Number of drinks per day "just depends" History of alcohol withdrawal seizures denies History of DT's denies Tobacco: yes Illicit drugs: yes, meth Prescription drug abuse:  Rehab hx: yes  Exam Findings  Physical Exam:  Vital Signs:  Temp:  [98.4 F (36.9 C)] 98.4 F (36.9 C) (03/25 1338) Pulse Rate:  [103] 103 (03/25 1338) Resp:  [20] 20 (03/25 1338) BP: (148)/(92) 148/92 (03/25 1338) SpO2:  [100 %] 100 % (03/25 1338) Weight:  [99.8 kg] 99.8 kg (03/25 1341) Blood pressure (!) 148/92, pulse (!) 103, temperature 98.4 F (36.9 C), temperature source Oral, resp. rate 20, height 6' (1.829 m), weight 99.8 kg, SpO2 100%. Body mass index is 29.84 kg/m.  Physical Exam Vitals and nursing note reviewed.  Neurological:     Mental Status: He is alert and oriented to person, place, and  time.     Mental Status Exam: General Appearance: Disheveled  Orientation:  Full (Time, Place, and Person)  Memory:  Immediate;   Fair Recent;   Fair  Concentration:  Concentration: Poor  Recall:  Fair  Attention  Fair  Eye Contact:  Fair  Speech:  Garbled  Language:  Fair  Volume:  Normal  Mood: "not good"  Affect:  Blunt  Thought Process:  Disorganized  Thought Content:  Illogical, Delusions, and Paranoid Ideation  Suicidal Thoughts:  No  Homicidal Thoughts:  No  Judgement:  Impaired  Insight:  Lacking  Psychomotor Activity:  TD  Akathisia:  No  Fund of  Knowledge:  Fair      Assets:  Desire for Improvement Resilience  Cognition:  WNL  ADL's:  Intact  AIMS (if indicated):        Other History   These have been pulled in through the EMR, reviewed, and updated if appropriate.  Family History:  The patient's family history is not on file.  Medical History: Past Medical History:  Diagnosis Date  . Drug abuse (HCC)   . Hypertension   . Schizophrenia (HCC)   . Tardive dyskinesia     Surgical History: Past Surgical History:  Procedure Laterality Date  . HIP PINNING,CANNULATED Left 11/27/2023   Procedure: PERCUTANEOUS FIXATION OF A DISTAL FEMUR FX;  Surgeon: Myrene Galas, MD;  Location: [facility] OR;  Service: Orthopedics;  Laterality: Left;  . PATELLA FRACTURE SURGERY Left 1983     Medications:   Current Facility-Administered Medications:  .  amLODipine (NORVASC) tablet 5 mg, 5 mg, Oral, Daily, Gloris Manchester, MD .  LORazepam (ATIVAN) tablet 1 mg, 1 mg, Oral, PRN, Gloris Manchester, MD .  sertraline (ZOLOFT) tablet 100 mg, 100 mg, Oral, Daily, Gloris Manchester, MD .  trihexyphenidyl (ARTANE) tablet 5 mg, 5 mg, Oral, TID WC, Gloris Manchester, MD  Current Outpatient Medications:  .  amLODipine (NORVASC) 5 MG tablet, Take 1 tablet (5 mg total) by mouth daily. (Patient not taking: Reported on 02/12/2024), Disp: 30 tablet, Rfl: 0 .  amphetamine-dextroamphetamine (ADDERALL) 10 MG tablet, Take 30 mg by mouth 2 (two) times daily. (Patient not taking: Reported on 02/12/2024), Disp: , Rfl:  .  apixaban (ELIQUIS) 2.5 MG TABS tablet, Take 1 tablet (2.5 mg total) by mouth 2 (two) times daily. (Patient not taking: Reported on 02/12/2024), Disp: 60 tablet, Rfl: 0 .  ARIPiprazole ER (ABILIFY MAINTENA) 400 MG SRER injection, Inject 2 mLs (400 mg total) into the muscle every 28 (twenty-eight) days., Disp: 2 mL, Rfl: 2 .  Buprenorphine HCl-Naloxone HCl 8-2 MG FILM, Place 1 Film under the tongue 3 (three) times daily., Disp: , Rfl:  .  gabapentin (NEURONTIN) 300 MG  capsule, Take 1 capsule (300 mg total) by mouth 3 (three) times daily. (Patient not taking: Reported on 02/12/2024), Disp: 90 capsule, Rfl: 0 .  sertraline (ZOLOFT) 100 MG tablet, Take 100 mg by mouth daily., Disp: , Rfl:  .  traZODone (DESYREL) 150 MG tablet, TAKE 1 TABLET (150 MG TOTAL) BY MOUTH AT BEDTIME FOR 15 DAYS., Disp: 90 tablet, Rfl: 1 .  trihexyphenidyl (ARTANE) 5 MG tablet, TAKE 1 TABLET BY MOUTH 3 TIMES DAILY WITH MEALS., Disp: 270 tablet, Rfl: 1  Allergies: Allergies  Allergen Reactions  . Fish Allergy Anaphylaxis  . Haloperidol Lactate Nausea And Vomiting  . Shellfish Allergy Anaphylaxis  . Cephalexin Nausea And Vomiting  . Cogentin [Benztropine] Hives  . Ingrezza [Valbenazine Tosylate] Hives  . Risperidone And  Related Other (See Comments)    Breasts grew  . Strawberry Extract Hives  . Geodon [Ziprasidone] Other (See Comments)    Extended erection (6 hours)    Eligha Bridegroom, NP

## 2024-02-26 NOTE — ED Notes (Signed)
 Pt had pocket knife on arrival, pt label placed on knife and this EMT handed it to Pharmacist, hospital to be secured.

## 2024-02-26 NOTE — ED Triage Notes (Addendum)
 Pt  with c/o of HI to everyone and is paranoid. Pt has history os schizophrenia and does not know the last day he took his meds. Pt also endorses meth use 2 days ago. During triage pt is agitated and restless. Going off on tangent when asking questions.  Denies SI

## 2024-02-27 MED ORDER — TRIHEXYPHENIDYL HCL 5 MG PO TABS
5.0000 mg | ORAL_TABLET | Freq: Three times a day (TID) | ORAL | Status: DC
  Administered 2024-02-27: 5 mg via ORAL
  Filled 2024-02-27 (×2): qty 1

## 2024-02-27 NOTE — ED Notes (Signed)
 IVC COMPLETE 3 COPIES ATTACHED TO CLIPBOARD IN PURPLE 1 IN MEDICAL RECORDS ORIGINAL IN RED FOLDER EXP 03/04/24 CASE#[id number]

## 2024-02-27 NOTE — ED Notes (Signed)
 [facility] to accept patient after 9 am;accepting is Loni Beckwith; Report can be called at [phone] option 2-Monique,RN

## 2024-02-27 NOTE — Progress Notes (Signed)
 Pt has been accepted to [facility] TODAY 02/27/2024  Bed assignment: Main campus  Pt meets inpatient criteria per: Eligha Bridegroom NP  Attending Physician will be Loni Beckwith, MD  Report can be called to: [phone] (this is a pager, please leave call-back number when giving report)  Pt can arrive ASAP   Care Team Notified: Arsenio Loader NP, Rozell Searing RN   [place] Zierra Laroque LCSW-A   02/27/2024 9:49 AM

## 2024-02-27 NOTE — ED Notes (Signed)
 IVC'd 02/26/24, exp 03/04/24; IVC docs in purple zone

## 2024-02-28 ENCOUNTER — Ambulatory Visit: Payer: Self-pay | Admitting: Family Medicine

## 2024-04-04 ENCOUNTER — Other Ambulatory Visit (HOSPITAL_COMMUNITY): Payer: Self-pay | Admitting: Physician Assistant

## 2024-04-04 DIAGNOSIS — F200 Paranoid schizophrenia: Secondary | ICD-10-CM

## 2024-11-09 ENCOUNTER — Emergency Department (HOSPITAL_COMMUNITY): Admission: EM | Admit: 2024-11-09 | Discharge: 2024-11-09 | Discharge status: Against Medical Advice

## 2024-11-09 NOTE — ED Notes (Signed)
 As soon as pt checked in, pt walked out the front door and seen going up sidewalk towards main road.

## 2024-11-22 ENCOUNTER — Emergency Department (HOSPITAL_COMMUNITY)
Admission: EM | Admit: 2024-11-22 | Discharge: 2024-11-24 | Disposition: A | Source: Intra-hospital | Attending: Student in an Organized Health Care Education/Training Program | Admitting: Student in an Organized Health Care Education/Training Program

## 2024-11-22 DIAGNOSIS — R059 Cough, unspecified: Secondary | ICD-10-CM | POA: Diagnosis not present

## 2024-11-22 DIAGNOSIS — F29 Unspecified psychosis not due to a substance or known physiological condition: Secondary | ICD-10-CM | POA: Diagnosis present

## 2024-11-22 DIAGNOSIS — Z79899 Other long term (current) drug therapy: Secondary | ICD-10-CM | POA: Diagnosis not present

## 2024-11-22 DIAGNOSIS — R519 Headache, unspecified: Secondary | ICD-10-CM | POA: Insufficient documentation

## 2024-11-22 DIAGNOSIS — F23 Brief psychotic disorder: Secondary | ICD-10-CM

## 2024-11-22 DIAGNOSIS — F250 Schizoaffective disorder, bipolar type: Secondary | ICD-10-CM | POA: Diagnosis not present

## 2024-11-22 DIAGNOSIS — Z5900 Homelessness unspecified: Secondary | ICD-10-CM | POA: Insufficient documentation

## 2024-11-22 DIAGNOSIS — Z7901 Long term (current) use of anticoagulants: Secondary | ICD-10-CM | POA: Diagnosis not present

## 2024-11-22 LAB — RESP PANEL BY RT-PCR (RSV, FLU A&B, COVID)  RVPGX2
Influenza A by PCR: NEGATIVE
Influenza B by PCR: NEGATIVE
Resp Syncytial Virus by PCR: NEGATIVE
SARS Coronavirus 2 by RT PCR: NEGATIVE

## 2024-11-22 MED ORDER — IBUPROFEN 400 MG PO TABS
600.0000 mg | ORAL_TABLET | Freq: Once | ORAL | Status: AC
  Administered 2024-11-22: 600 mg via ORAL
  Filled 2024-11-22: qty 1

## 2024-11-22 NOTE — ED Provider Notes (Signed)
 " [place] [facility] Provider Note   CSN: [id number] Arrival date & time: 11/22/24  8167     Patient presents with: Influenza and Psychiatric Evaluation   Cole Henderson is a 48 y.o. male.    Influenza 48 year old male brought in by EMS after he was found under an apartment acutely psychotic with flulike symptoms.  He reports muscle tenderness, cough, and headache.  He has a past medical history of schizophrenia and tardive dyskinesia. Patient appears unkept and is covered in careers adviser.  He is homeless and has been seen here in our emergency department multiple times in the past.  He has history of hallucinations secondary to schizophrenia.  He has previously been prescribed psychiatric medications but unclear if he is currently taking these.  He denies suicidal or homicidal ideation     Prior to Admission medications  Medication Sig Start Date End Date Taking? Authorizing Provider  amLODipine  (NORVASC ) 5 MG tablet Take 1 tablet (5 mg total) by mouth daily. Patient not taking: Reported on 02/12/2024 02/01/23 03/03/23  Pahwani, Ravi, MD  amphetamine-dextroamphetamine (ADDERALL) 10 MG tablet Take 30 mg by mouth 2 (two) times daily. Patient not taking: Reported on 02/12/2024 10/04/23   [provider]  apixaban  (ELIQUIS ) 2.5 MG TABS tablet Take 1 tablet (2.5 mg total) by mouth 2 (two) times daily. Patient not taking: Reported on 02/12/2024 11/29/23 12/29/23  Deward Francis, PA-C  ARIPiprazole  ER (ABILIFY  MAINTENA) 400 MG SRER injection Inject 2 mLs (400 mg total) into the muscle every 28 (twenty-eight) days. 12/14/23   Nwoko, Uchenna E, PA  Buprenorphine  HCl-Naloxone  HCl 8-2 MG FILM Place 1 Film under the tongue 3 (three) times daily. 02/03/24   [provider]  gabapentin  (NEURONTIN ) 300 MG capsule Take 1 capsule (300 mg total) by mouth 3 (three) times daily. Patient not taking: Reported on 02/12/2024 12/08/23   Vicci Burnard SAUNDERS, PA-C   sertraline  (ZOLOFT ) 100 MG tablet Take 100 mg by mouth daily.    [provider]  traZODone  (DESYREL ) 150 MG tablet TAKE 1 TABLET (150 MG TOTAL) BY MOUTH AT BEDTIME FOR 15 DAYS. 12/21/23 01/05/24  Nwoko, Uchenna E, PA  trihexyphenidyl  (ARTANE ) 5 MG tablet TAKE 1 TABLET BY MOUTH 3 TIMES DAILY WITH MEALS. 12/14/23   Nwoko, Uchenna E, PA    Allergies: Fish allergy, Shellfish allergy, Cephalexin, Haloperidol  lactate, Cogentin [benztropine], Geodon  [ziprasidone ], Ingrezza [valbenazine tosylate], Risperidone and paliperidone, and Strawberry extract    Review of Systems  All other systems reviewed and are negative.   Updated Vital Signs BP (!) 143/125   Pulse (!) 104   Temp (!) 97 F (36.1 C)   Resp 18   SpO2 98%   Physical Exam Vitals and nursing note reviewed.  Constitutional:      Comments: Unkempt, disheveled  HENT:     Nose: Nose normal.  Eyes:     Conjunctiva/sclera: Conjunctivae normal.  Cardiovascular:     Rate and Rhythm: Normal rate.  Pulmonary:     Effort: Pulmonary effort is normal.  Musculoskeletal:        General: Normal range of motion.     Cervical back: Neck supple. No rigidity.  Skin:    General: Skin is warm.  Neurological:     General: No focal deficit present.     Motor: No weakness.     Gait: Gait normal.  Psychiatric:        Mood and Affect: Affect is inappropriate.  Speech: Speech is rapid and pressured.        Behavior: Behavior is agitated.        Judgment: Judgment is inappropriate.     (all labs ordered are listed, but only abnormal results are displayed) Labs Reviewed  RESP PANEL BY RT-PCR (RSV, FLU A&B, COVID)  [id number]    EKG: None  Radiology: No results found.   Procedures   Medications Ordered in the ED  ibuprofen  (ADVIL ) tablet 600 mg (600 mg Oral Given 11/22/24 1924)                                    Medical Decision Making  48 year old male brought to emergency department for psychiatric evaluation.  He  does report some flulike symptoms and was given Motrin  here in the ED.  We do not suspect he has evidence of meningitis as he is afebrile and has normal neck range of motion.  He is homeless and showing evidence of acute psychosis.  He is agitated and yelling at staff.  He was covered in dirty clothing and did change into clean clothing while here in the ED.  TTS consulted for psychiatric evaluation He has no evidence of trauma and is able to ambulate appropriately. He has not required any medications and has been cooperative Patient pending TTS consult  Final diagnoses:  Acute psychosis [facility])    ED Discharge Orders     None          Jonatan Wilsey, DO 11/22/24 2236  "

## 2024-11-22 NOTE — ED Triage Notes (Signed)
 Pt BIB [facility] [place] under apartment. Pt is homeless. Pt exhibiting flu like symptoms along w stiff neck, tender to touch, cough and headache?SABRA Afebrile. Hx schizophrenia and tardive dyskinesia. VSS.

## 2024-11-22 NOTE — BH Assessment (Signed)
 Clinician spoke with [name] to complete pt's TTS assessment. Clinician provided pt's name, MRN, location, age, room number and provider's name. Secure message completed.    [name] coordinator to update secure chat when assessment time and provider are assigned.  [name], MS, [facility], [facility] Triage Specialist [phone]

## 2024-11-23 ENCOUNTER — Emergency Department (HOSPITAL_COMMUNITY)

## 2024-11-23 DIAGNOSIS — F250 Schizoaffective disorder, bipolar type: Secondary | ICD-10-CM | POA: Diagnosis not present

## 2024-11-23 DIAGNOSIS — F29 Unspecified psychosis not due to a substance or known physiological condition: Secondary | ICD-10-CM | POA: Diagnosis not present

## 2024-11-23 LAB — COMPREHENSIVE METABOLIC PANEL WITH GFR
ALT: 40 U/L (ref 0–44)
AST: 41 U/L (ref 15–41)
Albumin: 4.4 g/dL (ref 3.5–5.0)
Alkaline Phosphatase: 72 U/L (ref 38–126)
Anion gap: 8 (ref 5–15)
BUN: 16 mg/dL (ref 6–20)
CO2: 30 mmol/L (ref 22–32)
Calcium: 9.3 mg/dL (ref 8.9–10.3)
Chloride: 101 mmol/L (ref 98–111)
Creatinine, Ser: 1.04 mg/dL (ref 0.61–1.24)
GFR, Estimated: >60 mL/min
Glucose, Bld: 116 mg/dL — ABNORMAL HIGH (ref 70–99)
Potassium: 4.8 mmol/L (ref 3.5–5.1)
Sodium: 140 mmol/L (ref 135–145)
Total Bilirubin: 0.5 mg/dL (ref 0.0–1.2)
Total Protein: 7.2 g/dL (ref 6.5–8.1)

## 2024-11-23 LAB — CBC WITH DIFFERENTIAL/PLATELET
Abs Immature Granulocytes: 0.01 K/uL (ref 0.00–0.07)
Basophils Absolute: 0.1 K/uL (ref 0.0–0.1)
Basophils Relative: 1 %
Eosinophils Absolute: 0.6 K/uL — ABNORMAL HIGH (ref 0.0–0.5)
Eosinophils Relative: 8 %
HCT: 39.7 % (ref 39.0–52.0)
Hemoglobin: 13 g/dL (ref 13.0–17.0)
Immature Granulocytes: 0 %
Lymphocytes Relative: 25 %
Lymphs Abs: 1.8 K/uL (ref 0.7–4.0)
MCH: 30.1 pg (ref 26.0–34.0)
MCHC: 32.7 g/dL (ref 30.0–36.0)
MCV: 91.9 fL (ref 80.0–100.0)
Monocytes Absolute: 0.6 K/uL (ref 0.1–1.0)
Monocytes Relative: 8 %
Neutro Abs: 4 K/uL (ref 1.7–7.7)
Neutrophils Relative %: 58 %
Platelets: 345 K/uL (ref 150–400)
RBC: 4.32 MIL/uL (ref 4.22–5.81)
RDW: 12.7 % (ref 11.5–15.5)
WBC: 7 K/uL (ref 4.0–10.5)
nRBC: 0 % (ref 0.0–0.2)

## 2024-11-23 LAB — URINE DRUG SCREEN
Amphetamines: NEGATIVE
Barbiturates: NEGATIVE
Benzodiazepines: NEGATIVE
Cocaine: NEGATIVE
Fentanyl: NEGATIVE
Methadone Scn, Ur: NEGATIVE
Opiates: NEGATIVE
Tetrahydrocannabinol: NEGATIVE

## 2024-11-23 LAB — CK: Total CK: 1072 U/L — ABNORMAL HIGH (ref 49–397)

## 2024-11-23 LAB — ETHANOL: Alcohol, Ethyl (B): <15 mg/dL

## 2024-11-23 MED ORDER — STERILE WATER FOR INJECTION IJ SOLN
INTRAMUSCULAR | Status: AC
  Administered 2024-11-23: 1.2 mL
  Filled 2024-11-23: qty 10

## 2024-11-23 MED ORDER — TRIHEXYPHENIDYL HCL 5 MG PO TABS
5.0000 mg | ORAL_TABLET | Freq: Three times a day (TID) | ORAL | Status: DC
  Administered 2024-11-23 – 2024-11-24 (×4): 5 mg via ORAL
  Filled 2024-11-23 (×7): qty 1

## 2024-11-23 MED ORDER — ARIPIPRAZOLE 10 MG PO TABS
10.0000 mg | ORAL_TABLET | Freq: Every day | ORAL | Status: DC
  Administered 2024-11-23 – 2024-11-24 (×2): 10 mg via ORAL
  Filled 2024-11-23 (×2): qty 1

## 2024-11-23 MED ORDER — SERTRALINE HCL 100 MG PO TABS
100.0000 mg | ORAL_TABLET | Freq: Every day | ORAL | Status: DC
  Administered 2024-11-23 – 2024-11-24 (×2): 100 mg via ORAL
  Filled 2024-11-23 (×2): qty 1

## 2024-11-23 MED ORDER — ARIPIPRAZOLE 10 MG PO TABS
10.0000 mg | ORAL_TABLET | ORAL | Status: AC
  Administered 2024-11-23: 10 mg via ORAL
  Filled 2024-11-23: qty 1

## 2024-11-23 MED ORDER — OLANZAPINE 10 MG IM SOLR
5.0000 mg | Freq: Four times a day (QID) | INTRAMUSCULAR | Status: DC | PRN
  Administered 2024-11-23: 5 mg via INTRAMUSCULAR
  Filled 2024-11-23 (×2): qty 10

## 2024-11-23 MED ORDER — OLANZAPINE 5 MG PO TBDP
5.0000 mg | ORAL_TABLET | Freq: Four times a day (QID) | ORAL | Status: DC | PRN
  Administered 2024-11-23: 5 mg via ORAL
  Filled 2024-11-23 (×2): qty 1

## 2024-11-23 MED ORDER — ACETAMINOPHEN 325 MG PO TABS
650.0000 mg | ORAL_TABLET | Freq: Four times a day (QID) | ORAL | Status: DC | PRN
  Administered 2024-11-23 (×2): 650 mg via ORAL
  Filled 2024-11-23 (×2): qty 2

## 2024-11-23 MED ORDER — HYDROXYZINE HCL 50 MG PO TABS
50.0000 mg | ORAL_TABLET | Freq: Four times a day (QID) | ORAL | Status: DC | PRN
  Administered 2024-11-24: 50 mg via ORAL
  Filled 2024-11-23: qty 2

## 2024-11-23 MED ORDER — TRAZODONE HCL 50 MG PO TABS
150.0000 mg | ORAL_TABLET | Freq: Every day | ORAL | Status: DC
  Administered 2024-11-23 (×2): 150 mg via ORAL
  Filled 2024-11-23 (×2): qty 3

## 2024-11-23 MED ORDER — SODIUM CHLORIDE 0.9 % IV BOLUS
1000.0000 mL | Freq: Once | INTRAVENOUS | Status: AC
  Administered 2024-11-23: 1000 mL via INTRAVENOUS

## 2024-11-23 NOTE — ED Notes (Signed)
 IVC case#[id number]

## 2024-11-23 NOTE — Progress Notes (Signed)
 Patient has been denied by [facility] due to no appropriate beds available. Patient meets BH inpatient criteria per Adriana Pontes, MD. Patient has been faxed out to the following facilities:   [facility]  [address] [place] [address] [phone]  [facility]  [address] [place] [address] [phone]  [facility]  [address] [place] [address] [phone]  [facility]  [address], [place] [address] [phone]  [facility] [place]  [address] [place], [place] [address] [phone]  [facility] Patient Placement  [facility], [place] [phone]  [facility]  [address], [place] [address] [phone]  [facility]  [address] [place] [address] [phone]  [facility]  [address] [place], [place] [phone]  [facility]  [address] [place], [place] [address] [phone]  [facility]  [address] [place] [address] [phone]  [facility]  [address] [place] [address] [phone]  [facility]  [address] [place] [address] [phone]  [facility]  [address] [place] [address] [phone]  [facility]  [address] [place] [address] [phone]  [facility]  [address] [place] [address] [phone]  [facility]  [address], [place]  [address] [phone]  [facility]  [address] [place] [address] [phone]  [facility] [address]  [place] [address] [phone]  [facility]  [address] [place] [address] [phone]   Bunnie Gallop, MSW, LCSW-A  9:56 AM 11/23/2024

## 2024-11-23 NOTE — ED Provider Notes (Addendum)
 Emergency Medicine Observation Re-evaluation Note  Cole Henderson is a 48 y.o. male, seen on rounds today.  Pt initially presented to the ED for complaints of Influenza and Psychiatric Evaluation Currently, the patient is asleep.  Physical Exam  BP 107/71 (BP Location: Left Arm)   Pulse 85   Temp 98.2 F (36.8 C) (Oral)   Resp 18   SpO2 99%  Physical Exam General: asleep Cardiac: asleep Lungs: asleep Psych: asleep  ED Course / MDM  EKG:   I have reviewed the labs performed to date as well as medications administered while in observation.  Recent changes in the last 24 hours include home/psych meds being ordered.  Plan  Current plan is for inpatient psychiatric admission. Likely [facility]    Freddi Hamilton, MD 11/23/24 1114  Addendum: I was asked to see patient as he is attempting to leave.  The patient is endorsing a headache for the past 2 weeks without trauma.  No neck stiffness on exam.  Psychiatry is recommending inpatient admission.  No labs were checked yesterday, will check some labs but also check CT head as his history is unreliable.  Vital signs including temperature are normal. Will IVC for his acute psychiatric condition.  Labs show a normal creatinine but a CK of around 1000.  Will give some fluids and check a urine.    Freddi Hamilton, MD 11/23/24 2021

## 2024-11-23 NOTE — Consult Note (Signed)
 [name] Consult Note  Patient Name: Cole Henderson MRN: [id number] DOB: 03/09/1976 DATE OF Consult: 11/23/2024 Consult Order details:  Orders (From admission, onward)     Start     Ordered   11/22/24 1904  CONSULT TO CALL ACT TEAM       Ordering Provider: Corinthia No, DO  Provider:  (Not yet assigned)  Question:  Reason for Consult?  Answer:  Acute psychosis   11/22/24 1903            PRIMARY PSYCHIATRIC DIAGNOSES   1.  Schizoaffective Disorder, Bipolar   RECOMMENDATIONS  Recommendations: Medication recommendations: Restart:  Abilify  10 mg every day for mood control/psychosis (additional dose tonight); Artane , 5 mg tid for EPS (dose tonight); Zoloft , 100 mg every day for depression/anxiety; trazodone , 150 mg at bedtime for depression/anxiety/sleep.  PRN's:  hydroxyzine , 50 mg q6h PRN anxiety; Zyprexa  Zydis, 5 mg SL q6h PRN agitaiton; Zyprexa  5 mg IM q6h PRN severe agitation/aggression. Non-Medication/therapeutic recommendations: Patient is quite disorganized and unable to complete ADL's, so will continue to need close observation until he can safely be admitted to Psychiatry.  Continue with matter-of-fact emotional support in ED, pending transfer Is inpatient psychiatric hospitalization recommended for this patient? Yes (Explain why): Patient has been off his meds x 3 months, and his thoughts are quite disorganized, preventing him from completing basic ADL's.  Meets inpatient criteria for admission to maintain safety and get him back on meds. Is another care setting recommended for this patient? (examples may include Crisis Stabilization Unit, Residential/Recovery Treatment, ALF/SNF, Memory Care Unit)  No (Explain why): As above From a psychiatric perspective, is this patient appropriate for discharge to an outpatient setting/resource or other less restrictive environment for continued care?  No (Explain why): As above Follow-Up Telepsychiatry C/L services: We will sign off  for now. Please re-consult our service if needed for any concerning changes in the patient's condition, discharge planning, or questions. Communication: Treatment team members (and family members if applicable) who were involved in treatment/care discussions and planning, and with whom we spoke or engaged with via secure text/chat, include the following: Secure message sent to Mr. Evalene, RN, who will notify Dr. Trine, ED attending, of recommendations.  I personally spent a total of 30 minutes in the care of the patient today including preparing to see the patient, getting/reviewing separately obtained history, performing a medically appropriate exam/evaluation, counseling and educating, placing orders, referring and communicating with other health care professionals, documenting clinical information in the EHR, and communicating results.  Thank you for involving us  in the care of this patient. If you have any additional questions or concerns, please call [phone] and ask for the provider on-call.   TELEPSYCHIATRY ATTESTATION & CONSENT   As the provider for this telehealth consult, I attest that I verified the patients identity using two separate identifiers, introduced myself to the patient, provided my credentials, disclosed my location, and performed this encounter via a HIPAA-compliant, real-time, face-to-face, two-way, interactive audio and video platform and with the full consent and agreement of the patient (or guardian as applicable.)   Patient physical location: ED, [facility]. Telehealth provider physical location: home office in state of [place] .  Video start time: 2355h EST  Video end time: 0010h EST   IDENTIFYING DATA  Cole Henderson is a 48 y.o. year-old male for whom a psychiatric consultation has been ordered by the primary provider. The patient was identified using two separate identifiers.  CHIEF COMPLAINT/REASON FOR CONSULT   I can't  even think straight.  The  voices are constant. I can't make them stop.   HISTORY OF PRESENT ILLNESS (HPI)  The patient presents with longstanding history of schizoaffective disorder, with past history of multiple hospitalizations for inability to care for himself. Now presents having been off any meds in the past 4 months:  had previously been maintained on Abilify  Maintenna, but unclear when he last took that medication.  Patient reports that he often drinks to manage his auditory hallucinations, which never leave me alone when he is off meds.  Denies any command hallucinations, but I can't do anything when they're bad.  That's all I can focus on.  Was found in the crawlspace of an apartment building by authorities, who brought him to hospital because his thoughts were so disorganized.  Denies suicidal/homicidal ideation.  Reports history of EtOh abuse, but has not been drinking, as has had no resources.  Did not report other drugs.  UDS/BAL pending.   PAST PSYCHIATRIC HISTORY  As above Otherwise as per HPI above.  PAST MEDICAL HISTORY  Past Medical History:  Diagnosis Date   Drug abuse (HCC)    Hypertension    Schizophrenia (HCC)    Tardive dyskinesia      HOME MEDICATIONS  Facility Ordered Medications  Medication   [COMPLETED] ibuprofen  (ADVIL ) tablet 600 mg   ARIPiprazole  (ABILIFY ) tablet 10 mg   Followed by   ARIPiprazole  (ABILIFY ) tablet 10 mg   trihexyphenidyl  (ARTANE ) tablet 5 mg   sertraline  (ZOLOFT ) tablet 100 mg   traZODone  (DESYREL ) tablet 150 mg   hydrOXYzine  (ATARAX ) tablet 50 mg   OLANZapine  zydis (ZYPREXA ) disintegrating tablet 5 mg   OLANZapine  (ZYPREXA ) injection 5 mg   PTA Medications  Medication Sig   amphetamine-dextroamphetamine (ADDERALL) 10 MG tablet Take 30 mg by mouth 2 (two) times daily. (Patient not taking: Reported on 02/12/2024)   gabapentin  (NEURONTIN ) 300 MG capsule Take 1 capsule (300 mg total) by mouth 3 (three) times daily. (Patient not taking: Reported on 02/12/2024)    ARIPiprazole  ER (ABILIFY  MAINTENA) 400 MG SRER injection Inject 2 mLs (400 mg total) into the muscle every 28 (twenty-eight) days.   trihexyphenidyl  (ARTANE ) 5 MG tablet TAKE 1 TABLET BY MOUTH 3 TIMES DAILY WITH MEALS.   Buprenorphine  HCl-Naloxone  HCl 8-2 MG FILM Place 1 Film under the tongue 3 (three) times daily.   sertraline  (ZOLOFT ) 100 MG tablet Take 100 mg by mouth daily.     ALLERGIES  Allergies[1]  SOCIAL & SUBSTANCE USE HISTORY  Social History   Socioeconomic History   Marital status: Single    Spouse name: Not on file   Number of children: Not on file   Years of education: Not on file   Highest education level: Not on file  Occupational History   Not on file  Tobacco Use   Smoking status: Every Day    Current packs/day: 0.50    Types: Cigarettes   Smokeless tobacco: Never  Vaping Use   Vaping status: Some Days  Substance and Sexual Activity   Alcohol use: Yes    Comment: occ   Drug use: Yes    Types: Methamphetamines   Sexual activity: Not on file  Other Topics Concern   Not on file  Social History Narrative   Not on file   Social Drivers of Health   Tobacco Use: High Risk (03/17/2024)   Received from [facility]   Patient History    Smoking Tobacco Use: Every Day  Smokeless Tobacco Use: Never    Passive Exposure: Not on file  Financial Resource Strain: High Risk (11/11/2023)   Received from [facility]   Overall Financial Resource Strain (CARDIA)    Difficulty of Paying Living Expenses: Very hard  Food Insecurity: Food Insecurity Present (02/12/2024)   Hunger Vital Sign    Worried About Running Out of Food in the Last Year: Often true    Ran Out of Food in the Last Year: Often true  Transportation Needs: Unmet Transportation Needs (02/12/2024)   PRAPARE - Administrator, Civil Service (Medical): Yes    Lack of Transportation (Non-Medical): Yes  Physical Activity: Not on file  Stress: Not on file  Social Connections:  Unknown (04/15/2022)   Received from [facility]   Social Network    Social Network: Not on file  Depression (PHQ2-9): Medium Risk (02/12/2024)   Depression (PHQ2-9)    PHQ-2 Score: 7  Alcohol Screen: Low Risk (09/04/2022)   Alcohol Screen    Last Alcohol Screening Score (AUDIT): 0  Housing: High Risk (11/26/2023)   Housing Stability Vital Sign    Unable to Pay for Housing in the Last Year: Yes    Number of Times Moved in the Last Year: 2    Homeless in the Last Year: Yes  Utilities: Not At Risk (02/12/2024)   AHC Utilities    Threatened with loss of utilities: No  Health Literacy: Not on file   Tobacco Use History[2] Social History   Substance and Sexual Activity  Alcohol Use Yes   Comment: occ   Social History   Substance and Sexual Activity  Drug Use Yes   Types: Methamphetamines    .  FAMILY HISTORY  No family history on file. Family Psychiatric History (if known):  Did not report  MENTAL STATUS EXAM (MSE)  Mental Status Exam: General Appearance: Disheveled  Orientation:  Full (Time, Place, and Person)  Memory:  Immediate;   Fair Recent;   Fair Remote;   Fair  Concentration:  Concentration: Poor and Attention Span: Poor  Recall:  Fair  Attention  Poor  Eye Contact:  Minimal  Speech:  Pressured  Language:  Good  Volume:  Normal  Mood: I can't keep on like this  Affect:  Labile  Thought Process:  Disorganized and Descriptions of Associations: Loose  Thought Content:  Hallucinations: Auditory, Rumination, and Tangential  Suicidal Thoughts:  No  Homicidal Thoughts:  No  Judgement:  Impaired  Insight:  Shallow  Psychomotor Activity:  Increased  Akathisia:  No  Fund of Knowledge:  Fair    Assets:  Communication Skills Desire for Improvement  Cognition:  WNL  ADL's:  Impaired  AIMS (if indicated):       VITALS  Blood pressure (!) 129/101, pulse 99, temperature 98.7 F (37.1 C), temperature source Oral, resp. rate 18, SpO2 100%.  LABS  Admission on  11/22/2024  Component Date Value Ref Range Status   SARS Coronavirus 2 by RT PCR 11/22/2024 NEGATIVE  NEGATIVE Final   Influenza A by PCR 11/22/2024 NEGATIVE  NEGATIVE Final   Influenza B by PCR 11/22/2024 NEGATIVE  NEGATIVE Final   Comment: (NOTE) The Xpert Xpress SARS-CoV-2/FLU/RSV plus assay is intended as an aid in the diagnosis of influenza from Nasopharyngeal swab specimens and should not be used as a sole basis for treatment. Nasal washings and aspirates are unacceptable for Xpert Xpress SARS-CoV-2/FLU/RSV testing.  Fact Sheet for Patients: bloggercourse.com  Fact Sheet for  Healthcare Providers: seriousbroker.it  This test is not yet approved or cleared by the [place]  FDA and has been authorized for detection and/or diagnosis of SARS-CoV-2 by FDA under an Emergency Use Authorization (EUA). This EUA will remain in effect (meaning this test can be used) for the duration of the COVID-19 declaration under Section 564(b)(1) of the Act, 21 U.S.C. section 360bbb-3(b)(1), unless the authorization is terminated or revoked.     Resp Syncytial Virus by PCR 11/22/2024 NEGATIVE  NEGATIVE Final   Comment: (NOTE) Fact Sheet for Patients: bloggercourse.com  Fact Sheet for Healthcare Providers: seriousbroker.it  This test is not yet approved or cleared by the [place]  FDA and has been authorized for detection and/or diagnosis of SARS-CoV-2 by FDA under an Emergency Use Authorization (EUA). This EUA will remain in effect (meaning this test can be used) for the duration of the COVID-19 declaration under Section 564(b)(1) of the Act, 21 U.S.C. section 360bbb-3(b)(1), unless the authorization is terminated or revoked.  Performed at [facility], [address], [place], [place] [address]     PSYCHIATRIC REVIEW OF SYSTEMS (ROS)  ROS: Notable for the following relevant  positive findings: Review of Systems  Constitutional:  Positive for malaise/fatigue.  HENT: Negative.    Eyes: Negative.   Respiratory: Negative.    Cardiovascular: Negative.   Gastrointestinal:  Positive for nausea.  Genitourinary: Negative.   Musculoskeletal:  Positive for myalgias.  Skin: Negative.   Neurological: Negative.   Endo/Heme/Allergies: Negative.   Psychiatric/Behavioral:  Positive for depression, hallucinations and substance abuse. The patient is nervous/anxious.     Additional findings:      Musculoskeletal: No abnormal movements observed      Gait & Station: Laying/Sitting      Pain Screening: Denies      Nutrition & Dental Concerns:  Reviewed   RISK FORMULATION/ASSESSMENT  Is the patient experiencing any suicidal or homicidal ideations: No       Explain if yes:   However, patient's thoughts are quite disorganized, which are preventing him from meeting ADL's adequately.  Protective factors considered for safety management:   Patient has little community support and no regular source of care  Risk factors/concerns considered for safety management:  Depression Substance abuse/dependence Recent loss Hopelessness Impulsivity Isolation Barriers to accessing treatment Male gender Unmarried  Is there a safety management plan with the patient and treatment team to minimize risk factors and promote protective factors: No            Explain: As above, patient without any adequate support in the community  Is crisis care placement or psychiatric hospitalization recommended: Yes     Based on my current evaluation and risk assessment, patient is determined at this time to be at:  High risk  *RISK ASSESSMENT Risk assessment is a dynamic process; it is possible that this patient's condition, and risk level, may change. This should be re-evaluated and managed over time as appropriate. Please re-consult psychiatric consult services if additional assistance is  needed in terms of risk assessment and management. If your team decides to discharge this patient, please advise the patient how to best access emergency psychiatric services, or to call 911, if their condition worsens or they feel unsafe in any way.   Adriana JINNY Pontes, MD Telepsychiatry Consult Services    [1]  Allergies Allergen Reactions   Fish Allergy Anaphylaxis   Shellfish Allergy Anaphylaxis   Cephalexin Nausea And Vomiting   Haloperidol  Lactate Nausea And Vomiting  Cogentin [Benztropine] Hives   Geodon  [Ziprasidone ] Other (See Comments)    Extended erection (6 hours)   Ingrezza [Valbenazine Tosylate] Hives   Risperidone And Paliperidone Other (See Comments)    Breasts grew   Strawberry Extract Hives  [2]  Social History Tobacco Use  Smoking Status Every Day   Current packs/day: 0.50   Types: Cigarettes  Smokeless Tobacco Never

## 2024-11-23 NOTE — ED Notes (Signed)
 Pt placed phone, and license in backpack. Pt requested backpack be kept with other belonging bags.

## 2024-11-23 NOTE — ED Notes (Signed)
Pt given phone call.  

## 2024-11-23 NOTE — ED Notes (Signed)
IVC paperwork is in purple zone 

## 2024-11-23 NOTE — ED Notes (Signed)
 Patient saying he was leaving AMA, Rn told him he is IVC patient went in the room and started screaming. He was given something for agitation.

## 2024-11-23 NOTE — ED Notes (Signed)
 IVC paper work building services engineer. Envelope#[id number] paperwork located in purple w/pt. Cole Henderson 3:37pm

## 2024-11-23 NOTE — Progress Notes (Signed)
 [facility] LCSW Progress Note   11/23/2024    10:24 AM  Cole Henderson   [id number]   Type of Contact and Topic:  Psychiatric Bed Placement   Pt accepted to [facility]    Patient meets inpatient criteria per Adriana Pontes, MD   The attending provider will be Dr. Odella Jumper  Call report to [phone]  Flavia Blackbird, RN @[facility]  ED notified.     Pt scheduled  to arrive at [facility] for TOMORROW (12/21).    Bunnie Gallop, MSW, LCSW-A  10:25 AM 11/23/2024

## 2024-11-23 NOTE — ED Notes (Signed)
 Patient got agitated, asking for breakfast. He was give a turkey sandwich and a drink.

## 2024-11-23 NOTE — ED Notes (Signed)
Patient in room talking to himself

## 2024-11-24 DIAGNOSIS — F29 Unspecified psychosis not due to a substance or known physiological condition: Secondary | ICD-10-CM | POA: Diagnosis not present

## 2024-11-24 LAB — URINALYSIS, ROUTINE W REFLEX MICROSCOPIC
Bilirubin Urine: NEGATIVE
Glucose, UA: NEGATIVE mg/dL
Hgb urine dipstick: NEGATIVE
Ketones, ur: NEGATIVE mg/dL
Leukocytes,Ua: NEGATIVE
Nitrite: NEGATIVE
Protein, ur: NEGATIVE mg/dL
Specific Gravity, Urine: <1.005 — ABNORMAL LOW (ref 1.005–1.030)
pH: 6.5 (ref 5.0–8.0)

## 2024-11-24 MED ORDER — DIPHENHYDRAMINE HCL 50 MG/ML IJ SOLN
12.5000 mg | Freq: Once | INTRAMUSCULAR | Status: AC
  Administered 2024-11-24: 12.5 mg via INTRAVENOUS
  Filled 2024-11-24: qty 1

## 2024-11-24 MED ORDER — DROPERIDOL 2.5 MG/ML IJ SOLN
1.2500 mg | Freq: Once | INTRAMUSCULAR | Status: AC
  Administered 2024-11-24: 1.25 mg via INTRAVENOUS
  Filled 2024-11-24: qty 2

## 2024-11-24 MED ORDER — MIDAZOLAM HCL (PF) 2 MG/2ML IJ SOLN
2.0000 mg | Freq: Once | INTRAMUSCULAR | Status: AC
  Administered 2024-11-24: 2 mg via INTRAVENOUS
  Filled 2024-11-24: qty 2

## 2024-11-24 NOTE — ED Provider Notes (Addendum)
 Emergency Medicine Observation Re-evaluation Note  Cole Henderson is a 48 y.o. male, seen on rounds today.  Pt initially presented to the ED for complaints of Influenza and Psychiatric Evaluation Currently, the patient is resting comfortably.  Physical Exam  BP (!) 140/60 (BP Location: Left Arm)   Pulse 78   Temp 98.6 F (37 C) (Axillary)   Resp 18   SpO2 96%  Physical Exam General: nad Cardiac: good peripheral perfusion Lungs: bilateral chest rise Psych: resting comfortably   ED Course / MDM  EKG:   I have reviewed the labs performed to date as well as medications administered while in observation.  Recent changes in the last 24 hours include home/psych meds being ordered.  Plan  Current plan is for inpatient psychiatric admission. Patient has bed at [facility], awaiting transport.  Transport is now available.  Patient remains medically clear for transport.        Emil Share, DO 11/24/24 [phone]

## 2024-11-24 NOTE — ED Notes (Signed)
 EKG and blood work results faxed to [phone].

## 2024-11-24 NOTE — ED Notes (Signed)
 Case Number: [id number]

## 2024-11-24 NOTE — ED Notes (Signed)
 Paperwork (Labs/EKG) given to secretary Nichole to be faxed to [facility] as requested.

## 2024-11-24 NOTE — ED Notes (Signed)
 Patient has become extremely verbally aggressive towards sitter and nursing staff. Patient started to wonder towards yellow zone. Security has been called, and pt is back in the bed.

## 2024-12-14 ENCOUNTER — Other Ambulatory Visit: Payer: Self-pay

## 2024-12-14 ENCOUNTER — Emergency Department (HOSPITAL_COMMUNITY)
Admission: EM | Admit: 2024-12-14 | Discharge: 2024-12-15 | Attending: Emergency Medicine | Admitting: Emergency Medicine

## 2024-12-14 DIAGNOSIS — R440 Auditory hallucinations: Secondary | ICD-10-CM | POA: Diagnosis present

## 2024-12-14 DIAGNOSIS — Z5321 Procedure and treatment not carried out due to patient leaving prior to being seen by health care provider: Secondary | ICD-10-CM | POA: Diagnosis not present

## 2024-12-14 LAB — COMPREHENSIVE METABOLIC PANEL WITH GFR
ALT: 28 U/L (ref 0–44)
AST: 35 U/L (ref 15–41)
Albumin: 4 g/dL (ref 3.5–5.0)
Alkaline Phosphatase: 64 U/L (ref 38–126)
Anion gap: 11 (ref 5–15)
BUN: 14 mg/dL (ref 6–20)
CO2: 26 mmol/L (ref 22–32)
Calcium: 8.6 mg/dL — ABNORMAL LOW (ref 8.9–10.3)
Chloride: 101 mmol/L (ref 98–111)
Creatinine, Ser: 0.72 mg/dL (ref 0.61–1.24)
GFR, Estimated: >60 mL/min
Glucose, Bld: 109 mg/dL — ABNORMAL HIGH (ref 70–99)
Potassium: 3.9 mmol/L (ref 3.5–5.1)
Sodium: 137 mmol/L (ref 135–145)
Total Bilirubin: 0.4 mg/dL (ref 0.0–1.2)
Total Protein: 6.5 g/dL (ref 6.5–8.1)

## 2024-12-14 LAB — CBC
HCT: 33.8 % — ABNORMAL LOW (ref 39.0–52.0)
Hemoglobin: 11.3 g/dL — ABNORMAL LOW (ref 13.0–17.0)
MCH: 29.7 pg (ref 26.0–34.0)
MCHC: 33.4 g/dL (ref 30.0–36.0)
MCV: 88.9 fL (ref 80.0–100.0)
Platelets: 212 K/uL (ref 150–400)
RBC: 3.8 MIL/uL — ABNORMAL LOW (ref 4.22–5.81)
RDW: 12.8 % (ref 11.5–15.5)
WBC: 7.2 K/uL (ref 4.0–10.5)
nRBC: 0 % (ref 0.0–0.2)

## 2024-12-14 LAB — URINE DRUG SCREEN
Amphetamines: NEGATIVE
Barbiturates: NEGATIVE
Benzodiazepines: NEGATIVE
Cocaine: POSITIVE — AB
Fentanyl: NEGATIVE
Methadone Scn, Ur: NEGATIVE
Opiates: NEGATIVE
Tetrahydrocannabinol: NEGATIVE

## 2024-12-14 LAB — ETHANOL: Alcohol, Ethyl (B): <15 mg/dL

## 2024-12-14 NOTE — ED Triage Notes (Signed)
 Patient reports increasing auditory hallucinations this week , denies SI or HI , calm and cooperative .

## 2024-12-15 ENCOUNTER — Ambulatory Visit (HOSPITAL_COMMUNITY): Admission: EM | Admit: 2024-12-15 | Discharge: 2024-12-15 | Discharge status: Against Medical Advice

## 2024-12-15 ENCOUNTER — Ambulatory Visit (HOSPITAL_COMMUNITY): Admission: EM | Admit: 2024-12-15 | Discharge: 2024-12-15 | Disposition: A | Discharge status: Home / Self Care

## 2024-12-15 DIAGNOSIS — Z765 Malingerer [conscious simulation]: Secondary | ICD-10-CM

## 2024-12-15 NOTE — Progress Notes (Signed)
" °   12/15/24 1307  [facility] Triage Screening (Walk-ins at [facility] only)  How Did You Hear About Us ? Self  What Is the Reason for Your Visit/Call Today? URGENT. Nation Cradle 49 y/o that presents to the [facility], voluntarily. He has a self reported history of Schizophrenia and alcohol abuse. He presents with a complaint of alcohol use. He started using alcohol (liquor) at the age of 48 years old, reporting daily use, (#2) 1/2 gallons per day for many years, last use was 2am this morning (1/2) pint. Patient reports withdrawal symptoms-sweats, hot flashes, diarrhea, and vomiting. Denies drug use. Denies hx of seizures. However, reports a hx of DT's. He has participated in detox on 6 occasions in the past at [facility]). He does not recall his last detox admission. Denies SI, HI, and AVH. States that he is prescribed psychiatric medications and compliant. He is homeless. States he is interested in detox only. He states that he has no interest in any further treatment.  How Long Has This Been Causing You Problems? > than 6 months  Have You Recently Had Any Thoughts About Hurting Yourself? No  Are You Planning to Commit Suicide/Harm Yourself At This time? No  Have you Recently Had Thoughts About Hurting Someone Sherral? No  Are You Planning To Harm Someone At This Time? No  Physical Abuse Denies  Verbal Abuse Denies  Sexual Abuse Denies  Exploitation of patient/patient's resources Denies  Self-Neglect Denies  Are you currently experiencing any auditory, visual or other hallucinations? No  Have You Used Any Alcohol or Drugs in the Past 24 Hours? Yes  What Did You Use and How Much? This morning patient reports drinking  Do you have any current medical co-morbidities that require immediate attention? No  Clinician description of patient physical appearance/behavior: Patient appears intoxicated.  What Do You Feel Would Help You the Most Today? Treatment for Depression or other mood problem  If access to [facility]  Urgent Care was not available, would you have sought care in the Emergency Department? No  Determination of Need Urgent (48 hours)  Options For Referral Medication Management;Chemical Dependency Intensive Outpatient Therapy (CDIOP)  Determination of Need filed? Yes    "

## 2024-12-15 NOTE — ED Notes (Signed)
 Pt walked to bathroom 1 hour ago. Pt not seen since.

## 2024-12-15 NOTE — ED Provider Notes (Cosign Needed Addendum)
 Behavioral Health Urgent Care Medical Screening Exam  Patient Name: Cole Henderson MRN: [id number] Date of Evaluation: 12/15/2024 Chief Complaint:   Diagnosis:  Final diagnoses:  Malingering    History of Present illness: Cole Henderson is a 49 y.o. male with a known history of schizoaffective disorder (bipolar type), alcohol use disorder, polysubstance abuse, malingering, HTN, and HLD who presents to urgent care for the second time today, after leaving this urgent care earlier and also presenting to another ED at~2AM this morning. He reports last alcohol use at approximately 0200 and states he wants to detox, though he is unable to articulate any clear withdrawal symptoms or motivation for treatment.  He denies history of alcohol withdrawal associated seizures, reports a history of DTs his primary focus throughout the encounter is on obtaining housing and food, repeatedly stating he is starving; he was provided a sandwich during the visit. He expresses apparent secondary gain, requesting admission primarily for shelter, and is very specific about conditions of admission, including requesting a maximum 2-day stay, refusing psychotropic medications, and stating he would not require medications for detox and would just need to rest.  He also is not interested in recommendation for residential rehabilitation programs.  On exam, he has poor dentition and slurred speech, but does not appear acutely intoxicated or in alcohol withdrawal. He denies suicidal ideation, homicidal ideation, hallucinations, paranoia, or delusions. There is no evidence of psychosis, mania, or acute mood instability on evaluation. His thought process appears goal-directed toward external needs rather than treatment. When offered CDIOP services and homeless shelter resources, he became increasingly irritable and upset, stating, You cannot help me, I cannot be homeless anymore. Overall presentation is notable for lack of objective  detox needs, refusal of recommended treatment options, and high suspicion for malingering with prominent secondary gain, without evidence of acute psychiatric decompensation or imminent safety risk at this time.   The evaluation was conducted in the lobby, and the patient became frustrated and left the facility prior to the provider being able to review or provide the AVS.   Flowsheet Row ED from 12/14/2024 in [facility] Emergency Department at [facility] ED from 02/26/2024 in [facility] Emergency Department at [facility] ED from 02/25/2024 in [facility] Emergency Department at [facility]  C-SSRS RISK CATEGORY No Risk No Risk No Risk    Psychiatric Specialty Exam  Presentation  General Appearance: Disheveled, odorous Eye Contact:Good  Speech:Clear and Coherent; Normal Rate  Speech Volume:Normal    Mood and Affect  Mood: I hate being homeless  Affect: Irritable, full range   Thought Process  Thought Processes: Linear; goal-directed  Descriptions of Associations:Intact  Orientation:None  Thought Content:Logical  Hallucinations: Denies Ideas of Reference: Denies  Suicidal Thoughts:No  Homicidal Thoughts:No   Sensorium  Memory: Grossly intact  Judgment: Poor Insight: Poor   Executive Functions  Concentration: Good  Attention Span: Good  Recall: Good  Fund of Knowledge: Good  Language: Good   Psychomotor Activity  Psychomotor Activity: Normal   Assets  Assets: Desire for Improvement; Resilience; Communication Skills   Sleep  Sleep: Fair   Physical Exam: Physical Exam Constitutional:      General: He is not in acute distress.    Appearance: He is not ill-appearing.  HENT:     Head: Normocephalic and atraumatic.  Eyes:     Extraocular Movements: Extraocular movements intact.     Conjunctiva/sclera: Conjunctivae normal.  Pulmonary:     Effort: Pulmonary effort is normal. No  respiratory distress.   Musculoskeletal:        General: Normal range of motion.  Neurological:     General: No focal deficit present.    Review of Systems  All other systems reviewed and are negative.  Blood pressure (!) 140/82, pulse 86, temperature 98.7 F (37.1 C), temperature source Oral, resp. rate 18, SpO2 100%. There is no height or weight on file to calculate BMI.    [facility] MSE Discharge Disposition for Follow up and Recommendations: Based on my evaluation the patient does not appear to have an emergency medical condition and can be discharged with resources and follow up care in outpatient services for Substance Abuse Intensive Outpatient Program -- Additional resources provided for [facility], food pantry's, and additional outpatient counseling/substance abuse programs.   Marlo Masson, MD 12/15/2024, 7:59 PM

## 2024-12-15 NOTE — Progress Notes (Signed)
" °   12/15/24 1713  BHUC Triage Screening (Walk-ins at [facility] only)  What Is the Reason for Your Visit/Call Today? Cole Henderson 49 y/o that re-presents to the [facility], voluntarily. He was seen earlier, however, left abruptly, and walked off the premises. Upon returning, he shares that he went looking for money to buy alcohol. However, no one would give him money so he decided to come back. He has a self reported history of Schizophrenia and alcohol abuse. When asked what brings him here he says, I need to detox and food, I haven't eaten since Sunday, I've just been drinking. He presents with a complaint of alcohol abuse. He started using alcohol (liquor) at the age of 49 years old, reporting daily use, (#2) 1/2 gallons per day for many years, last use was 2am this morning (1/2) pint. Patient reports withdrawal symptoms-sweats, hot flashes, diarrhea, and vomiting. Denies drug use. Denies hx of seizures. However, reports a hx of DT's. He has participated in detox on 6 occasions in the past at [facility]). He does not recall his last detox admission. Denies SI, HI, and AVH. States that he is prescribed psychiatric medications and compliant. He is homeless. States he is interested in detox only, for 3 days. He states that he has no interest in any further treatment.  How Long Has This Been Causing You Problems? > than 6 months  Have You Recently Had Any Thoughts About Hurting Yourself? No  Are You Planning to Commit Suicide/Harm Yourself At This time? No  Have you Recently Had Thoughts About Hurting Someone [place]? No  Are You Planning To Harm Someone At This Time? No  Physical Abuse Denies  Verbal Abuse Denies  Sexual Abuse Denies  Exploitation of patient/patient's resources Denies  Self-Neglect Denies  Are you currently experiencing any auditory, visual or other hallucinations? No  Have You Used Any Alcohol or Drugs in the Past 24 Hours? Yes  What Did You Use and How Much? This morning, 2am,  patient reports drinking alcohol.  Do you have any current medical co-morbidities that require immediate attention? No  Clinician description of patient physical appearance/behavior: Patient appears intoxicated, drowsy.  What Do You Feel Would Help You the Most Today? Treatment for Depression or other mood problem;Medication(s);Food Assistance;Housing Assistance;Alcohol or Drug Use Treatment  If access to [facility] was not available, would you have sought care in the Emergency Department? No  Determination of Need Urgent (48 hours)  Options For Referral Medication Management;Chemical Dependency Intensive Outpatient Therapy (CDIOP);Facility-Based Crisis  Determination of Need filed? Yes    "

## 2024-12-15 NOTE — Discharge Instructions (Signed)
Substance Abuse Resources  [facility] - Admissions are currently completed Monday through Friday at [place]; both appointments and walk-ins are accepted.  Any individual that is a [facility] resident may present for a substance abuse screening and assessment for admission.  A person may be referred by numerous sources or self-refer.   Potential clients will be screened for medical necessity and appropriateness for the program.  Clients must meet criteria for high-intensity residential treatment services.  If clinically appropriate, a client will continue with the comprehensive clinical assessment and intake process, as well as enrollment in the [place].   Address: [address] [place], [place] [address] Admin Hours: Mon-Fri 8AM to [place] Hours: 24/7 Phone: [phone] Fax: [phone]   [facility] (Detox) Facility Based Crisis:  These are 3 locations for services: Please call before arrival    Address: [address] [place], [place] [address] Phone: [phone]   Address: [address] [place],  [place] [address] Phone#: [phone]   Address: [address] [place], [place] [address] Phone#: [phone]     [facility] ([facility]): (offers outpatient therapy and intensive outpatient substance abuse therapy).  [address] [place], [place] [address] Phone: [phone]   [facility]: Offers FREE recovery skills classes, support groups, 1:1 Peer Support, and Compeer Classes. [address] [place], [place] [address] Phone: [phone] (Call to complete intake).  [facility] [address] [place], [place] [address] Phone: [phone] ext: [facility] provides food, shelter and other programs and services to the homeless men of [place] through our [employer].   By offering safe shelter, three meals a day,  clean clothing, Biblical counseling, financial planning, vocational training, GED/education and employment assistance, we've helped mend the shattered lives of many homeless men since opening in 1974.   We have approximately 267 beds available, with a max of 312 beds including mats for emergency situations and currently house an average of 270 men a night.   Prospective Client Check-In Information Photo ID Required (State/ Out of State/ [facility] [facility] [place]) - if photo ID is not available, clients are required to have a printout of a police/sheriff's criminal history report. Help out with chores around the [place]. No sex offender of any type (pending, charged, registered and/or any other sex related offenses) will be permitted to check in. Must be willing to abide by all rules, regulations, and policies established by the [employer]. The following will be provided - shelter, food, clothing, and biblical counseling. If you or someone you know is in need of assistance at our [facility] shelter in [place], [place], please call [phone] ext. [phone].   [facility]-will provide timely access to mental health services for children and adolescents (4-17) and adults presenting in a mental health crisis. The program is designed for those who need urgent Behavioral Health or Substance Use treatment and are not experiencing a medical crisis that would typically require an emergency room visit.    [place], [place] [address] Phone: [phone] [facility]: Phone#: [phone]   The [facility] SA Intensive Outpatient Program (SAIOP) means structured individual and group addiction activities and services that are provided at an outpatient program designed to assist adult and adolescent consumers to begin recovery and learn skills for recovery maintenance. The [place] program is offered at  least 3 hours a  day, 3 days a week.SAIOP services shall include a structured program consisting of, but not limited to, the following services: Individual counseling and support; Group counseling and support; Family counseling, training or support; Biochemical assays to identify recent drug use (e.g., urine drug screens); Strategies for relapse prevention to include community and social support systems in treatment; Life skills; Crisis contingency planning; Disease Management; and Treatment support activities that have been adapted or specifically designed for persons with physical disabilities, or persons with co-occurring disorders of mental illness and substance abuse/dependence or mental retardation/developmental disability and substance abuse/dependence. Phone: [place] [address] [place], [place] [address] Phone: [phone] Admissions team is available 24/7  Phone: [phone]. Fax: [phone]   [facility]     Admissions [address] [place], [place] [address] [phone]    [facility] [place]: [phone]  Behavioral Health Crisis Line: [phone]

## 2024-12-15 NOTE — ED Provider Notes (Addendum)
 Patient abruptly left waiting room without being evaluated.  Prentice Espy, MD Psychiatry 12/15/2024 1:30 PM

## 2024-12-15 NOTE — ED Provider Notes (Signed)
 Per triage and provider patient left the waiting room before being evaluated.

## 2024-12-15 NOTE — ED Notes (Signed)
 Unable to locate patient at ER multiple times by staff .

## 2024-12-15 NOTE — ED Provider Notes (Signed)
 I went to evaluate the patient and he had eloped.  Patient was not evaluated.   Haze Lonni PARAS, MD 12/15/24 [phone]

## 2024-12-15 NOTE — BH Assessment (Incomplete)
 SABRA

## 2024-12-18 NOTE — ED Provider Notes (Signed)
 Patient placed in [facility] pathway, seen and evaluated for chief complaint of painful knot to his neck. Present x 10 days. Also here requesting to be placed back on his meds (Thorazine and Artane ?) - been off x 2 months. Pertinent exam findings include ambulatory, no acute distress.   Patient/parent counseled on process, plan, and necessity for staying for completing the evaluation.      Note By: Metta Lines, PA-C 11:00 AM   Emergency Department Provider Note  Dragon voice dictation used for charting.    Provider at bedside: 12:03 PM  History obtained from the: Patient  History   Chief Complaint  Patient presents with   Abscess     HPI  Cole Henderson is a 49 y.o. male who presents to the ED with multiple complaints.  Patient endorses 10 days of a painful knot to the back of his neck.  He denies any known injuries to the area.  He has not had any fevers.  Patient would also like to be placed back onto Thorazine and Artane .  He notes that he has been off these medications for 2 months and does not have a primary care provider or psychiatric provider in the area.  He denies any SI/HI/AH/VH.  Patient also states he has not eaten in 2 days would like two sandwich boxes    No LMP for male patient.   Past Medical History Medical History[1]  Past Surgical History Surgical History[2]    Allergies Allergies[3]   Family History Family History[4]   Social History Social History[5]    Physical Exam   Vitals:   12/18/24 1049 12/18/24 1155 12/18/24 1200  BP: (!) 168/99 (!) 159/98 139/82  BP Location: Right arm    Patient Position: Sitting    Pulse: 87 97 83  Resp: 17    Temp: 97.6 F (36.4 C)    TempSrc: Oral    SpO2: 98% 100% 100%  Weight: 89.4 kg (197 lb)    Height: 177.8 cm (5' 10)      Physical Exam Vitals and nursing note reviewed.  Constitutional:      General: He is not in acute distress.    Appearance: He is well-developed. He is not  toxic-appearing.  HENT:     Head: Normocephalic and atraumatic.     Right Ear: External ear normal.     Left Ear: External ear normal.  Eyes:     General: Lids are normal.     Conjunctiva/sclera: Conjunctivae normal.  Cardiovascular:     Rate and Rhythm: Normal rate and regular rhythm.     Pulses: Normal pulses.     Heart sounds: Normal heart sounds.  Pulmonary:     Effort: Pulmonary effort is normal.     Breath sounds: Normal breath sounds.  Musculoskeletal:     Cervical back: Normal range of motion and neck supple. Tenderness (Right sided paraspinal TTP) present.  Neurological:     Mental Status: He is alert.  Psychiatric:        Mood and Affect: Mood normal.        Behavior: Behavior normal.     Labs   Lab Results (last 24 hours)     ** No results found for the last 24 hours. **         Radiology   Radiology Results (last 72 hours)     ** No results found for the last 72 hours. **        EKG  No results found for this visit on 12/18/24.   ED Course      Procedure Note   Procedures  Medical Decision Making   Clinical Complexity  Patient's presentation is most consistent with acute illness / injury with systematic symptoms.    Provider time spent in patient care today, inclusive of but not limited to clinical reassessment, review of diagnostic studies, and discharge preparation, was greater than 30 minutes.    Medical Decision Making Problems Addressed: Neck pain: complicated acute illness or injury  Risk Prescription drug management.     Differential diagnosis includes but is not limited to strain, sprain, fracture, contusion, dislocation, abscess, cellulitis, medication refill  ED Clinical Impression   1. Neck pain      ED Assessment/Plan  Patient presented to the ED with multiple complaints.  On exam, I did not note any findings concerning for abscess to the neck.  Patient is tender to palpation to the right paraspinal  region and has some pain with range of motion, so he was given ibuprofen  in the ED.  He denies any injuries, so do not feel imaging is needed.  Additionally, patient would like refills of his psychiatric medications.  I explained that I was not comfortable resuming these medications from the emergency department as I have no follow-up with him.  He was provided with a list of local mental health resources for follow-up.  Patient was also given something to eat while in the ED.  Strict return precautions were discussed and he was given the rest of his discharge instructions.  He stated that he understood and had no further questions.  Patient was discharged in stable condition  ED Meds Given During Visit Medications  ibuprofen  (MOTRIN ) tablet 800 mg (800 mg oral Given 12/18/24 1243)      New Prescriptions   No medications on file      FOLLOW UP [facility] -  EMERGENCY DEPARTMENT [address] [employer] [place]  [address] [phone]  As needed  [facility] - Family Medicine at [place] [address] [place]  [phone]  As needed to establish a PCP   Electronically signed by: 12:03 PM 12/18/2024 for Allyson Schlagheck PA-C        [1] Past Medical History: Diagnosis Date   Addiction to drug    (CMD)    Alcohol withdrawal seizure    (CMD)    Antisocial personality disorder    (CMD)    Depression    Difficulty controlling anger    ETOH abuse    Hallucinations 01/20/2020   Homeless    Hypertension    Manic depression    (CMD)    Medically noncompliant    Psychiatric illness    Schizoaffective disorder    (CMD)    Schizophrenia    (CMD)    Sleep difficulties    Substance abuse (CMD)   [2] Past Surgical History: Procedure Laterality Date   KNEE SURGERY Left    Procedure: KNEE SURGERY   SHOULDER SURGERY Right    Procedure: SHOULDER  SURGERY  [3] Allergies Allergen Reactions   Fish Containing Products Anaphylaxis   Shellfish Containing Products Anaphylaxis   Haloperidol  GI Intolerance   Strawberry Extract Hives   Ziprasidone  Diarrhea    Severe diarrehea   Benztropine Rash   Cephalexin Rash   Ziprasidone  Hcl Rash  [4] Family History Problem Relation Name Age of Onset  Diabetes Mother     Cancer Maternal Grandmother    [5] Social History Tobacco Use   Smoking status: Every Day    Current packs/day: 2.00    Types: Cigarettes   Smokeless tobacco: Never  Vaping Use   Vaping status: Never Used  Substance Use Topics   Alcohol use: Yes    Comment: up to half gallon of [place] Whiskey by history from last evaluation at [facility], states that he has not drank in 45 days   Drug use: Yes    Types: Methamphetamines, Marijuana, Amphetamines    Comment: 45 days

## 2024-12-19 ENCOUNTER — Emergency Department (HOSPITAL_COMMUNITY)

## 2024-12-19 ENCOUNTER — Encounter (HOSPITAL_COMMUNITY): Payer: Self-pay

## 2024-12-19 ENCOUNTER — Emergency Department (HOSPITAL_COMMUNITY)
Admission: EM | Admit: 2024-12-19 | Discharge: 2024-12-19 | Disposition: A | Discharge status: Home / Self Care | Attending: Emergency Medicine | Admitting: Emergency Medicine

## 2024-12-19 DIAGNOSIS — R479 Unspecified speech disturbances: Secondary | ICD-10-CM | POA: Diagnosis present

## 2024-12-19 DIAGNOSIS — R200 Anesthesia of skin: Secondary | ICD-10-CM | POA: Insufficient documentation

## 2024-12-19 DIAGNOSIS — R531 Weakness: Secondary | ICD-10-CM | POA: Diagnosis not present

## 2024-12-19 DIAGNOSIS — I10 Essential (primary) hypertension: Secondary | ICD-10-CM | POA: Diagnosis not present

## 2024-12-19 DIAGNOSIS — I639 Cerebral infarction, unspecified: Secondary | ICD-10-CM

## 2024-12-19 LAB — CBC
HCT: 34.1 % — ABNORMAL LOW (ref 39.0–52.0)
Hemoglobin: 10.9 g/dL — ABNORMAL LOW (ref 13.0–17.0)
MCH: 28.9 pg (ref 26.0–34.0)
MCHC: 32 g/dL (ref 30.0–36.0)
MCV: 90.5 fL (ref 80.0–100.0)
Platelets: 241 K/uL (ref 150–400)
RBC: 3.77 MIL/uL — ABNORMAL LOW (ref 4.22–5.81)
RDW: 13.5 % (ref 11.5–15.5)
WBC: 6.7 K/uL (ref 4.0–10.5)
nRBC: 0 % (ref 0.0–0.2)

## 2024-12-19 LAB — DIFFERENTIAL
Abs Immature Granulocytes: 0.02 K/uL (ref 0.00–0.07)
Basophils Absolute: 0 K/uL (ref 0.0–0.1)
Basophils Relative: 0 %
Eosinophils Absolute: 0.4 K/uL (ref 0.0–0.5)
Eosinophils Relative: 6 %
Immature Granulocytes: 0 %
Lymphocytes Relative: 25 %
Lymphs Abs: 1.7 K/uL (ref 0.7–4.0)
Monocytes Absolute: 0.6 K/uL (ref 0.1–1.0)
Monocytes Relative: 10 %
Neutro Abs: 3.9 K/uL (ref 1.7–7.7)
Neutrophils Relative %: 59 %

## 2024-12-19 LAB — COMPREHENSIVE METABOLIC PANEL WITH GFR
ALT: 37 U/L (ref 0–44)
AST: 49 U/L — ABNORMAL HIGH (ref 15–41)
Albumin: 3.6 g/dL (ref 3.5–5.0)
Alkaline Phosphatase: 57 U/L (ref 38–126)
Anion gap: 9 (ref 5–15)
BUN: 14 mg/dL (ref 6–20)
CO2: 26 mmol/L (ref 22–32)
Calcium: 8.6 mg/dL — ABNORMAL LOW (ref 8.9–10.3)
Chloride: 108 mmol/L (ref 98–111)
Creatinine, Ser: 0.68 mg/dL (ref 0.61–1.24)
GFR, Estimated: >60 mL/min
Glucose, Bld: 95 mg/dL (ref 70–99)
Potassium: 4.1 mmol/L (ref 3.5–5.1)
Sodium: 143 mmol/L (ref 135–145)
Total Bilirubin: 0.3 mg/dL (ref 0.0–1.2)
Total Protein: 6.1 g/dL — ABNORMAL LOW (ref 6.5–8.1)

## 2024-12-19 LAB — URINALYSIS, ROUTINE W REFLEX MICROSCOPIC
Bilirubin Urine: NEGATIVE
Glucose, UA: NEGATIVE mg/dL
Hgb urine dipstick: NEGATIVE
Ketones, ur: NEGATIVE mg/dL
Leukocytes,Ua: NEGATIVE
Nitrite: NEGATIVE
Protein, ur: NEGATIVE mg/dL
Specific Gravity, Urine: 1.031 — ABNORMAL HIGH (ref 1.005–1.030)
pH: 7 (ref 5.0–8.0)

## 2024-12-19 LAB — URINE DRUG SCREEN
Amphetamines: NEGATIVE
Barbiturates: NEGATIVE
Benzodiazepines: NEGATIVE
Cocaine: NEGATIVE
Fentanyl: NEGATIVE
Methadone Scn, Ur: NEGATIVE
Opiates: NEGATIVE
Tetrahydrocannabinol: NEGATIVE

## 2024-12-19 LAB — ETHANOL: Alcohol, Ethyl (B): <15 mg/dL

## 2024-12-19 LAB — CBG MONITORING, ED: Glucose-Capillary: 77 mg/dL (ref 70–99)

## 2024-12-19 LAB — PROTIME-INR
INR: 0.9 (ref 0.8–1.2)
Prothrombin Time: 12.6 s (ref 11.4–15.2)

## 2024-12-19 LAB — APTT: aPTT: 24 s (ref 24–36)

## 2024-12-19 MED ORDER — IOHEXOL 350 MG/ML SOLN
75.0000 mL | Freq: Once | INTRAVENOUS | Status: AC | PRN
  Administered 2024-12-19: 75 mL via INTRAVENOUS

## 2024-12-19 MED ORDER — GADOBUTROL 1 MMOL/ML IV SOLN
10.0000 mL | Freq: Once | INTRAVENOUS | Status: AC | PRN
  Administered 2024-12-19: 10 mL via INTRAVENOUS

## 2024-12-19 MED ORDER — LISINOPRIL 10 MG PO TABS: 10.0000 mg | ORAL_TABLET | Freq: Every day | ORAL | 1 refills | Status: AC

## 2024-12-19 MED ORDER — CLOPIDOGREL BISULFATE 75 MG PO TABS: 75.0000 mg | ORAL_TABLET | Freq: Every day | ORAL | 0 refills | Status: AC

## 2024-12-19 MED ORDER — CLOPIDOGREL BISULFATE 300 MG PO TABS
300.0000 mg | ORAL_TABLET | Freq: Once | ORAL | Status: AC
  Administered 2024-12-19: 300 mg via ORAL
  Filled 2024-12-19: qty 1

## 2024-12-19 MED ORDER — ASPIRIN 81 MG PO CHEW
81.0000 mg | CHEWABLE_TABLET | Freq: Once | ORAL | Status: AC
  Administered 2024-12-19: 81 mg via ORAL
  Filled 2024-12-19: qty 1

## 2024-12-19 MED ORDER — LORAZEPAM 2 MG/ML IJ SOLN: 1.0000 mg | Freq: Once | INTRAMUSCULAR | Status: DC | PRN

## 2024-12-19 NOTE — ED Notes (Signed)
 Patient was given something to eat and tolerated it well. Requested a nicotine  patch. Denies pain at this time.

## 2024-12-19 NOTE — Discharge Instructions (Addendum)
 You were seen in the seated [place] for you are seen in the emergency department for slurred speech and left-sided numbness.  Your evaluation is reassuring.  We are starting you on 2 blood thinners.  1 is a baby aspirin  which you can obtain over-the-counter and you should take 1 daily.  The other is Plavix  which we are prescribing.  We are restarting your lisinopril .  You are being referred to neurology and they will call you for an appointment.  Please stop smoking.

## 2024-12-19 NOTE — ED Triage Notes (Addendum)
 Pt presents with c/o left side hand swelling and left side leg swelling and left side facial swelling that started with he woke up this morning. Pt also has some slurred speech and a slight facial droop noted on the right side. Pt is able to answer questions. Dr. Nanavati at bedside to evaluate pt at this time.

## 2024-12-19 NOTE — ED Provider Notes (Signed)
 49 yo male with h/o HTN off meds, h/o using crack and methamphetamine off for past few weeks, presents with slurred speech and left arm numbness since waking up.  He seems to have had a remote stroke and recalls having left-sided weakness and slurred speech in the past but he is unclear of the details.  He does feel like he went to sleep with clear speech. No other sxs.  No recent fevers, chills, falls, any other concerns.  Thorazine artane  trazodone  are his only current medicines.  Physical Exam  BP (!) 157/95   Pulse 84   Temp 97.9 F (36.6 C) (Oral)   Resp 17   SpO2 100%   Physical Exam  Constitutional: Patient appears well-developed and well-nourished. No distress.  HENT:  Head: Normocephalic and atraumatic.  Mouth/Throat: Oropharynx is clear and moist. No oropharyngeal exudate.  Eyes: Conjunctivae are normal. Pupils are equal, round, and reactive to light. Neck: Normal range of motion. Neck supple.  Cardiovascular: Normal rate, regular rhythm, normal heart sounds and intact distal pulses.   Pulmonary/Chest: Effort normal and breath sounds normal. No respiratory distress. No wheezes or rales.  Abdominal: Soft. Bowel sounds are normal. No distension or tenderness.  Musculoskeletal: Normal range of motion. No edema or tenderness.  Neurological: Patient is alert and oriented.  Slight left facial droop.  Slurred speech.  Normal strength.  Decreased sensation left face and arm. Normal coordination. Skin: Skin is warm and dry. No diaphoresis.  Psychiatric: Normal mood and affect. Normal behavior. Judgment and thought content normal.  Nursing note and vitals reviewed.   Labs Reviewed  CBC - Abnormal; Notable for the following components:      Result Value   RBC 3.77 (*)    Hemoglobin 10.9 (*)    HCT 34.1 (*)    All other components within normal limits  COMPREHENSIVE METABOLIC PANEL WITH GFR - Abnormal; Notable for the following components:   Calcium 8.6 (*)    Total Protein 6.1  (*)    AST 49 (*)    All other components within normal limits  PROTIME-INR  APTT  DIFFERENTIAL  ETHANOL  URINE DRUG SCREEN  CBG MONITORING, ED    MR Angiogram Neck W or Wo Contrast  Final Result    CT ANGIO HEAD NECK W WO CM  Final Result    MR BRAIN WO CONTRAST  Final Result       ED Course / MDM    Medical Decision Making Amount and/or Complexity of Data Reviewed Labs: ordered. Radiology: ordered.  Risk Prescription drug management.   Patient is feeling slightly better.  Imaging reviewed and discussed with Dr. Michaela from neurology.  He feels that this is consistent with recrudescence symptoms from previous stroke.  Patient is a poor historian and was not aware that he had had a stroke but does recall having similar symptoms in the past.  Denies any recent fevers, chills, cough, vomiting, diarrhea, urinary symptoms, sleep deprivation, trauma, any other inciting factors.  No obvious cause for symptoms today.  Could be related to uncontrolled hypertension and I will restart his lisinopril .  Also needs dual antiplatelet therapy per neurology.  Also advised to stop smoking and continue abstinence from recreational drug use.  This is open reviewed with patient's daughter Geofm as well and they are both happy with plan.  Will refer to outpatient neurology.  Careful return precautions given.       Leiliana Foody Hima, MD 12/19/24 [id number]

## 2024-12-19 NOTE — ED Notes (Signed)
 Assumed care of patient.
